# Patient Record
Sex: Female | Born: 1935 | Race: Black or African American | Hispanic: No | Marital: Single | Smoking: Never smoker
Health system: Southern US, Community
[De-identification: ages and names within clinical notes are randomized; demographics above are authoritative.]

## PROBLEM LIST (undated history)

## (undated) DIAGNOSIS — M1990 Unspecified osteoarthritis, unspecified site: Secondary | ICD-10-CM

## (undated) DIAGNOSIS — R634 Abnormal weight loss: Secondary | ICD-10-CM

## (undated) DIAGNOSIS — T8859XA Other complications of anesthesia, initial encounter: Secondary | ICD-10-CM

## (undated) DIAGNOSIS — N179 Acute kidney failure, unspecified: Secondary | ICD-10-CM

## (undated) DIAGNOSIS — R432 Parageusia: Secondary | ICD-10-CM

## (undated) DIAGNOSIS — IMO0001 Reserved for inherently not codable concepts without codable children: Secondary | ICD-10-CM

## (undated) DIAGNOSIS — T4145XA Adverse effect of unspecified anesthetic, initial encounter: Secondary | ICD-10-CM

## (undated) DIAGNOSIS — C539 Malignant neoplasm of cervix uteri, unspecified: Secondary | ICD-10-CM

## (undated) DIAGNOSIS — I10 Essential (primary) hypertension: Secondary | ICD-10-CM

## (undated) DIAGNOSIS — N2881 Hypertrophy of kidney: Secondary | ICD-10-CM

## (undated) DIAGNOSIS — K439 Ventral hernia without obstruction or gangrene: Secondary | ICD-10-CM

## (undated) HISTORY — DX: Malignant neoplasm of cervix uteri, unspecified: C53.9

## (undated) HISTORY — PX: TONSILLECTOMY: SUR1361

## (undated) HISTORY — DX: Parageusia: R43.2

## (undated) HISTORY — DX: Abnormal weight loss: R63.4

---

## 2006-09-07 ENCOUNTER — Emergency Department: Payer: Self-pay | Admitting: Emergency Medicine

## 2006-09-07 ENCOUNTER — Other Ambulatory Visit: Payer: Self-pay

## 2006-11-26 ENCOUNTER — Ambulatory Visit: Payer: Self-pay | Admitting: Family Medicine

## 2007-12-09 ENCOUNTER — Ambulatory Visit: Payer: Self-pay | Admitting: Family Medicine

## 2008-10-08 ENCOUNTER — Inpatient Hospital Stay: Payer: Self-pay | Admitting: Internal Medicine

## 2008-12-12 ENCOUNTER — Ambulatory Visit: Payer: Self-pay | Admitting: Family Medicine

## 2009-03-29 ENCOUNTER — Ambulatory Visit: Payer: Self-pay | Admitting: Family Medicine

## 2009-12-31 ENCOUNTER — Ambulatory Visit: Payer: Self-pay | Admitting: Family Medicine

## 2010-01-28 ENCOUNTER — Emergency Department: Payer: Self-pay | Admitting: Emergency Medicine

## 2010-06-12 ENCOUNTER — Ambulatory Visit: Payer: Self-pay | Admitting: Family Medicine

## 2011-03-19 ENCOUNTER — Ambulatory Visit: Payer: Self-pay | Admitting: Family Medicine

## 2012-04-15 ENCOUNTER — Ambulatory Visit: Payer: Self-pay | Admitting: Family Medicine

## 2013-08-17 ENCOUNTER — Ambulatory Visit: Payer: Self-pay | Admitting: Family Medicine

## 2014-04-21 DIAGNOSIS — R9431 Abnormal electrocardiogram [ECG] [EKG]: Secondary | ICD-10-CM | POA: Insufficient documentation

## 2014-04-21 DIAGNOSIS — I10 Essential (primary) hypertension: Secondary | ICD-10-CM | POA: Insufficient documentation

## 2014-04-21 DIAGNOSIS — R0681 Apnea, not elsewhere classified: Secondary | ICD-10-CM | POA: Insufficient documentation

## 2014-08-21 ENCOUNTER — Ambulatory Visit: Payer: Self-pay | Admitting: Internal Medicine

## 2014-09-09 ENCOUNTER — Encounter (HOSPITAL_COMMUNITY): Payer: Self-pay | Admitting: Emergency Medicine

## 2014-09-09 ENCOUNTER — Emergency Department (HOSPITAL_COMMUNITY)
Admission: EM | Admit: 2014-09-09 | Discharge: 2014-09-09 | Disposition: A | Discharge status: Home / Self Care | Payer: No Typology Code available for payment source | Attending: Emergency Medicine | Admitting: Emergency Medicine

## 2014-09-09 ENCOUNTER — Emergency Department (HOSPITAL_COMMUNITY): Payer: No Typology Code available for payment source

## 2014-09-09 DIAGNOSIS — Z79899 Other long term (current) drug therapy: Secondary | ICD-10-CM | POA: Diagnosis not present

## 2014-09-09 DIAGNOSIS — Y998 Other external cause status: Secondary | ICD-10-CM | POA: Diagnosis not present

## 2014-09-09 DIAGNOSIS — Z043 Encounter for examination and observation following other accident: Secondary | ICD-10-CM | POA: Diagnosis present

## 2014-09-09 DIAGNOSIS — Y92410 Unspecified street and highway as the place of occurrence of the external cause: Secondary | ICD-10-CM | POA: Diagnosis not present

## 2014-09-09 DIAGNOSIS — Z7982 Long term (current) use of aspirin: Secondary | ICD-10-CM | POA: Diagnosis not present

## 2014-09-09 DIAGNOSIS — Y9389 Activity, other specified: Secondary | ICD-10-CM | POA: Diagnosis not present

## 2014-09-09 DIAGNOSIS — I10 Essential (primary) hypertension: Secondary | ICD-10-CM | POA: Diagnosis not present

## 2014-09-09 HISTORY — DX: Essential (primary) hypertension: I10

## 2014-09-09 NOTE — ED Notes (Addendum)
Pt not in room to do discharge vitals/instructions. Pt gown on bed, not in restroom.
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
## 2014-09-09 NOTE — Discharge Instructions (Signed)

## 2015-05-14 ENCOUNTER — Emergency Department
Admission: EM | Admit: 2015-05-14 | Discharge: 2015-05-15 | Disposition: A | Discharge status: Home / Self Care | Payer: Medicare Other | Attending: Emergency Medicine | Admitting: Emergency Medicine

## 2015-05-14 ENCOUNTER — Encounter: Payer: Self-pay | Admitting: Emergency Medicine

## 2015-05-14 DIAGNOSIS — I10 Essential (primary) hypertension: Secondary | ICD-10-CM | POA: Diagnosis not present

## 2015-05-14 DIAGNOSIS — Z79899 Other long term (current) drug therapy: Secondary | ICD-10-CM | POA: Insufficient documentation

## 2015-05-14 DIAGNOSIS — M79604 Pain in right leg: Secondary | ICD-10-CM | POA: Diagnosis not present

## 2015-05-14 DIAGNOSIS — Z7982 Long term (current) use of aspirin: Secondary | ICD-10-CM | POA: Diagnosis not present

## 2015-05-14 DIAGNOSIS — R52 Pain, unspecified: Secondary | ICD-10-CM

## 2015-05-14 MED ORDER — KETOROLAC TROMETHAMINE 10 MG PO TABS
10.0000 mg | ORAL_TABLET | Freq: Once | ORAL | Status: AC
  Administered 2015-05-14: 10 mg via ORAL
  Filled 2015-05-14: qty 1

## 2015-05-14 NOTE — ED Notes (Addendum)
Pt to triage via w/c with no distress noted; pt reports having pain from right hip radiating down leg today with no known injury; denies hx of same, denies back pain; +PP, brisk cap refill, W&D, good movem/sensation; swelling not to ankle--but appears bilat; st pain increases with wt bearing

## 2015-05-15 ENCOUNTER — Emergency Department: Payer: Medicare Other

## 2015-05-15 MED ORDER — KETOROLAC TROMETHAMINE 10 MG PO TABS: 10.0000 mg | ORAL_TABLET | Freq: Three times a day (TID) | ORAL | Status: DC | PRN

## 2015-05-15 NOTE — Discharge Instructions (Signed)

## 2015-05-15 NOTE — ED Provider Notes (Signed)
[facility] Emergency Department Provider Note  ____________________________________________  Time seen: 11:40 PM  I have reviewed the triage vital signs and the nursing notes.   HISTORY  Chief Complaint Leg Pain      HPI Tonya Moreno is a 81 y.o. female presents with right hip pain that radiates down posterior leg. Pain is worse with ambulation but does occur with rest. Patient also admits to some linked to her ankle bilaterally. Patient denies any shortness of breath no chest pain.     Past Medical History  Diagnosis Date  . Hypertension     There are no active problems to display for this patient.   History reviewed. No pertinent past surgical history.  Current Outpatient Rx  Name  Route  Sig  Dispense  Refill  . amLODipine (NORVASC) 10 MG tablet   Oral   Take 10 mg by mouth daily.         Marland Kitchen aspirin 81 MG tablet   Oral   Take 81 mg by mouth daily.         . Cholecalciferol 4000 UNITS CAPS   Oral   Take 4,000 Units by mouth daily.         . quinapril (ACCUPRIL) 20 MG tablet   Oral   Take 20 mg by mouth 2 (two) times daily.           Allergies Review of patient's allergies indicates no known allergies.  No family history on file.  Social History History  Substance Use Topics  . Smoking status: Never Smoker   . Smokeless tobacco: Never Used  . Alcohol Use: No    Review of Systems  Constitutional: Negative for fever. Eyes: Negative for visual changes. ENT: Negative for sore throat. Cardiovascular: Negative for chest pain. Respiratory: Negative for shortness of breath. Gastrointestinal: Negative for abdominal pain, vomiting and diarrhea. Genitourinary: Negative for dysuria. Musculoskeletal: Negative for back pain. Positive for right leg pain. Skin: Negative for rash. Neurological: Negative for headaches, focal weakness or numbness.   10-point ROS otherwise  negative.  ____________________________________________   PHYSICAL EXAM:  VITAL SIGNS: ED Triage Vitals  Enc Vitals Group     BP 05/14/15 2214 162/70 mmHg     Pulse Rate 05/14/15 2214 73     Resp --      Temp 05/14/15 2214 98.5 F (36.9 C)     Temp Source 05/14/15 2214 Oral     SpO2 05/14/15 2214 99 %     Weight 05/14/15 2214 170 lb (77.111 kg)     Height 05/14/15 2214 5\' 5"  (1.651 m)     Head Cir --      Peak Flow --      Pain Score 05/14/15 2218 9     Pain Loc --      Pain Edu? --      Excl. in [place]? --      Constitutional: Alert and oriented. Well appearing and in no distress. Eyes: Conjunctivae are normal. PERRL. Normal extraocular movements. ENT   Head: Normocephalic and atraumatic.   Nose: No congestion/rhinnorhea.   Mouth/Throat: Mucous membranes are moist.   Neck: No stridor. Hematological/Lymphatic/Immunilogical: No cervical lymphadenopathy. Cardiovascular: Normal rate, regular rhythm. Normal and symmetric distal pulses are present in all extremities. No murmurs, rubs, or gallops. Respiratory: Normal respiratory effort without tachypnea nor retractions. Breath sounds are clear and equal bilaterally. No wheezes/rales/rhonchi. Gastrointestinal: Soft and nontender. No distention. There is no CVA tenderness. Genitourinary: deferred Musculoskeletal: Nontender  with normal range of motion in all extremities. No joint effusions.  Neurologic:  Normal speech and language. No gross focal neurologic deficits are appreciated. Speech is normal.  Skin:  Skin is warm, dry and intact. No rash noted. Psychiatric: Mood and affect are normal. Speech and behavior are normal. Patient exhibits appropriate insight and judgment.    RADIOLOGY  Right lower extremity ultrasound revealed: IMPRESSION: No evidence of deep venous thrombosis.   Electronically Signed By: Garald Balding M.D. On: 05/15/2015 02:14      Right hip x-ray revealed : IMPRESSION: No  evidence of fracture or dislocation.   Electronically Signed By: Garald Balding M.D. On: 05/15/2015 02:31  ____________________________________________    INITIAL IMPRESSION / ASSESSMENT AND PLAN / ED COURSE  Pertinent labs & imaging results that were available during my care of the patient were reviewed by me and considered in my medical decision making (see chart for details).  Patient received Toradol with resolution of pain.  ____________________________________________   FINAL CLINICAL IMPRESSION(S) / ED DIAGNOSES  Final diagnoses:  Pain  Leg pain, diffuse, right      Gregor Hams, MD 05/15/15 [phone]

## 2015-09-19 ENCOUNTER — Emergency Department
Admission: EM | Admit: 2015-09-19 | Discharge: 2015-09-19 | Disposition: A | Discharge status: Home / Self Care | Payer: Medicare Other | Attending: Emergency Medicine | Admitting: Emergency Medicine

## 2015-09-19 DIAGNOSIS — N3090 Cystitis, unspecified without hematuria: Secondary | ICD-10-CM

## 2015-09-19 DIAGNOSIS — N3091 Cystitis, unspecified with hematuria: Secondary | ICD-10-CM | POA: Diagnosis not present

## 2015-09-19 DIAGNOSIS — R319 Hematuria, unspecified: Secondary | ICD-10-CM | POA: Diagnosis present

## 2015-09-19 DIAGNOSIS — I10 Essential (primary) hypertension: Secondary | ICD-10-CM | POA: Insufficient documentation

## 2015-09-19 LAB — CBC WITH DIFFERENTIAL/PLATELET
BASOS ABS: 0 10*3/uL (ref 0–0.1)
BASOS PCT: 0 %
Eosinophils Absolute: 0.1 10*3/uL (ref 0–0.7)
Eosinophils Relative: 1 %
HEMATOCRIT: 37.1 % (ref 35.0–47.0)
HEMOGLOBIN: 11.9 g/dL — AB (ref 12.0–16.0)
LYMPHS PCT: 28 %
Lymphs Abs: 1.6 10*3/uL (ref 1.0–3.6)
MCH: 28.4 pg (ref 26.0–34.0)
MCHC: 32.1 g/dL (ref 32.0–36.0)
MCV: 88.5 fL (ref 80.0–100.0)
Monocytes Absolute: 0.6 10*3/uL (ref 0.2–0.9)
Monocytes Relative: 12 %
NEUTROS ABS: 3.3 10*3/uL (ref 1.4–6.5)
NEUTROS PCT: 59 %
Platelets: 223 10*3/uL (ref 150–440)
RBC: 4.2 MIL/uL (ref 3.80–5.20)
RDW: 14.4 % (ref 11.5–14.5)
WBC: 5.5 10*3/uL (ref 3.6–11.0)

## 2015-09-19 LAB — URINALYSIS COMPLETE WITH MICROSCOPIC (ARMC ONLY)
BILIRUBIN URINE: NEGATIVE
GLUCOSE, UA: NEGATIVE mg/dL
Nitrite: NEGATIVE
PROTEIN: 30 mg/dL — AB
Specific Gravity, Urine: 1.018 (ref 1.005–1.030)
pH: 6 (ref 5.0–8.0)

## 2015-09-19 LAB — BASIC METABOLIC PANEL
ANION GAP: 6 (ref 5–15)
BUN: 23 mg/dL — ABNORMAL HIGH (ref 6–20)
CO2: 28 mmol/L (ref 22–32)
Calcium: 10.4 mg/dL — ABNORMAL HIGH (ref 8.9–10.3)
Chloride: 105 mmol/L (ref 101–111)
Creatinine, Ser: 1.14 mg/dL — ABNORMAL HIGH (ref 0.44–1.00)
GFR calc non Af Amer: 45 mL/min — ABNORMAL LOW (ref >60)
GFR, EST AFRICAN AMERICAN: 52 mL/min — AB (ref >60)
GLUCOSE: 98 mg/dL (ref 65–99)
POTASSIUM: 3.6 mmol/L (ref 3.5–5.1)
Sodium: 139 mmol/L (ref 135–145)

## 2015-09-19 MED ORDER — SULFAMETHOXAZOLE-TRIMETHOPRIM 800-160 MG PO TABS: 1.0000 | ORAL_TABLET | Freq: Two times a day (BID) | ORAL | Status: DC

## 2015-09-19 MED ORDER — SULFAMETHOXAZOLE-TRIMETHOPRIM 800-160 MG PO TABS
1.0000 | ORAL_TABLET | Freq: Once | ORAL | Status: AC
  Administered 2015-09-19: 1 via ORAL
  Filled 2015-09-19: qty 1

## 2015-09-19 NOTE — ED Notes (Signed)
Pt states she noticed blood in her urine when today x1.. Denies pain or other sx

## 2015-09-19 NOTE — Discharge Instructions (Signed)

## 2015-09-19 NOTE — ED Provider Notes (Signed)
[facility] Emergency Department Provider Note     Time seen: ----------------------------------------- 4:55 PM on 09/19/2015 -----------------------------------------    I have reviewed the triage vital signs and the nursing notes.   HISTORY  Chief Complaint Hematuria    HPI Tonya Moreno is a 81 y.o. female who presents the ER actionin her urine when she urinated today. Patient states was blood when she white, she's not had a history of this, has not had any other recent bleeding episodes. She denies fevers, chills, chest pain, shortness of breath or any other complaints. Patient does not have dysuria or polyuria.   Past Medical History  Diagnosis Date  . Hypertension     There are no active problems to display for this patient.   No past surgical history on file.  Allergies Review of patient's allergies indicates no known allergies.  Social History Social History  Substance Use Topics  . Smoking status: Never Smoker   . Smokeless tobacco: Never Used  . Alcohol Use: No    Review of Systems Constitutional: Negative for fever. Eyes: Negative for visual changes. ENT: Negative for sore throat. Cardiovascular: Negative for chest pain. Respiratory: Negative for shortness of breath. Gastrointestinal: Negative for abdominal pain, vomiting and diarrhea. Genitourinary: Positive for hematuria Musculoskeletal: Negative for back pain. Skin: Negative for rash. Neurological: Negative for headaches, focal weakness or numbness.  10-point ROS otherwise negative.  ____________________________________________   PHYSICAL EXAM:  VITAL SIGNS: ED Triage Vitals  Enc Vitals Group     BP 09/19/15 1641 171/75 mmHg     Pulse Rate 09/19/15 1641 64     Resp 09/19/15 1641 18     Temp 09/19/15 1641 98.6 F (37 C)     Temp Source 09/19/15 1641 Oral     SpO2 09/19/15 1641 100 %     Weight 09/19/15 1641 170 lb (77.111 kg)     Height 09/19/15 1641  5\' 6"  (1.676 m)     Head Cir --      Peak Flow --      Pain Score --      Pain Loc --      Pain Edu? --      Excl. in [place]? --     Constitutional: Alert and oriented. Well appearing and in no distress. Eyes: Conjunctivae are normal. PERRL. Normal extraocular movements. ENT   Head: Normocephalic and atraumatic.   Nose: No congestion/rhinnorhea.   Mouth/Throat: Mucous membranes are moist.   Neck: No stridor. Cardiovascular: Normal rate, regular rhythm. Normal and symmetric distal pulses are present in all extremities. No murmurs, rubs, or gallops. Respiratory: Normal respiratory effort without tachypnea nor retractions. Breath sounds are clear and equal bilaterally. No wheezes/rales/rhonchi. Gastrointestinal: Soft and nontender. No distention. No abdominal bruits.  Musculoskeletal: Nontender with normal range of motion in all extremities. No joint effusions.  No lower extremity tenderness nor edema. Neurologic:  Normal speech and language. No gross focal neurologic deficits are appreciated. Speech is normal. No gait instability. Skin:  Skin is warm, dry and intact. No rash noted. Psychiatric: Mood and affect are normal. Speech and behavior are normal. Patient exhibits appropriate insight and judgment.  ____________________________________________  ED COURSE:  Pertinent labs & imaging results that were available during my care of the patient were reviewed by me and considered in my medical decision making (see chart for details). Patient's in no acute distress, will check basic labs and urine. ____________________________________________    LABS (pertinent positives/negatives)  Labs Reviewed  CBC WITH DIFFERENTIAL/PLATELET - Abnormal; Notable for the following:    Hemoglobin 11.9 (*)    All other components within normal limits  BASIC METABOLIC PANEL - Abnormal; Notable for the following:    BUN 23 (*)    Creatinine, Ser 1.14 (*)    Calcium 10.4 (*)    GFR calc non  Af Amer 45 (*)    GFR calc Af Amer 52 (*)    All other components within normal limits  URINALYSIS COMPLETEWITH MICROSCOPIC ([facility] ONLY) - Abnormal; Notable for the following:    Color, Urine YELLOW (*)    APPearance CLEAR (*)    Ketones, ur TRACE (*)    Hgb urine dipstick 3+ (*)    Protein, ur 30 (*)    Leukocytes, UA 2+ (*)    Bacteria, UA RARE (*)    Squamous Epithelial / LPF 0-5 (*)    All other components within normal limits  URINE CULTURE   ___________________________________________  FINAL ASSESSMENT AND PLAN  Hematuria  Plan: Patient with labs and imaging as dictated above. Patient said no acute distress, likely cause is cystitis from infection. She'll be given Septra any urine culture will be sent. She is stable for outpatient follow-up with her doctor.   Earleen Newport, MD   Earleen Newport, MD 09/19/15 [name]

## 2015-09-22 LAB — URINE CULTURE

## 2015-10-24 ENCOUNTER — Emergency Department: Payer: Medicare Other

## 2015-10-24 ENCOUNTER — Emergency Department
Admission: EM | Admit: 2015-10-24 | Discharge: 2015-10-24 | Disposition: A | Discharge status: Home / Self Care | Payer: Medicare Other | Attending: Emergency Medicine | Admitting: Emergency Medicine

## 2015-10-24 DIAGNOSIS — I10 Essential (primary) hypertension: Secondary | ICD-10-CM | POA: Insufficient documentation

## 2015-10-24 DIAGNOSIS — Z792 Long term (current) use of antibiotics: Secondary | ICD-10-CM | POA: Insufficient documentation

## 2015-10-24 DIAGNOSIS — R319 Hematuria, unspecified: Secondary | ICD-10-CM | POA: Insufficient documentation

## 2015-10-24 DIAGNOSIS — Z7982 Long term (current) use of aspirin: Secondary | ICD-10-CM | POA: Diagnosis not present

## 2015-10-24 DIAGNOSIS — Z79899 Other long term (current) drug therapy: Secondary | ICD-10-CM | POA: Insufficient documentation

## 2015-10-24 LAB — BASIC METABOLIC PANEL
ANION GAP: 8 (ref 5–15)
BUN: 26 mg/dL — ABNORMAL HIGH (ref 6–20)
CALCIUM: 10.7 mg/dL — AB (ref 8.9–10.3)
CO2: 27 mmol/L (ref 22–32)
CREATININE: 1.31 mg/dL — AB (ref 0.44–1.00)
Chloride: 103 mmol/L (ref 101–111)
GFR calc Af Amer: 44 mL/min — ABNORMAL LOW (ref >60)
GFR calc non Af Amer: 38 mL/min — ABNORMAL LOW (ref >60)
GLUCOSE: 128 mg/dL — AB (ref 65–99)
Potassium: 3.9 mmol/L (ref 3.5–5.1)
Sodium: 138 mmol/L (ref 135–145)

## 2015-10-24 LAB — URINALYSIS COMPLETE WITH MICROSCOPIC (ARMC ONLY)
Bacteria, UA: NONE SEEN
Bilirubin Urine: NEGATIVE
GLUCOSE, UA: NEGATIVE mg/dL
Ketones, ur: NEGATIVE mg/dL
Nitrite: NEGATIVE
Protein, ur: 100 mg/dL — AB
SQUAMOUS EPITHELIAL / LPF: NONE SEEN
Specific Gravity, Urine: 1.017 (ref 1.005–1.030)
pH: 5 (ref 5.0–8.0)

## 2015-10-24 LAB — CBC
HEMATOCRIT: 34.6 % — AB (ref 35.0–47.0)
Hemoglobin: 11.5 g/dL — ABNORMAL LOW (ref 12.0–16.0)
MCH: 28.8 pg (ref 26.0–34.0)
MCHC: 33.3 g/dL (ref 32.0–36.0)
MCV: 86.4 fL (ref 80.0–100.0)
PLATELETS: 208 10*3/uL (ref 150–440)
RBC: 4.01 MIL/uL (ref 3.80–5.20)
RDW: 15.4 % — AB (ref 11.5–14.5)
WBC: 7.1 10*3/uL (ref 3.6–11.0)

## 2015-10-24 MED ORDER — CIPROFLOXACIN HCL 500 MG PO TABS
500.0000 mg | ORAL_TABLET | Freq: Once | ORAL | Status: AC
  Administered 2015-10-24: 500 mg via ORAL
  Filled 2015-10-24: qty 1

## 2015-10-24 MED ORDER — CIPROFLOXACIN HCL 500 MG PO TABS: 500.0000 mg | ORAL_TABLET | Freq: Two times a day (BID) | ORAL | Status: AC

## 2015-10-24 NOTE — Discharge Instructions (Signed)

## 2015-10-24 NOTE — ED Provider Notes (Signed)
[facility] Emergency Department Provider Note  ____________________________________________  Time seen: 5:00 AM  I have reviewed the triage vital signs and the nursing notes.   HISTORY  Chief Complaint Hematuria      HPI [place] is a 81 y.o. female presents with bloody urine with onset tonight. Patient denies any dysuria and no back pain no fever no nausea or vomiting. Patient states that she had previous episode of the same and was diagnosed with a urinary tract infection with complete resolution of mature area with antibiotic therapy.    Past Medical History  Diagnosis Date  . Hypertension     There are no active problems to display for this patient.   No past surgical history on file.  Current Outpatient Rx  Name  Route  Sig  Dispense  Refill  . amLODipine (NORVASC) 10 MG tablet   Oral   Take 10 mg by mouth daily.         Marland Kitchen aspirin 81 MG tablet   Oral   Take 81 mg by mouth daily.         . Cholecalciferol 4000 UNITS CAPS   Oral   Take 4,000 Units by mouth daily.         . quinapril (ACCUPRIL) 20 MG tablet   Oral   Take 20 mg by mouth 2 (two) times daily.         Marland Kitchen ketorolac (TORADOL) 10 MG tablet   Oral   Take 1 tablet (10 mg total) by mouth every 8 (eight) hours as needed.   20 tablet   0   . sulfamethoxazole-trimethoprim (BACTRIM DS) 800-160 MG tablet   Oral   Take 1 tablet by mouth 2 (two) times daily.   20 tablet   0     Allergies Review of patient's allergies indicates no known allergies.  No family history on file.  Social History Social History  Substance Use Topics  . Smoking status: Never Smoker   . Smokeless tobacco: Never Used  . Alcohol Use: No    Review of Systems  Constitutional: Negative for fever. Eyes: Negative for visual changes. ENT: Negative for sore throat. Cardiovascular: Negative for chest pain. Respiratory: Negative for shortness of breath. Gastrointestinal:  Negative for abdominal pain, vomiting and diarrhea. Genitourinary: Positive for hematuria. Musculoskeletal: Negative for back pain. Skin: Negative for rash. Neurological: Negative for headaches, focal weakness or numbness.   10-point ROS otherwise negative.  ____________________________________________   PHYSICAL EXAM:  VITAL SIGNS: ED Triage Vitals  Enc Vitals Group     BP 10/24/15 0206 160/78 mmHg     Pulse Rate 10/24/15 0206 86     Resp 10/24/15 0206 18     Temp 10/24/15 0206 98.5 F (36.9 C)     Temp Source 10/24/15 0206 Oral     SpO2 10/24/15 0206 95 %     Weight 10/24/15 0206 150 lb (68.04 kg)     Height 10/24/15 0206 5\' 5"  (1.651 m)     Head Cir --      Peak Flow --      Pain Score 10/24/15 0434 0     Pain Loc --      Pain Edu? --      Excl. in [place]? --     Constitutional: Alert and oriented. Well appearing and in no distress. Eyes: Conjunctivae are normal. PERRL. Normal extraocular movements. ENT   Head: Normocephalic and atraumatic.   Nose: No congestion/rhinnorhea.  Mouth/Throat: Mucous membranes are moist.   Neck: No stridor. Hematological/Lymphatic/Immunilogical: No cervical lymphadenopathy. Cardiovascular: Normal rate, regular rhythm. Normal and symmetric distal pulses are present in all extremities. No murmurs, rubs, or gallops. Respiratory: Normal respiratory effort without tachypnea nor retractions. Breath sounds are clear and equal bilaterally. No wheezes/rales/rhonchi. Gastrointestinal: Soft and nontender. No distention. There is no CVA tenderness. Genitourinary: deferred Musculoskeletal: Nontender with normal range of motion in all extremities. No joint effusions.  No lower extremity tenderness nor edema. Neurologic:  Normal speech and language. No gross focal neurologic deficits are appreciated. Speech is normal.  Skin:  Skin is warm, dry and intact. No rash noted. Psychiatric: Mood and affect are normal. Speech and behavior are normal.  Patient exhibits appropriate insight and judgment.  ____________________________________________    LABS (pertinent positives/negatives)  Labs Reviewed  CBC - Abnormal; Notable for the following:    Hemoglobin 11.5 (*)    HCT 34.6 (*)    RDW 15.4 (*)    All other components within normal limits  BASIC METABOLIC PANEL - Abnormal; Notable for the following:    Glucose, Bld 128 (*)    BUN 26 (*)    Creatinine, Ser 1.31 (*)    Calcium 10.7 (*)    GFR calc non Af Amer 38 (*)    GFR calc Af Amer 44 (*)    All other components within normal limits  URINALYSIS COMPLETEWITH MICROSCOPIC ([facility] ONLY) - Abnormal; Notable for the following:    Color, Urine AMBER (*)    APPearance CLOUDY (*)    Hgb urine dipstick 3+ (*)    Protein, ur 100 (*)    Leukocytes, UA 2+ (*)    All other components within normal limits       RADIOLOGY     CT RENAL STONE STUDY (Final result) Result time: 10/24/15 06:31:25   Final result by Rad Results In Interface (10/24/15 06:31:25)   Narrative:   CLINICAL DATA: Acute onset of hematuria. Initial encounter.  EXAM: CT ABDOMEN AND PELVIS WITHOUT CONTRAST  TECHNIQUE: Multidetector CT imaging of the abdomen and pelvis was performed following the standard protocol without IV contrast.  COMPARISON: None.  FINDINGS: Minimal right basilar atelectasis is noted.  The liver and spleen are unremarkable in appearance. The gallbladder is within normal limits. The pancreas and adrenal glands are unremarkable.  There is chronic relatively severe right-sided hydronephrosis, with diffuse distention of the right ureter along most of its course. Distention resolves several centimeters above the right vesicoureteral junction, possibly reflecting a focal stricture. Underlying mass cannot be entirely excluded.  The left kidney is unremarkable in appearance. Mild nonspecific right-sided perinephric stranding is seen. No renal or ureteral stones are  identified.  No free fluid is identified. The small bowel is unremarkable in appearance. The stomach is within normal limits. No acute vascular abnormalities are seen.  The appendix is not definitely characterized; there is no evidence for appendicitis. Minimal diverticulosis is noted along the descending colon. The colon is unremarkable in appearance.  The bladder is moderately distended and grossly unremarkable. Scattered uterine fibroids are seen. A 3.6 cm hypodense focus at the uterine fundus may reflect a degenerating fibroid. The ovaries are relatively symmetric. No suspicious adnexal masses are seen. No inguinal lymphadenopathy is seen.  No acute osseous abnormalities are identified. Joint space narrowing is noted at the right hip, with subcortical cystic change. Multilevel endplate irregularity is noted along the lower thoracic and lumbar spine, with underlying facet disease.  IMPRESSION: 1. Chronic  relatively severe right-sided hydronephrosis, with diffuse distention of the right ureter along most of its course. Distention resolves several centimeters above the right vesicoureteral junction. This may reflect a focal stricture. Underlying mass cannot be excluded, especially given hematuria. Ureteroscopy could be considered for further evaluation, as deemed clinically appropriate. 2. Minimal diverticulosis along the descending colon, without evidence of diverticulitis. 3. Scattered uterine fibroids seen. 3.6 cm hypodense focus at the uterine fundus may reflect a degenerating fibroid, though pelvic ultrasound could be considered for further evaluation to exclude a more worrying mass, as deemed clinically appropriate. 4. Degenerative change at the right hip.   Electronically Signed By: Garald Balding M.D. On: 10/24/2015 06:31         INITIAL IMPRESSION / ASSESSMENT AND PLAN / ED COURSE  Pertinent labs & imaging results that were available during my care of  the patient were reviewed by me and considered in my medical decision making (see chart for details).    ____________________________________________   FINAL CLINICAL IMPRESSION(S) / ED DIAGNOSES  Final diagnoses:  Hematuria      Gregor Hams, MD 10/24/15 [phone]

## 2015-10-24 NOTE — ED Notes (Signed)
Pt in with co bloody urine tonight hx of the same and was told it was a UTI, pt has no complaints at this time.

## 2015-10-24 NOTE — ED Notes (Signed)
Patient transported to CT 

## 2015-10-25 ENCOUNTER — Encounter: Payer: Self-pay | Admitting: Urology

## 2015-10-25 ENCOUNTER — Telehealth: Payer: Self-pay | Admitting: Radiology

## 2015-10-25 ENCOUNTER — Ambulatory Visit (INDEPENDENT_AMBULATORY_CARE_PROVIDER_SITE_OTHER): Payer: Medicare Other | Admitting: Urology

## 2015-10-25 VITALS — BP 157/78 | HR 83 | Ht 65.0 in | Wt 148.9 lb

## 2015-10-25 DIAGNOSIS — N289 Disorder of kidney and ureter, unspecified: Secondary | ICD-10-CM

## 2015-10-25 DIAGNOSIS — N1330 Unspecified hydronephrosis: Secondary | ICD-10-CM

## 2015-10-25 DIAGNOSIS — R310 Gross hematuria: Secondary | ICD-10-CM | POA: Diagnosis not present

## 2015-10-25 LAB — URINALYSIS, COMPLETE
Bilirubin, UA: NEGATIVE
Glucose, UA: NEGATIVE
NITRITE UA: NEGATIVE
SPEC GRAV UA: 1.02 (ref 1.005–1.030)
UUROB: 4 mg/dL — AB (ref 0.2–1.0)
pH, UA: 6.5 (ref 5.0–7.5)

## 2015-10-25 LAB — MICROSCOPIC EXAMINATION: Epithelial Cells (non renal): NONE SEEN /hpf (ref 0–10)

## 2015-10-25 NOTE — Telephone Encounter (Signed)
Notified pt of surgery scheduled 11/13/15, pre-admit testing appt on 10/30/15 @2 :45 and to call day prior to surgery for arrival time to SDS. Pt voices understanding.

## 2015-10-25 NOTE — Progress Notes (Signed)
.  10/25/2015 2:27 PM   [place] 06-Oct-1934 [id number]  Referring provider: Letta Median, MD [place], [place] [address]  Chief Complaint  Patient presents with  . Hematuria    NEw Patient    HPI: 81 yo F with presents today for evaluation of recurrent episodes of asymptomatic gross hematuria 2. She was seen yesterday in the emergency room for one such episode and also one month prior to that as well. She was treated for presumed urinary tract infections although UAs were not particularly suspicious for this and urine cultures have been negative. He denied any associated urinary symptoms including urinary urgency, frequency, dysuria, or fevers.  As part of her workup, she did have a CT stone protocol which showed somewhat chronic-appearing right hydroureteronephrosis down to the distal ureter. She has no previous knowledge of having hydronephrosis or workup for this. Previous abdominal surgeries or pelvic radiation. She denies any flank pain.  Creatinine slightly elevated to 1.3 from previous 1.0 in the emergency room yesterday.  Formal social smoker, has not smoked in many years.     PMH: Past Medical History  Diagnosis Date  . Hypertension     Surgical History: Past Surgical History  Procedure Laterality Date  . Tonsillectomy      Home Medications:    Medication List       This list is accurate as of: 10/25/15  2:27 PM.  Always use your most recent med list.               amLODipine 10 MG tablet  Commonly known as:  NORVASC  Take 10 mg by mouth daily.     aspirin 81 MG tablet  Take 81 mg by mouth daily.     Cholecalciferol 4000 units Caps  Take 4,000 Units by mouth daily.     ciprofloxacin 500 MG tablet  Commonly known as:  CIPRO  Take 1 tablet (500 mg total) by mouth 2 (two) times daily.     ketorolac 10 MG tablet  Commonly known as:  TORADOL  Take 1 tablet (10 mg total) by mouth every 8 (eight) hours as  needed.     quinapril 20 MG tablet  Commonly known as:  ACCUPRIL  Take 20 mg by mouth 2 (two) times daily.     sulfamethoxazole-trimethoprim 800-160 MG tablet  Commonly known as:  BACTRIM DS  Take 1 tablet by mouth 2 (two) times daily.        Allergies: No Known Allergies  Family History: Family History  Problem Relation Age of Onset  . Cancer Mother   . Prostate cancer Neg Hx   . Bladder Cancer Neg Hx   . Kidney cancer Neg Hx     Social History:  reports that she has never smoked. She has never used smokeless tobacco. She reports that she does not drink alcohol or use illicit drugs.  ROS: UROLOGY Frequent Urination?: No Hard to postpone urination?: No Burning/pain with urination?: No Get up at night to urinate?: Yes Leakage of urine?: Yes Urine stream starts and stops?: Yes Trouble starting stream?: No Do you have to strain to urinate?: No Blood in urine?: Yes Urinary tract infection?: No Sexually transmitted disease?: No Injury to kidneys or bladder?: No Painful intercourse?: No Weak stream?: No Currently pregnant?: No Vaginal bleeding?: No Last menstrual period?: n  Gastrointestinal Nausea?: No Vomiting?: No Indigestion/heartburn?: No Diarrhea?: No Constipation?: No  Constitutional Fever: No Night sweats?: No Weight loss?: No  Fatigue?: No  Skin Skin rash/lesions?: No Itching?: No  Eyes Blurred vision?: No Double vision?: No  Ears/Nose/Throat Sore throat?: No Sinus problems?: No  Hematologic/Lymphatic Swollen glands?: No Easy bruising?: No  Cardiovascular Leg swelling?: No Chest pain?: No  Respiratory Cough?: No Shortness of breath?: No  Endocrine Excessive thirst?: No  Musculoskeletal Back pain?: No Joint pain?: No  Neurological Headaches?: No Dizziness?: No  Psychologic Depression?: No Anxiety?: No  Physical Exam: BP 157/78 mmHg  Pulse 83  Ht 5\' 5"  (1.651 m)  Wt 148 lb 14.4 oz (67.541 kg)  BMI 24.78 kg/m2    Constitutional:  Alert and oriented, No acute distress. HEENT: [place] AT, moist mucus membranes.  Trachea midline, no masses. Cardiovascular: No clubbing, cyanosis, or edema.  RRR. Respiratory: Normal respiratory effort, no increased work of breathing. CTAB. GI: Abdomen is soft, nontender, nondistended, no abdominal masses GU: No CVA tenderness.  Skin: No rashes, bruises or suspicious lesions. Lymph: No cervical or inguinal adenopathy. Neurologic: Grossly intact, no focal deficits, moving all 4 extremities. Psychiatric: Normal mood and affect.  Laboratory Data: Lab Results  Component Value Date   WBC 7.1 10/24/2015   HGB 11.5* 10/24/2015   HCT 34.6* 10/24/2015   MCV 86.4 10/24/2015   PLT 208 10/24/2015    Lab Results  Component Value Date   CREATININE 1.31* 10/24/2015    Urinalysis    Component Value Date/Time   COLORURINE AMBER* 10/24/2015 0219   APPEARANCEUR CLOUDY* 10/24/2015 0219   LABSPEC 1.017 10/24/2015 0219   PHURINE 5.0 10/24/2015 0219   GLUCOSEU Negative 10/25/2015 0950   HGBUR 3+* 10/24/2015 0219   BILIRUBINUR Negative 10/25/2015 0950   BILIRUBINUR NEGATIVE 10/24/2015 0219   KETONESUR NEGATIVE 10/24/2015 0219   PROTEINUR 100* 10/24/2015 0219   NITRITE Negative 10/25/2015 0950   NITRITE NEGATIVE 10/24/2015 0219   LEUKOCYTESUR Trace* 10/25/2015 0950   LEUKOCYTESUR 2+* 10/24/2015 0219    Pertinent Imaging:  CLINICAL DATA: Acute onset of hematuria. Initial encounter.  EXAM: CT ABDOMEN AND PELVIS WITHOUT CONTRAST  TECHNIQUE: Multidetector CT imaging of the abdomen and pelvis was performed following the standard protocol without IV contrast.  COMPARISON: None.  FINDINGS: Minimal right basilar atelectasis is noted.  The liver and spleen are unremarkable in appearance. The gallbladder is within normal limits. The pancreas and adrenal glands are unremarkable.  There is chronic relatively severe right-sided hydronephrosis, with diffuse  distention of the right ureter along most of its course. Distention resolves several centimeters above the right vesicoureteral junction, possibly reflecting a focal stricture. Underlying mass cannot be entirely excluded.  The left kidney is unremarkable in appearance. Mild nonspecific right-sided perinephric stranding is seen. No renal or ureteral stones are identified.  No free fluid is identified. The small bowel is unremarkable in appearance. The stomach is within normal limits. No acute vascular abnormalities are seen.  The appendix is not definitely characterized; there is no evidence for appendicitis. Minimal diverticulosis is noted along the descending colon. The colon is unremarkable in appearance.  The bladder is moderately distended and grossly unremarkable. Scattered uterine fibroids are seen. A 3.6 cm hypodense focus at the uterine fundus may reflect a degenerating fibroid. The ovaries are relatively symmetric. No suspicious adnexal masses are seen. No inguinal lymphadenopathy is seen.  No acute osseous abnormalities are identified. Joint space narrowing is noted at the right hip, with subcortical cystic change. Multilevel endplate irregularity is noted along the lower thoracic and lumbar spine, with underlying facet disease.  IMPRESSION: 1. Chronic relatively severe  right-sided hydronephrosis, with diffuse distention of the right ureter along most of its course. Distention resolves several centimeters above the right vesicoureteral junction. This may reflect a focal stricture. Underlying mass cannot be excluded, especially given hematuria. Ureteroscopy could be considered for further evaluation, as deemed clinically appropriate. 2. Minimal diverticulosis along the descending colon, without evidence of diverticulitis. 3. Scattered uterine fibroids seen. 3.6 cm hypodense focus at the uterine fundus may reflect a degenerating fibroid, though pelvic ultrasound  could be considered for further evaluation to exclude a more worrying mass, as deemed clinically appropriate. 4. Degenerative change at the right hip.   Electronically Signed  By: Garald Balding M.D.  On: 10/24/2015 06:31     Assessment & Plan:  81 year old female with recurrent asymptomatic gross hematuria, severe right chronic-appearing hydroureteronephrosis of unclear etiology. I do have concern for possible intraluminal obstructing ureteral tumor. I recommended further workup with cystoscopy, right ureteroscopy, bilateral retrograde, with possible intervention/ stent in the operating room. We discussed risk and benefits of this. All of her questions were answered today.  1. Gross hematuria As above, further workup in the OR. Advised to stop aspirin 81 mg prior to surgery. - Urinalysis, Complete  2. Hydronephrosis, right As above.  3. Renal insufficiency Creatinine slightly elevated from previous baseline, we'll continue to monitor.   No Follow-up on file.  Hollice Espy, MD  [facility] [address], [place], [place] [address] [phone]

## 2015-10-30 ENCOUNTER — Other Ambulatory Visit: Payer: Medicare Other

## 2015-10-30 ENCOUNTER — Telehealth: Payer: Self-pay | Admitting: Radiology

## 2015-10-30 NOTE — Telephone Encounter (Signed)
Notified pt of missed pre-admit testing appt on 1/3 has been r/s to 1/5 @10 :30. Pt voices understanding.

## 2015-11-01 ENCOUNTER — Encounter
Admission: RE | Admit: 2015-11-01 | Discharge: 2015-11-01 | Disposition: A | Discharge status: Home / Self Care | Payer: Medicare Other | Source: Ambulatory Visit | Attending: Urology | Admitting: Urology

## 2015-11-01 DIAGNOSIS — Z01810 Encounter for preprocedural cardiovascular examination: Secondary | ICD-10-CM | POA: Diagnosis present

## 2015-11-01 HISTORY — DX: Unspecified osteoarthritis, unspecified site: M19.90

## 2015-11-01 HISTORY — DX: Adverse effect of unspecified anesthetic, initial encounter: T41.45XA

## 2015-11-01 HISTORY — DX: Reserved for inherently not codable concepts without codable children: IMO0001

## 2015-11-01 HISTORY — DX: Other complications of anesthesia, initial encounter: T88.59XA

## 2015-11-01 NOTE — Pre-Procedure Instructions (Signed)
PATIENT VERY ANXIOUS ABOUT MASK IN OR. PLEASE SEDATE PRIOR TO MASK IF POSSIBLE

## 2015-11-01 NOTE — Patient Instructions (Signed)
  Your procedure is scheduled on: 11/13/15 Report to Day Surgery.[facility] SECOND FLOOR To find out your arrival time please call [phone] between 1PM - 3PM on 11/12/15.  Remember: Instructions that are not followed completely may result in serious medical risk, up to and including death, or upon the discretion of your surgeon and anesthesiologist your surgery may need to be rescheduled.    _X___ 1. Do not eat food or drink liquids after midnight. No gum chewing or hard candies.     _X___ 2. No Alcohol for 24 hours before or after surgery.   ____ 3. Bring all medications with you on the day of surgery if instructed.    __X__ 4. Notify your doctor if there is any change in your medical condition     (cold, fever, infections).     Do not wear jewelry, make-up, hairpins, clips or nail polish.  Do not wear lotions, powders, or perfumes. You may wear deodorant.  Do not shave 48 hours prior to surgery. Men may shave face and neck.  Do not bring valuables to the hospital.    [facility] is not responsible for any belongings or valuables.               Contacts, dentures or bridgework may not be worn into surgery.  Leave your suitcase in the car. After surgery it may be brought to your room.  For patients admitted to the hospital, discharge time is determined by your                treatment team.   Patients discharged the day of surgery will not be allowed to drive home.   Please read over the following fact sheets that you were given:   Surgical Site Infection Prevention   ____ Take these medicines the morning of surgery with A SIP OF WATER:    1. AMLODIPINE  2. ACCUPRIL  3.   4.  5.  6.  ____ Fleet Enema (as directed)   ____ Use CHG Soap as directed  ____ Use inhalers on the day of surgery  ____ Stop metformin 2 days prior to surgery    ____ Take 1/2 of usual insulin dose the night before surgery and none on the morning of surgery.   _X___ Stop  Coumadin/Plavix/aspirin on   STOP ASPIRIN AS INSTRUCTED ____ Stop Anti-inflammatories on   ____ Stop supplements until after surgery.    ____ Bring C-Pap to the hospital.

## 2015-11-13 ENCOUNTER — Ambulatory Visit: Admission: RE | Admit: 2015-11-13 | Payer: Medicare Other | Source: Ambulatory Visit | Admitting: Urology

## 2015-11-13 ENCOUNTER — Encounter: Admission: RE | Payer: Self-pay | Source: Ambulatory Visit

## 2015-11-13 SURGERY — CYSTOSCOPY/URETEROSCOPY/HOLMIUM LASER/STENT PLACEMENT
Anesthesia: Choice | Laterality: Right

## 2015-11-14 ENCOUNTER — Telehealth: Payer: Self-pay | Admitting: Radiology

## 2015-11-14 NOTE — Telephone Encounter (Signed)
Discussed with pt's daughter that she had cancelled surgery & Dr Erlene Quan feels she needs to r/s. Explained that if pt has questions we can schedule an office visit prior to rescheduling. Daughter voices understanding & states she will discuss this with pt.

## 2015-11-27 ENCOUNTER — Telehealth: Payer: Self-pay | Admitting: Radiology

## 2015-11-27 ENCOUNTER — Encounter: Payer: Self-pay | Admitting: Urology

## 2015-11-27 ENCOUNTER — Ambulatory Visit (INDEPENDENT_AMBULATORY_CARE_PROVIDER_SITE_OTHER): Payer: Medicare Other | Admitting: Urology

## 2015-11-27 VITALS — BP 175/89 | HR 84 | Ht 65.0 in | Wt 145.9 lb

## 2015-11-27 DIAGNOSIS — N1330 Unspecified hydronephrosis: Secondary | ICD-10-CM | POA: Diagnosis not present

## 2015-11-27 DIAGNOSIS — N289 Disorder of kidney and ureter, unspecified: Secondary | ICD-10-CM

## 2015-11-27 DIAGNOSIS — R310 Gross hematuria: Secondary | ICD-10-CM | POA: Diagnosis not present

## 2015-11-27 LAB — URINALYSIS, COMPLETE
BILIRUBIN UA: NEGATIVE
GLUCOSE, UA: NEGATIVE
KETONES UA: NEGATIVE
Nitrite, UA: NEGATIVE
SPEC GRAV UA: 1.015 (ref 1.005–1.030)
UUROB: 0.2 mg/dL (ref 0.2–1.0)
pH, UA: 5.5 (ref 5.0–7.5)

## 2015-11-27 LAB — MICROSCOPIC EXAMINATION: RBC, UA: >30 /hpf — ABNORMAL HIGH (ref 0–?)

## 2015-11-27 NOTE — Progress Notes (Signed)
.   1:22 PM  11/27/2015   Tonya Moreno 25-Jan-1934 [id number]  Referring provider: Letta Median, MD [place], [place] [address]  Chief Complaint  Patient presents with  . Nephrolithiasis    discuss surgery    HPI: 82 yo F with scheduled for cystoscopy, bilateral retrograde pyelogram, right ureteroscopy with possible intervention/ureteral stent placement who canceled surgery due to various concerns. She returns to the office today with her family to discuss the procedure again and to address all of her concerns. She  is primarily concerned today about anesthesia.  She was confused and thought that her surgery was going to happen today.  She initially presented on 10/25/15 for evaluation of recurrent episodes of asymptomatic gross hematuria 2. She was seen in the emergency room for one such episode and also a month prior to that as well. She was treated for presumed urinary tract infections although UAs were not particularly suspicious for this and urine cultures have been negative. He denied any associated urinary symptoms including urinary urgency, frequency, dysuria, or fevers.  As part of her workup, she did have a CT stone protocol which showed somewhat chronic-appearing right hydroureteronephrosis down to the distal ureter. She has no previous knowledge of having hydronephrosis or workup for this. Previous abdominal surgeries or pelvic radiation. She denies any flank pain.  Creatinine slightly elevated to 1.3 from previous 1.0 in the emergency room yesterday.  Formal social smoker, has not smoked in many years.     PMH: Past Medical History  Diagnosis Date  . Hypertension   . Shortness of breath dyspnea     doe  . Chronic kidney disease   . Arthritis   . Complication of anesthesia     tonsillectomy age 62 bad experience with mask and hard to wake up    Surgical History: Past Surgical History  Procedure Laterality Date  .  Tonsillectomy      Home Medications:    Medication List       This list is accurate as of: 11/27/15  1:22 PM.  Always use your most recent med list.               amLODipine 10 MG tablet  Commonly known as:  NORVASC  Take 10 mg by mouth daily.     aspirin 81 MG tablet  Take 81 mg by mouth daily.     Cholecalciferol 4000 units Caps  Take 4,000 Units by mouth daily.     ketorolac 10 MG tablet  Commonly known as:  TORADOL  Take 1 tablet (10 mg total) by mouth every 8 (eight) hours as needed.     quinapril 20 MG tablet  Commonly known as:  ACCUPRIL  Take 20 mg by mouth 2 (two) times daily.        Allergies:  Allergies  Allergen Reactions  . Hydralazine Palpitations  . Quinapril     Other reaction(s): Unknown    Family History: Family History  Problem Relation Age of Onset  . Cancer Mother   . Prostate cancer Neg Hx   . Bladder Cancer Neg Hx   . Kidney cancer Neg Hx     Social History:  reports that she has never smoked. She has never used smokeless tobacco. She reports that she does not drink alcohol or use illicit drugs.  ROS: UROLOGY Frequent Urination?: No Hard to postpone urination?: No Burning/pain with urination?: No Get up at night to urinate?: No Leakage of  urine?: Yes Urine stream starts and stops?: No Trouble starting stream?: No Do you have to strain to urinate?: No Blood in urine?: No Urinary tract infection?: No Sexually transmitted disease?: No Injury to kidneys or bladder?: No Painful intercourse?: No Weak stream?: No Currently pregnant?: No Vaginal bleeding?: No Last menstrual period?: n  Gastrointestinal Nausea?: No Vomiting?: No Indigestion/heartburn?: No Diarrhea?: No Constipation?: No  Constitutional Fever: No Night sweats?: No Weight loss?: No Fatigue?: No  Skin Skin rash/lesions?: No Itching?: No  Eyes Blurred vision?: No Double vision?: No  Ears/Nose/Throat Sore throat?: No Sinus problems?:  No  Hematologic/Lymphatic Swollen glands?: No Easy bruising?: No  Cardiovascular Leg swelling?: No Chest pain?: No  Respiratory Cough?: No Shortness of breath?: Yes  Endocrine Excessive thirst?: No  Musculoskeletal Back pain?: No Joint pain?: No  Neurological Headaches?: No Dizziness?: No  Psychologic Depression?: No Anxiety?: No  Physical Exam: BP 175/89 mmHg  Pulse 84  Ht 5\' 5"  (1.651 m)  Wt 145 lb 14.4 oz (66.18 kg)  BMI 24.28 kg/m2  Constitutional:  Alert and oriented, No acute distress.  Multiple family members present today with Tonya Moreno including her children. HEENT: [place] AT, moist mucus membranes.  Trachea midline, no masses. Cardiovascular: No clubbing, cyanosis, or edema.  RRR. Respiratory: Normal respiratory effort, no increased work of breathing. CTAB. GI: Abdomen is soft, nontender, nondistended, no abdominal masses GU: No CVA tenderness.  Skin: No rashes, bruises or suspicious lesions. Neurologic: Grossly intact, no focal deficits, moving all 4 extremities. Psychiatric: Normal mood and affect.  Laboratory Data: Lab Results  Component Value Date   WBC 7.1 10/24/2015   HGB 11.5* 10/24/2015   HCT 34.6* 10/24/2015   MCV 86.4 10/24/2015   PLT 208 10/24/2015    Lab Results  Component Value Date   CREATININE 1.31* 10/24/2015    Urinalysis UA today with greater than 30 red blood cells, 0-5 white blood cells, many bacteria, nitrite negative.    Pertinent Imaging:  CLINICAL DATA: Acute onset of hematuria. Initial encounter.  EXAM: CT ABDOMEN AND PELVIS WITHOUT CONTRAST  TECHNIQUE: Multidetector CT imaging of the abdomen and pelvis was performed following the standard protocol without IV contrast.  COMPARISON: None.  FINDINGS: Minimal right basilar atelectasis is noted.  The liver and spleen are unremarkable in appearance. The gallbladder is within normal limits. The pancreas and adrenal glands are unremarkable.  There is  chronic relatively severe right-sided hydronephrosis, with diffuse distention of the right ureter along most of its course. Distention resolves several centimeters above the right vesicoureteral junction, possibly reflecting a focal stricture. Underlying mass cannot be entirely excluded.  The left kidney is unremarkable in appearance. Mild nonspecific right-sided perinephric stranding is seen. No renal or ureteral stones are identified.  No free fluid is identified. The small bowel is unremarkable in appearance. The stomach is within normal limits. No acute vascular abnormalities are seen.  The appendix is not definitely characterized; there is no evidence for appendicitis. Minimal diverticulosis is noted along the descending colon. The colon is unremarkable in appearance.  The bladder is moderately distended and grossly unremarkable. Scattered uterine fibroids are seen. A 3.6 cm hypodense focus at the uterine fundus may reflect a degenerating fibroid. The ovaries are relatively symmetric. No suspicious adnexal masses are seen. No inguinal lymphadenopathy is seen.  No acute osseous abnormalities are identified. Joint space narrowing is noted at the right hip, with subcortical cystic change. Multilevel endplate irregularity is noted along the lower thoracic and lumbar spine, with underlying  facet disease.  IMPRESSION: 1. Chronic relatively severe right-sided hydronephrosis, with diffuse distention of the right ureter along most of its course. Distention resolves several centimeters above the right vesicoureteral junction. This may reflect a focal stricture. Underlying mass cannot be excluded, especially given hematuria. Ureteroscopy could be considered for further evaluation, as deemed clinically appropriate. 2. Minimal diverticulosis along the descending colon, without evidence of diverticulitis. 3. Scattered uterine fibroids seen. 3.6 cm hypodense focus at the uterine  fundus may reflect a degenerating fibroid, though pelvic ultrasound could be considered for further evaluation to exclude a more worrying mass, as deemed clinically appropriate. 4. Degenerative change at the right hip.   Electronically Signed  By: Garald Balding M.D.  On: 10/24/2015 06:31     Assessment & Plan:  82 year old female with recurrent asymptomatic gross hematuria, severe right chronic-appearing hydroureteronephrosis of unclear etiology. I do have concern for possible intraluminal obstructing ureteral tumor. I recommended further workup with cystoscopy, right ureteroscopy, bilateral retrograde, with possible intervention/ stent in the operating room. We discussed risk and benefits of this.  Risk of anesthesia was also discussed today. All of her questions were answered again today but with family present.  She seems to fully understand the reason for surgery today and has agreed to reschedule. She understands the importance of close follow-up of this.  1. Gross hematuria As above, further workup in the OR. Advised to stop aspirin 81 mg prior to surgery. - Urinalysis, Complete -repeat UCx preop  2. Hydronephrosis, right As above.  3. Renal insufficiency Creatinine slightly elevated from previous baseline, we'll continue to monitor.   Hollice Espy, MD  [facility] [address] [place], [place] [address] [phone]

## 2015-11-27 NOTE — Telephone Encounter (Signed)
Pt's son, Legrand Como, notified of surgery scheduled 12/24/15, pre-admit testing appt on 12/14/15 @9 :30 and to call Friday prior to surgery for arrival time to [facility]. Advised that pt should hold ASA 81mg  and Toradol prior to surgery with last dose being taken on 12/16/15. Legrand Como voices understanding.

## 2015-11-29 LAB — CULTURE, URINE COMPREHENSIVE

## 2015-11-30 ENCOUNTER — Telehealth: Payer: Self-pay

## 2015-11-30 NOTE — Telephone Encounter (Signed)
Pt daughter called stating pt started bleeding again last night. Daughter and pt were unsure if there was anything that could be done at this point but wanted to make you aware.

## 2015-12-01 NOTE — Telephone Encounter (Signed)
Amy,  Can you see if this lady would like to reschedule to a sooner date for her procedure?    Hollice Espy, MD

## 2015-12-03 NOTE — Telephone Encounter (Signed)
LMOM. Need to notify of new surgery/ pre-admit testing dates.

## 2015-12-03 NOTE — Telephone Encounter (Signed)
Notified pt's daughter, April, of surgery r/s to 2/13, pre-admit testing appt r/s to 2/8 @11 :00 & to call Friday prior to surgery for arrival time to [facility]. Advised April that pt should hold ASA 81 mg & Toradol beginning 2/7. April voices understanding.

## 2015-12-04 ENCOUNTER — Encounter: Payer: Self-pay | Admitting: Emergency Medicine

## 2015-12-04 ENCOUNTER — Emergency Department
Admission: EM | Admit: 2015-12-04 | Discharge: 2015-12-04 | Disposition: A | Discharge status: Home / Self Care | Payer: Medicare Other | Attending: Emergency Medicine | Admitting: Emergency Medicine

## 2015-12-04 DIAGNOSIS — Z7982 Long term (current) use of aspirin: Secondary | ICD-10-CM | POA: Insufficient documentation

## 2015-12-04 DIAGNOSIS — N3090 Cystitis, unspecified without hematuria: Secondary | ICD-10-CM | POA: Insufficient documentation

## 2015-12-04 DIAGNOSIS — K4090 Unilateral inguinal hernia, without obstruction or gangrene, not specified as recurrent: Secondary | ICD-10-CM | POA: Diagnosis not present

## 2015-12-04 DIAGNOSIS — I129 Hypertensive chronic kidney disease with stage 1 through stage 4 chronic kidney disease, or unspecified chronic kidney disease: Secondary | ICD-10-CM | POA: Diagnosis not present

## 2015-12-04 DIAGNOSIS — N189 Chronic kidney disease, unspecified: Secondary | ICD-10-CM | POA: Insufficient documentation

## 2015-12-04 DIAGNOSIS — K439 Ventral hernia without obstruction or gangrene: Secondary | ICD-10-CM

## 2015-12-04 DIAGNOSIS — Z79899 Other long term (current) drug therapy: Secondary | ICD-10-CM | POA: Diagnosis not present

## 2015-12-04 DIAGNOSIS — R1032 Left lower quadrant pain: Secondary | ICD-10-CM | POA: Diagnosis present

## 2015-12-04 HISTORY — DX: Ventral hernia without obstruction or gangrene: K43.9

## 2015-12-04 LAB — COMPREHENSIVE METABOLIC PANEL
ALT: 12 U/L — ABNORMAL LOW (ref 14–54)
AST: 20 U/L (ref 15–41)
Albumin: 3.8 g/dL (ref 3.5–5.0)
Alkaline Phosphatase: 86 U/L (ref 38–126)
Anion gap: 10 (ref 5–15)
BILIRUBIN TOTAL: 0.7 mg/dL (ref 0.3–1.2)
BUN: 26 mg/dL — AB (ref 6–20)
CHLORIDE: 103 mmol/L (ref 101–111)
CO2: 22 mmol/L (ref 22–32)
CREATININE: 1.84 mg/dL — AB (ref 0.44–1.00)
Calcium: 9.9 mg/dL (ref 8.9–10.3)
GFR, EST AFRICAN AMERICAN: 29 mL/min — AB (ref >60)
GFR, EST NON AFRICAN AMERICAN: 25 mL/min — AB (ref >60)
Glucose, Bld: 119 mg/dL — ABNORMAL HIGH (ref 65–99)
POTASSIUM: 3.8 mmol/L (ref 3.5–5.1)
SODIUM: 135 mmol/L (ref 135–145)
TOTAL PROTEIN: 7.1 g/dL (ref 6.5–8.1)

## 2015-12-04 LAB — CBC
HEMATOCRIT: 31.4 % — AB (ref 35.0–47.0)
Hemoglobin: 10.4 g/dL — ABNORMAL LOW (ref 12.0–16.0)
MCH: 29.1 pg (ref 26.0–34.0)
MCHC: 33.2 g/dL (ref 32.0–36.0)
MCV: 87.8 fL (ref 80.0–100.0)
PLATELETS: 214 10*3/uL (ref 150–440)
RBC: 3.57 MIL/uL — AB (ref 3.80–5.20)
RDW: 15.3 % — AB (ref 11.5–14.5)
WBC: 8.1 10*3/uL (ref 3.6–11.0)

## 2015-12-04 LAB — URINALYSIS COMPLETE WITH MICROSCOPIC (ARMC ONLY)
BILIRUBIN URINE: NEGATIVE
GLUCOSE, UA: NEGATIVE mg/dL
Ketones, ur: NEGATIVE mg/dL
NITRITE: NEGATIVE
PH: 6 (ref 5.0–8.0)
Protein, ur: NEGATIVE mg/dL
SPECIFIC GRAVITY, URINE: 1.006 (ref 1.005–1.030)

## 2015-12-04 LAB — LIPASE, BLOOD: LIPASE: 19 U/L (ref 11–51)

## 2015-12-04 MED ORDER — CEPHALEXIN 500 MG PO CAPS: 500.0000 mg | ORAL_CAPSULE | Freq: Two times a day (BID) | ORAL | Status: DC

## 2015-12-04 MED ORDER — DOCUSATE SODIUM 100 MG PO CAPS: 200.0000 mg | ORAL_CAPSULE | Freq: Two times a day (BID) | ORAL | Status: DC

## 2015-12-04 MED ORDER — CEPHALEXIN 500 MG PO CAPS
500.0000 mg | ORAL_CAPSULE | Freq: Once | ORAL | Status: AC
  Administered 2015-12-04: 500 mg via ORAL
  Filled 2015-12-04 (×2): qty 1

## 2015-12-04 NOTE — ED Provider Notes (Signed)
[facility] Emergency Department Provider Note  ____________________________________________  Time seen: 9:45 PM  I have reviewed the triage vital signs and the nursing notes.   HISTORY  Chief Complaint Abdominal Pain    HPI Tonya Moreno is a 82 y.o. female complaining of left lower quadrant abdominal pain starting yesterday, intermittent. Normal bowel movements the patient. No nausea vomiting or diarrhea. No fevers or chills or back pain. No dysuria frequency urgency. Abdominal pain is nonradiating, dull aching, moderate intensity, worse with standing better with lying supine. Gradual in onset. No heavy lifting or sudden tearing pain  History of CK D and frequent urinary tract infections. She has a preop appointment with Dr. Erlene Quan of urology tomorrow for a planned bladder cystoscopy 6 days from now. No new acute symptoms. Tolerating oral intake without any difficulty. Ambulatory.     Past Medical History  Diagnosis Date  . Hypertension   . Shortness of breath dyspnea     doe  . Chronic kidney disease   . Arthritis   . Complication of anesthesia     tonsillectomy age 26 bad experience with mask and hard to wake up     Patient Active Problem List   Diagnosis Date Noted  . Abnormal ECG 04/21/2014  . Breathlessness on exertion 04/21/2014  . BP (high blood pressure) 04/21/2014     Past Surgical History  Procedure Laterality Date  . Tonsillectomy       Current Outpatient Rx  Name  Route  Sig  Dispense  Refill  . amLODipine (NORVASC) 10 MG tablet   Oral   Take 10 mg by mouth daily.         Marland Kitchen aspirin 81 MG tablet   Oral   Take 81 mg by mouth daily.         . cephALEXin (KEFLEX) 500 MG capsule   Oral   Take 1 capsule (500 mg total) by mouth 2 (two) times daily.   14 capsule   0   . Cholecalciferol 4000 UNITS CAPS   Oral   Take 4,000 Units by mouth daily.         Marland Kitchen docusate sodium (COLACE) 100 MG capsule   Oral  Take 2 capsules (200 mg total) by mouth 2 (two) times daily.   120 capsule   0   . ketorolac (TORADOL) 10 MG tablet   Oral   Take 1 tablet (10 mg total) by mouth every 8 (eight) hours as needed.   20 tablet   0   . quinapril (ACCUPRIL) 20 MG tablet   Oral   Take 20 mg by mouth 2 (two) times daily.            Allergies Hydralazine   Family History  Problem Relation Age of Onset  . Cancer Mother   . Prostate cancer Neg Hx   . Bladder Cancer Neg Hx   . Kidney cancer Neg Hx     Social History Social History  Substance Use Topics  . Smoking status: Never Smoker   . Smokeless tobacco: Never Used  . Alcohol Use: No    Review of Systems  Constitutional:   No fever or chills. No weight changes Eyes:   No blurry vision or double vision.  ENT:   No sore throat. Cardiovascular:   No chest pain. Respiratory:   No dyspnea or cough. Gastrointestinal:   Positive as above for left lower quadrant abdominal pain without, vomiting and diarrhea.  No BRBPR or  melena. Genitourinary:   Negative for dysuria, urinary retention, bloody urine, or difficulty urinating. Musculoskeletal:   Negative for back pain. No joint swelling or pain. Skin:   Negative for rash. Neurological:   Negative for headaches, focal weakness or numbness. Psychiatric:  No anxiety or depression.   Endocrine:  No hot/cold intolerance, changes in energy, or sleep difficulty.  10-point ROS otherwise negative.  ____________________________________________   PHYSICAL EXAM:  VITAL SIGNS: ED Triage Vitals  Enc Vitals Group     BP 12/04/15 1817 153/69 mmHg     Pulse Rate 12/04/15 1817 78     Resp 12/04/15 1817 18     Temp 12/04/15 1817 98.5 F (36.9 C)     Temp Source 12/04/15 1817 Oral     SpO2 12/04/15 1817 100 %     Weight 12/04/15 1817 145 lb (65.772 kg)     Height 12/04/15 1817 5\' 3"  (1.6 m)     Head Cir --      Peak Flow --      Pain Score 12/04/15 1817 2     Pain Loc --      Pain Edu? --       Excl. in [place]? --     Vital signs reviewed, nursing assessments reviewed.   Constitutional:   Alert and oriented. Well appearing and in no distress. Eyes:   No scleral icterus. No conjunctival pallor. PERRL. EOMI ENT   Head:   Normocephalic and atraumatic.   Nose:   No congestion/rhinnorhea. No septal hematoma   Mouth/Throat:   MMM, no pharyngeal erythema. No peritonsillar mass. No uvula shift.   Neck:   No stridor. No SubQ emphysema. No meningismus. Hematological/Lymphatic/Immunilogical:   No cervical lymphadenopathy. Cardiovascular:   RRR. Normal and symmetric distal pulses are present in all extremities. No murmurs, rubs, or gallops. Respiratory:   Normal respiratory effort without tachypnea nor retractions. Breath sounds are clear and equal bilaterally. No wheezes/rales/rhonchi. Gastrointestinal:   Soft and nontender. No distention. There is no CVA tenderness.  No rebound, rigidity, or guarding. Patient examined while standing which reveals a bulging of the left inguinal area in the region of Hesselbach's triangle. This is soft and easily reducible, nontender. No evidence of inflammation or soft tissue infection in the overlying area. Genitourinary:   deferred Musculoskeletal:   Nontender with normal range of motion in all extremities. No joint effusions.  No lower extremity tenderness.  No edema. Neurologic:   Normal speech and language.  CN 2-10 normal. Motor grossly intact. No pronator drift.  Normal gait. No gross focal neurologic deficits are appreciated.  Skin:    Skin is warm, dry and intact. No rash noted.  No petechiae, purpura, or bullae. Psychiatric:   Mood and affect are normal. Speech and behavior are normal. Patient exhibits appropriate insight and judgment.  ____________________________________________    LABS (pertinent positives/negatives) (all labs ordered are listed, but only abnormal results are displayed) Labs Reviewed  COMPREHENSIVE METABOLIC  PANEL - Abnormal; Notable for the following:    Glucose, Bld 119 (*)    BUN 26 (*)    Creatinine, Ser 1.84 (*)    ALT 12 (*)    GFR calc non Af Amer 25 (*)    GFR calc Af Amer 29 (*)    All other components within normal limits  CBC - Abnormal; Notable for the following:    RBC 3.57 (*)    Hemoglobin 10.4 (*)    HCT 31.4 (*)    RDW  15.3 (*)    All other components within normal limits  URINALYSIS COMPLETEWITH MICROSCOPIC ([facility] ONLY) - Abnormal; Notable for the following:    Color, Urine YELLOW (*)    APPearance HAZY (*)    Hgb urine dipstick 3+ (*)    Leukocytes, UA 3+ (*)    Bacteria, UA RARE (*)    Squamous Epithelial / LPF 0-5 (*)    All other components within normal limits  URINE CULTURE  LIPASE, BLOOD   ____________________________________________   EKG    ____________________________________________    RADIOLOGY    ____________________________________________   PROCEDURES   ____________________________________________   INITIAL IMPRESSION / ASSESSMENT AND PLAN / ED COURSE  Pertinent labs & imaging results that were available during my care of the patient were reviewed by me and considered in my medical decision making (see chart for details).  Patient presents with left lower quadrant pain which is worse when standing which appears to be due to an inguinal hernia. This is not incarcerated and she has no symptoms of obstruction. This is suitable for outpatient follow-up and general surgery clinic.  Labs done in the emergency department today revealed that the patient has a fairly convincing urinary tract infection. This is likely related to her chronic recurrent urinary tract infection disorder which may be due to some structural abnormality with her bladder. She has close follow-up already with urology for further evaluation of this. Although her creatinine is 1.8 which is a slight elevation from her baseline which appears to be around 1.3 or 1.4, She is  ambulatory, afebrile, eating and drinking normally. She is very well appearing and not in distress, nontoxic, no evidence of sepsis. I do not think that she warrants inpatient treatment and is suitable for oral antibiotics and follow-up.  We'll give the patient an dose of Keflex now and then write a prescription as well. Follow-up information for urology Dr. Erlene Quan and general surgery for the hernia.     ____________________________________________   FINAL CLINICAL IMPRESSION(S) / ED DIAGNOSES  Final diagnoses:  Cystitis  Unilateral inguinal hernia without obstruction or gangrene, recurrence not specified      Carrie Mew, MD 12/04/15 2224

## 2015-12-04 NOTE — ED Notes (Signed)
Pt to ed with c/o left lower quad abd pain when standing.  Pt reports area in left lower quad has had swelling x 1 month, but pain did not start until yesterday and today.  Pt denies n/v/d.

## 2015-12-04 NOTE — Discharge Instructions (Signed)
Inguinal Hernia, Adult Muscles help keep everything in the body in its proper place. But if a weak spot in the muscles develops, something can poke through. That is called a hernia. When this happens in the lower part of the belly (abdomen), it is called an inguinal hernia. (It takes its name from a part of the body in this region called the inguinal canal.) A weak spot in the wall of muscles lets some fat or part of the small intestine bulge through. An inguinal hernia can develop at any age. Men get them more often than women. CAUSES  In adults, an inguinal hernia develops over time.  It can be triggered by:  Suddenly straining the muscles of the lower abdomen.  Lifting heavy objects.  Straining to have a bowel movement. Difficult bowel movements (constipation) can lead to this.  Constant coughing. This may be caused by smoking or lung disease.  Being overweight.  Being pregnant.  Working at a job that requires long periods of standing or heavy lifting.  Having had an inguinal hernia before. One type can be an emergency situation. It is called a strangulated inguinal hernia. It develops if part of the small intestine slips through the weak spot and cannot get back into the abdomen. The blood supply can be cut off. If that happens, part of the intestine may die. This situation requires emergency surgery. SYMPTOMS  Often, a small inguinal hernia has no symptoms. It is found when a healthcare provider does a physical exam. Larger hernias usually have symptoms.   In adults, symptoms may include:  A lump in the groin. This is easier to see when the person is standing. It might disappear when lying down.  In men, a lump in the scrotum.  Pain or burning in the groin. This occurs especially when lifting, straining or coughing.  A dull ache or feeling of pressure in the groin.  Signs of a strangulated hernia can include:  A bulge in the groin that becomes very painful and tender to the  touch.  A bulge that turns red or purple.  Fever, nausea and vomiting.  Inability to have a bowel movement or to pass gas. DIAGNOSIS  To decide if you have an inguinal hernia, a healthcare provider will probably do a physical examination.  This will include asking questions about any symptoms you have noticed.  The healthcare provider might feel the groin area and ask you to cough. If an inguinal hernia is felt, the healthcare provider may try to slide it back into the abdomen.  Usually no other tests are needed. TREATMENT  Treatments can vary. The size of the hernia makes a difference. Options include:  Watchful waiting. This is often suggested if the hernia is small and you have had no symptoms.  No medical procedure will be done unless symptoms develop.  You will need to watch closely for symptoms. If any occur, contact your healthcare provider right away.  Surgery. This is used if the hernia is larger or you have symptoms.  Open surgery. This is usually an outpatient procedure (you will not stay overnight in a hospital). An cut (incision) is made through the skin in the groin. The hernia is put back inside the abdomen. The weak area in the muscles is then repaired by herniorrhaphy or hernioplasty. Herniorrhaphy: in this type of surgery, the weak muscles are sewn back together. Hernioplasty: a patch or mesh is used to close the weak area in the abdominal wall.  Laparoscopy.  In this procedure, a surgeon makes small incisions. A thin tube with a tiny video camera (called a laparoscope) is put into the abdomen. The surgeon repairs the hernia with mesh by looking with the video camera and using two long instruments. HOME CARE INSTRUCTIONS   After surgery to repair an inguinal hernia:  You will need to take pain medicine prescribed by your healthcare provider. Follow all directions carefully.  You will need to take care of the wound from the incision.  Your activity will be  restricted for awhile. This will probably include no heavy lifting for several weeks. You also should not do anything too active for a few weeks. When you can return to work will depend on the type of job that you have.  During "watchful waiting" periods, you should:  Maintain a healthy weight.  Eat a diet high in fiber (fruits, vegetables and whole grains).  Drink plenty of fluids to avoid constipation. This means drinking enough water and other liquids to keep your urine clear or pale yellow.  Do not lift heavy objects.  Do not stand for long periods of time.  Quit smoking. This should keep you from developing a frequent cough. SEEK MEDICAL CARE IF:   A bulge develops in your groin area.  You feel pain, a burning sensation or pressure in the groin. This might be worse if you are lifting or straining.  You develop a fever of more than 100.5 F (38.1 C). SEEK IMMEDIATE MEDICAL CARE IF:   Pain in the groin increases suddenly.  A bulge in the groin gets bigger suddenly and does not go down.  For men, there is sudden pain in the scrotum. Or, the size of the scrotum increases.  A bulge in the groin area becomes red or purple and is painful to touch.  You have nausea or vomiting that does not go away.  You feel your heart beating much faster than normal.  You cannot have a bowel movement or pass gas.  You develop a fever of more than 102.0 F (38.9 C).   This information is not intended to replace advice given to you by your health care provider. Make sure you discuss any questions you have with your health care provider.   Document Released: 03/01/2009 Document Revised: 01/05/2012 Document Reviewed: 04/16/2015 Elsevier Interactive Patient Education 2016 Else[employer]  Urinary Tract Infection Urinary tract infections (UTIs) can develop anywhere along your urinary tract. Your urinary tract is your body's drainage system for removing wastes and extra water. Your urinary  tract includes two kidneys, two ureters, a bladder, and a urethra. Your kidneys are a pair of bean-shaped organs. Each kidney is about the size of your fist. They are located below your ribs, one on each side of your spine. CAUSES Infections are caused by microbes, which are microscopic organisms, including fungi, viruses, and bacteria. These organisms are so small that they can only be seen through a microscope. Bacteria are the microbes that most commonly cause UTIs. SYMPTOMS  Symptoms of UTIs may vary by age and gender of the patient and by the location of the infection. Symptoms in young women typically include a frequent and intense urge to urinate and a painful, burning feeling in the bladder or urethra during urination. Older women and men are more likely to be tired, shaky, and weak and have muscle aches and abdominal pain. A fever may mean the infection is in your kidneys. Other symptoms of a kidney infection include pain  in your back or sides below the ribs, nausea, and vomiting. DIAGNOSIS To diagnose a UTI, your caregiver will ask you about your symptoms. Your caregiver will also ask you to provide a urine sample. The urine sample will be tested for bacteria and white blood cells. White blood cells are made by your body to help fight infection. TREATMENT  Typically, UTIs can be treated with medication. Because most UTIs are caused by a bacterial infection, they usually can be treated with the use of antibiotics. The choice of antibiotic and length of treatment depend on your symptoms and the type of bacteria causing your infection. HOME CARE INSTRUCTIONS  If you were prescribed antibiotics, take them exactly as your caregiver instructs you. Finish the medication even if you feel better after you have only taken some of the medication.  Drink enough water and fluids to keep your urine clear or pale yellow.  Avoid caffeine, tea, and carbonated beverages. They tend to irritate your  bladder.  Empty your bladder often. Avoid holding urine for long periods of time.  Empty your bladder before and after sexual intercourse.  After a bowel movement, women should cleanse from front to back. Use each tissue only once. SEEK MEDICAL CARE IF:   You have back pain.  You develop a fever.  Your symptoms do not begin to resolve within 3 days. SEEK IMMEDIATE MEDICAL CARE IF:   You have severe back pain or lower abdominal pain.  You develop chills.  You have nausea or vomiting.  You have continued burning or discomfort with urination. MAKE SURE YOU:   Understand these instructions.  Will watch your condition.  Will get help right away if you are not doing well or get worse.   This information is not intended to replace advice given to you by your health care provider. Make sure you discuss any questions you have with your health care provider.   Document Released: 07/23/2005 Document Revised: 07/04/2015 Document Reviewed: 11/21/2011 Elsevier Interactive Patient Education [employer].

## 2015-12-04 NOTE — ED Notes (Signed)
Patient is resting comfortably. 

## 2015-12-05 ENCOUNTER — Encounter
Admission: RE | Admit: 2015-12-05 | Discharge: 2015-12-05 | Disposition: A | Discharge status: Home / Self Care | Payer: Medicare Other | Source: Ambulatory Visit | Attending: Urology | Admitting: Urology

## 2015-12-05 DIAGNOSIS — Z01818 Encounter for other preprocedural examination: Secondary | ICD-10-CM | POA: Diagnosis present

## 2015-12-05 HISTORY — DX: Hypertrophy of kidney: N28.81

## 2015-12-05 HISTORY — DX: Ventral hernia without obstruction or gangrene: K43.9

## 2015-12-05 NOTE — Patient Instructions (Signed)
  Your procedure is scheduled on: Monday Feb. 13, 2017. Report to Same Day Surgery. To find out your arrival time please call [phone] between 1PM - 3PM on Friday Feb. 10, 2017.  Remember: Instructions that are not followed completely may result in serious medical risk, up to and including death, or upon the discretion of your surgeon and anesthesiologist your surgery may need to be rescheduled.    _x___ 1. Do not eat food or drink liquids after midnight. No gum chewing or hard candies.     ____ 2. No Alcohol for 24 hours before or after surgery.   ____ 3. Bring all medications with you on the day of surgery if instructed.    __x__ 4. Notify your doctor if there is any change in your medical condition     (cold, fever, infections).     Do not wear jewelry, make-up, hairpins, clips or nail polish.  Do not wear lotions, powders, or perfumes. You may wear deodorant.  Do not shave 48 hours prior to surgery. Men may shave face and neck.  Do not bring valuables to the hospital.    [facility] is not responsible for any belongings or valuables.               Contacts, dentures or bridgework may not be worn into surgery.  Leave your suitcase in the car. After surgery it may be brought to your room.  For patients admitted to the hospital, discharge time is determined by your treatment team.   Patients discharged the day of surgery will not be allowed to drive home.    Please read over the following fact sheets that you were given:   [facility] Preparing for Surgery  _x___ Take these medicines the morning of surgery with A SIP OF WATER:    1. amLODipine (NORVASC)  2. quinapril (ACCUPRIL)    ____ Fleet Enema (as directed)   ____ Use CHG Soap as directed  ____ Use inhalers on the day of surgery  ____ Stop metformin 2 days prior to surgery    ____ Take 1/2 of usual insulin dose the night before surgery and none on the morning of  surgery.   _x___ Stopped aspirin on  12/02/15.  _x___ Stop Anti-inflammatories Aleve, ibuprofen, motrin, advil.  OK to take tylenol for pain.   _x___ Stop supplements Vitamin D until after surgery.    ____ Bring C-Pap to the hospital.

## 2015-12-06 LAB — URINE CULTURE

## 2015-12-10 ENCOUNTER — Inpatient Hospital Stay
Admission: RE | Admit: 2015-12-10 | Discharge: 2015-12-15 | DRG: 656 | Disposition: A | Discharge status: Home Health | Payer: Medicare Other | Source: Ambulatory Visit | Attending: Urology | Admitting: Urology

## 2015-12-10 ENCOUNTER — Ambulatory Visit: Payer: Medicare Other | Admitting: Registered Nurse

## 2015-12-10 ENCOUNTER — Encounter
Admission: RE | Disposition: A | Discharge status: Home Health | Payer: Self-pay | Source: Ambulatory Visit | Attending: Urology

## 2015-12-10 ENCOUNTER — Observation Stay: Payer: Medicare Other

## 2015-12-10 ENCOUNTER — Encounter: Payer: Self-pay | Admitting: *Deleted

## 2015-12-10 DIAGNOSIS — R310 Gross hematuria: Secondary | ICD-10-CM | POA: Diagnosis present

## 2015-12-10 DIAGNOSIS — N131 Hydronephrosis with ureteral stricture, not elsewhere classified: Secondary | ICD-10-CM | POA: Diagnosis present

## 2015-12-10 DIAGNOSIS — N183 Chronic kidney disease, stage 3 (moderate): Secondary | ICD-10-CM | POA: Diagnosis present

## 2015-12-10 DIAGNOSIS — M1990 Unspecified osteoarthritis, unspecified site: Secondary | ICD-10-CM | POA: Diagnosis present

## 2015-12-10 DIAGNOSIS — N179 Acute kidney failure, unspecified: Secondary | ICD-10-CM | POA: Diagnosis present

## 2015-12-10 DIAGNOSIS — N170 Acute kidney failure with tubular necrosis: Secondary | ICD-10-CM | POA: Diagnosis present

## 2015-12-10 DIAGNOSIS — Z7982 Long term (current) use of aspirin: Secondary | ICD-10-CM

## 2015-12-10 DIAGNOSIS — E222 Syndrome of inappropriate secretion of antidiuretic hormone: Secondary | ICD-10-CM | POA: Diagnosis present

## 2015-12-10 DIAGNOSIS — N1330 Unspecified hydronephrosis: Secondary | ICD-10-CM

## 2015-12-10 DIAGNOSIS — D638 Anemia in other chronic diseases classified elsewhere: Secondary | ICD-10-CM | POA: Diagnosis present

## 2015-12-10 DIAGNOSIS — E871 Hypo-osmolality and hyponatremia: Secondary | ICD-10-CM | POA: Diagnosis present

## 2015-12-10 DIAGNOSIS — I129 Hypertensive chronic kidney disease with stage 1 through stage 4 chronic kidney disease, or unspecified chronic kidney disease: Secondary | ICD-10-CM | POA: Diagnosis present

## 2015-12-10 DIAGNOSIS — N19 Unspecified kidney failure: Secondary | ICD-10-CM

## 2015-12-10 DIAGNOSIS — E872 Acidosis: Secondary | ICD-10-CM | POA: Diagnosis present

## 2015-12-10 DIAGNOSIS — D259 Leiomyoma of uterus, unspecified: Secondary | ICD-10-CM | POA: Diagnosis present

## 2015-12-10 DIAGNOSIS — D494 Neoplasm of unspecified behavior of bladder: Principal | ICD-10-CM | POA: Diagnosis present

## 2015-12-10 DIAGNOSIS — Z936 Other artificial openings of urinary tract status: Secondary | ICD-10-CM

## 2015-12-10 DIAGNOSIS — Z87891 Personal history of nicotine dependence: Secondary | ICD-10-CM

## 2015-12-10 DIAGNOSIS — C670 Malignant neoplasm of trigone of bladder: Secondary | ICD-10-CM | POA: Diagnosis not present

## 2015-12-10 DIAGNOSIS — N3289 Other specified disorders of bladder: Secondary | ICD-10-CM | POA: Diagnosis present

## 2015-12-10 DIAGNOSIS — Z809 Family history of malignant neoplasm, unspecified: Secondary | ICD-10-CM

## 2015-12-10 DIAGNOSIS — K439 Ventral hernia without obstruction or gangrene: Secondary | ICD-10-CM | POA: Diagnosis present

## 2015-12-10 HISTORY — PX: TRANSURETHRAL RESECTION OF BLADDER TUMOR: SHX2575

## 2015-12-10 HISTORY — DX: Acute kidney failure, unspecified: N17.9

## 2015-12-10 HISTORY — PX: CYSTOSCOPY W/ RETROGRADES: SHX1426

## 2015-12-10 LAB — CBC
HEMATOCRIT: 32.9 % — AB (ref 35.0–47.0)
Hemoglobin: 11.1 g/dL — ABNORMAL LOW (ref 12.0–16.0)
MCH: 29.3 pg (ref 26.0–34.0)
MCHC: 33.7 g/dL (ref 32.0–36.0)
MCV: 86.9 fL (ref 80.0–100.0)
PLATELETS: 231 10*3/uL (ref 150–440)
RBC: 3.79 MIL/uL — ABNORMAL LOW (ref 3.80–5.20)
RDW: 14.7 % — AB (ref 11.5–14.5)
WBC: 6.1 10*3/uL (ref 3.6–11.0)

## 2015-12-10 LAB — BASIC METABOLIC PANEL
Anion gap: 17 — ABNORMAL HIGH (ref 5–15)
Anion gap: 18 — ABNORMAL HIGH (ref 5–15)
BUN: 62 mg/dL — AB (ref 6–20)
BUN: 65 mg/dL — AB (ref 6–20)
CALCIUM: 8.7 mg/dL — AB (ref 8.9–10.3)
CHLORIDE: 92 mmol/L — AB (ref 101–111)
CO2: 15 mmol/L — AB (ref 22–32)
CO2: 15 mmol/L — ABNORMAL LOW (ref 22–32)
CREATININE: 9.29 mg/dL — AB (ref 0.44–1.00)
Calcium: 8.7 mg/dL — ABNORMAL LOW (ref 8.9–10.3)
Chloride: 91 mmol/L — ABNORMAL LOW (ref 101–111)
Creatinine, Ser: 8.97 mg/dL — ABNORMAL HIGH (ref 0.44–1.00)
GFR calc Af Amer: 4 mL/min — ABNORMAL LOW (ref >60)
GFR calc non Af Amer: 4 mL/min — ABNORMAL LOW (ref >60)
GFR, EST AFRICAN AMERICAN: 4 mL/min — AB (ref >60)
GFR, EST NON AFRICAN AMERICAN: 4 mL/min — AB (ref >60)
GLUCOSE: 71 mg/dL (ref 65–99)
GLUCOSE: 101 mg/dL — AB (ref 65–99)
Potassium: 4.3 mmol/L (ref 3.5–5.1)
Potassium: 4.9 mmol/L (ref 3.5–5.1)
SODIUM: 124 mmol/L — AB (ref 135–145)
Sodium: 124 mmol/L — ABNORMAL LOW (ref 135–145)

## 2015-12-10 SURGERY — CYSTOSCOPY, WITH RETROGRADE PYELOGRAM
Anesthesia: General | Site: Bladder | Laterality: Right | Wound class: Clean Contaminated

## 2015-12-10 MED ORDER — CEFAZOLIN SODIUM-DEXTROSE 2-3 GM-% IV SOLR
INTRAVENOUS | Status: AC
  Filled 2015-12-10: qty 50

## 2015-12-10 MED ORDER — CEFAZOLIN SODIUM-DEXTROSE 2-3 GM-% IV SOLR
2.0000 g | INTRAVENOUS | Status: AC
  Administered 2015-12-10: 2 g via INTRAVENOUS

## 2015-12-10 MED ORDER — EPHEDRINE SULFATE 50 MG/ML IJ SOLN
INTRAMUSCULAR | Status: DC | PRN
  Administered 2015-12-10 (×2): 10 mg via INTRAVENOUS

## 2015-12-10 MED ORDER — DIPHENHYDRAMINE HCL 12.5 MG/5ML PO ELIX: 12.5000 mg | ORAL_SOLUTION | Freq: Four times a day (QID) | ORAL | Status: DC | PRN

## 2015-12-10 MED ORDER — PHENYLEPHRINE HCL 10 MG/ML IJ SOLN
INTRAMUSCULAR | Status: DC | PRN
  Administered 2015-12-10 (×4): 100 ug via INTRAVENOUS

## 2015-12-10 MED ORDER — VITAMIN D 1000 UNITS PO TABS
4000.0000 [IU] | ORAL_TABLET | Freq: Every day | ORAL | Status: DC
  Administered 2015-12-11 – 2015-12-15 (×5): 4000 [IU] via ORAL
  Filled 2015-12-10 (×2): qty 4
  Filled 2015-12-10: qty 3
  Filled 2015-12-10 (×6): qty 4

## 2015-12-10 MED ORDER — ONDANSETRON HCL 4 MG/2ML IJ SOLN: 4.0000 mg | Freq: Once | INTRAMUSCULAR | Status: DC | PRN

## 2015-12-10 MED ORDER — FAMOTIDINE 20 MG PO TABS
ORAL_TABLET | ORAL | Status: AC
  Filled 2015-12-10: qty 1

## 2015-12-10 MED ORDER — CEFAZOLIN SODIUM 1-5 GM-% IV SOLN
1.0000 g | Freq: Once | INTRAVENOUS | Status: AC
  Administered 2015-12-10: 1 g via INTRAVENOUS
  Filled 2015-12-10: qty 50

## 2015-12-10 MED ORDER — FENTANYL CITRATE (PF) 100 MCG/2ML IJ SOLN
INTRAMUSCULAR | Status: AC
  Filled 2015-12-10: qty 4

## 2015-12-10 MED ORDER — GLYCOPYRROLATE 0.2 MG/ML IJ SOLN
INTRAMUSCULAR | Status: DC | PRN
  Administered 2015-12-10: 0.2 mg via INTRAVENOUS

## 2015-12-10 MED ORDER — SODIUM BICARBONATE 8.4 % IV SOLN
INTRAVENOUS | Status: AC
  Administered 2015-12-10: 22:00:00 via INTRAVENOUS
  Filled 2015-12-10: qty 100

## 2015-12-10 MED ORDER — MIDAZOLAM HCL 5 MG/5ML IJ SOLN
INTRAMUSCULAR | Status: AC | PRN
  Administered 2015-12-10 (×2): 1 mg via INTRAVENOUS

## 2015-12-10 MED ORDER — ACETAMINOPHEN 325 MG PO TABS: 650.0000 mg | ORAL_TABLET | ORAL | Status: DC | PRN

## 2015-12-10 MED ORDER — ACETAMINOPHEN 10 MG/ML IV SOLN
INTRAVENOUS | Status: AC
  Filled 2015-12-10: qty 100

## 2015-12-10 MED ORDER — OXYCODONE HCL 5 MG PO TABS: 5.0000 mg | ORAL_TABLET | ORAL | Status: DC | PRN

## 2015-12-10 MED ORDER — FENTANYL CITRATE (PF) 100 MCG/2ML IJ SOLN: 25.0000 ug | INTRAMUSCULAR | Status: DC | PRN

## 2015-12-10 MED ORDER — MIDAZOLAM HCL 2 MG/2ML IJ SOLN
INTRAMUSCULAR | Status: DC | PRN
  Administered 2015-12-10: 2 mg via INTRAVENOUS

## 2015-12-10 MED ORDER — CEFAZOLIN SODIUM 1-5 GM-% IV SOLN
1.0000 g | Freq: Three times a day (TID) | INTRAVENOUS | Status: DC
  Filled 2015-12-10 (×2): qty 50

## 2015-12-10 MED ORDER — ONDANSETRON HCL 4 MG/2ML IJ SOLN
INTRAMUSCULAR | Status: DC | PRN
  Administered 2015-12-10: 4 mg via INTRAVENOUS

## 2015-12-10 MED ORDER — AMLODIPINE BESYLATE 10 MG PO TABS
10.0000 mg | ORAL_TABLET | Freq: Every day | ORAL | Status: DC
  Administered 2015-12-11 – 2015-12-13 (×3): 10 mg via ORAL
  Filled 2015-12-10 (×3): qty 1

## 2015-12-10 MED ORDER — SODIUM CHLORIDE 0.9 % IV SOLN
INTRAVENOUS | Status: DC
  Administered 2015-12-10: 16:00:00 via INTRAVENOUS

## 2015-12-10 MED ORDER — BELLADONNA ALKALOIDS-OPIUM 16.2-60 MG RE SUPP: 1.0000 | Freq: Four times a day (QID) | RECTAL | Status: DC | PRN

## 2015-12-10 MED ORDER — PROPOFOL 10 MG/ML IV BOLUS
INTRAVENOUS | Status: DC | PRN
  Administered 2015-12-10 (×2): 50 mg via INTRAVENOUS
  Administered 2015-12-10: 100 mg via INTRAVENOUS

## 2015-12-10 MED ORDER — LIDOCAINE HCL (CARDIAC) 20 MG/ML IV SOLN
INTRAVENOUS | Status: DC | PRN
  Administered 2015-12-10: 80 mg via INTRAVENOUS

## 2015-12-10 MED ORDER — SODIUM CHLORIDE 0.9 % IV SOLN
INTRAVENOUS | Status: DC
  Administered 2015-12-10: 11:00:00 via INTRAVENOUS

## 2015-12-10 MED ORDER — MIDAZOLAM HCL 5 MG/5ML IJ SOLN
INTRAMUSCULAR | Status: AC
  Filled 2015-12-10: qty 10

## 2015-12-10 MED ORDER — DEXAMETHASONE SODIUM PHOSPHATE 10 MG/ML IJ SOLN
INTRAMUSCULAR | Status: DC | PRN
  Administered 2015-12-10: 5 mg via INTRAVENOUS

## 2015-12-10 MED ORDER — LIDOCAINE HCL (PF) 1 % IJ SOLN
INTRAMUSCULAR | Status: AC
  Filled 2015-12-10: qty 30

## 2015-12-10 MED ORDER — QUINAPRIL HCL 10 MG PO TABS
20.0000 mg | ORAL_TABLET | Freq: Two times a day (BID) | ORAL | Status: DC
  Filled 2015-12-10: qty 2

## 2015-12-10 MED ORDER — ASPIRIN EC 81 MG PO TBEC
81.0000 mg | DELAYED_RELEASE_TABLET | ORAL | Status: DC
  Administered 2015-12-11 – 2015-12-12 (×2): 81 mg via ORAL
  Filled 2015-12-10 (×3): qty 1

## 2015-12-10 MED ORDER — ONDANSETRON HCL 4 MG/2ML IJ SOLN
4.0000 mg | INTRAMUSCULAR | Status: DC | PRN
  Administered 2015-12-11: 4 mg via INTRAVENOUS
  Filled 2015-12-10: qty 2

## 2015-12-10 MED ORDER — IOHEXOL 300 MG/ML  SOLN
30.0000 mL | Freq: Once | INTRAMUSCULAR | Status: AC | PRN
  Administered 2015-12-10: 10 mL

## 2015-12-10 MED ORDER — ACETAMINOPHEN 10 MG/ML IV SOLN
INTRAVENOUS | Status: DC | PRN
  Administered 2015-12-10: 1000 mg via INTRAVENOUS

## 2015-12-10 MED ORDER — VITAMIN D3 1.25 MG (50000 UT) PO CAPS
50000.0000 [IU] | ORAL_CAPSULE | ORAL | Status: DC
  Filled 2015-12-10: qty 1

## 2015-12-10 MED ORDER — DOCUSATE SODIUM 100 MG PO CAPS
200.0000 mg | ORAL_CAPSULE | Freq: Two times a day (BID) | ORAL | Status: DC
  Administered 2015-12-10 – 2015-12-15 (×9): 200 mg via ORAL
  Filled 2015-12-10 (×10): qty 2

## 2015-12-10 MED ORDER — LACTATED RINGERS IV SOLN
INTRAVENOUS | Status: DC
  Administered 2015-12-10 (×2): via INTRAVENOUS

## 2015-12-10 MED ORDER — DIPHENHYDRAMINE HCL 50 MG/ML IJ SOLN: 12.5000 mg | Freq: Four times a day (QID) | INTRAMUSCULAR | Status: DC | PRN

## 2015-12-10 MED ORDER — SODIUM CHLORIDE 0.9 % IR SOLN: Status: DC | PRN

## 2015-12-10 MED ORDER — OXYBUTYNIN CHLORIDE 5 MG PO TABS: 5.0000 mg | ORAL_TABLET | Freq: Three times a day (TID) | ORAL | Status: DC | PRN

## 2015-12-10 MED ORDER — FENTANYL CITRATE (PF) 100 MCG/2ML IJ SOLN
INTRAMUSCULAR | Status: DC | PRN
  Administered 2015-12-10: 50 ug via INTRAVENOUS
  Administered 2015-12-10 (×2): 25 ug via INTRAVENOUS

## 2015-12-10 MED ORDER — LIDOCAINE HCL (PF) 1 % IJ SOLN
INTRAMUSCULAR | Status: AC | PRN
  Administered 2015-12-10: 6 mL

## 2015-12-10 MED ORDER — FAMOTIDINE 20 MG PO TABS
20.0000 mg | ORAL_TABLET | Freq: Once | ORAL | Status: AC
  Administered 2015-12-10: 20 mg via ORAL

## 2015-12-10 MED ORDER — DOCUSATE SODIUM 100 MG PO CAPS: 100.0000 mg | ORAL_CAPSULE | Freq: Two times a day (BID) | ORAL | Status: DC

## 2015-12-10 MED ORDER — MORPHINE SULFATE (PF) 2 MG/ML IV SOLN: 2.0000 mg | INTRAVENOUS | Status: DC | PRN

## 2015-12-10 MED ORDER — IOTHALAMATE MEGLUMINE 43 % IV SOLN
INTRAVENOUS | Status: DC | PRN
  Administered 2015-12-10: 40 mL

## 2015-12-10 MED ORDER — FENTANYL CITRATE (PF) 100 MCG/2ML IJ SOLN
INTRAMUSCULAR | Status: AC | PRN
  Administered 2015-12-10: 50 ug via INTRAVENOUS

## 2015-12-10 SURGICAL SUPPLY — 32 items
ADAPTER SCOPE UROLOK II (MISCELLANEOUS) ×5 IMPLANT
BAG DRAIN CYSTO-URO LG1000N (MISCELLANEOUS) ×5 IMPLANT
BASKET ZERO TIP 1.9FR (BASKET) ×5 IMPLANT
CATH FOL 2WAY LX 20X30 (CATHETERS) ×5 IMPLANT
CATH URETL 5X70 OPEN END (CATHETERS) ×5 IMPLANT
CNTNR SPEC 2.5X3XGRAD LEK (MISCELLANEOUS) ×3
CONRAY 43 FOR UROLOGY 50M (MISCELLANEOUS) ×5 IMPLANT
CONT SPEC 4OZ STER OR WHT (MISCELLANEOUS) ×2
CONTAINER SPEC 2.5X3XGRAD LEK (MISCELLANEOUS) ×3 IMPLANT
GLIDEWIRE STIFF .35X180X3 HYDR (WIRE) ×5 IMPLANT
GLOVE BIO SURGEON STRL SZ 6.5 (GLOVE) ×4 IMPLANT
GLOVE BIO SURGEON STRL SZ7 (GLOVE) ×10 IMPLANT
GLOVE BIO SURGEONS STRL SZ 6.5 (GLOVE) ×1
GOWN STRL REUS W/ TWL LRG LVL3 (GOWN DISPOSABLE) ×6 IMPLANT
GOWN STRL REUS W/TWL LRG LVL3 (GOWN DISPOSABLE) ×4
INTRODUCER DILATOR DOUBLE (INTRODUCER) ×5 IMPLANT
IV SOD CHL 0.9% 1000ML (IV SOLUTION) ×40 IMPLANT
InLay Optima Stent ×5 IMPLANT
KIT RM TURNOVER CYSTO AR (KITS) ×5 IMPLANT
LOOP CUT BIPOLAR 24F LRG (ELECTROSURGICAL) ×5 IMPLANT
PACK CYSTO AR (MISCELLANEOUS) ×5 IMPLANT
PREP PVP WINGED SPONGE (MISCELLANEOUS) ×5 IMPLANT
PUMP SINGLE ACTION SAP (PUMP) ×5 IMPLANT
SENSORWIRE 0.038 NOT ANGLED (WIRE) ×10
SET CYSTO W/LG BORE CLAMP LF (SET/KITS/TRAYS/PACK) ×5 IMPLANT
SHEATH URETERAL 12FRX35CM (MISCELLANEOUS) ×5 IMPLANT
STENT URET 6FRX24 CONTOUR (STENTS) ×5 IMPLANT
STENT URET 6FRX26 CONTOUR (STENTS) IMPLANT
SURGILUBE 2OZ TUBE FLIPTOP (MISCELLANEOUS) ×5 IMPLANT
SYRINGE IRR TOOMEY STRL 70CC (SYRINGE) ×5 IMPLANT
WATER STERILE IRR 1000ML POUR (IV SOLUTION) ×5 IMPLANT
WIRE SENSOR 0.038 NOT ANGLED (WIRE) ×6 IMPLANT

## 2015-12-10 NOTE — Interval H&P Note (Signed)
History and Physical Interval Note:  12/10/2015 7:26 AM  [place]  has presented today for surgery, with the diagnosis of gross hematuria,right hydronephrosis  The various methods of treatment have been discussed with the patient and family. After consideration of risks, benefits and other options for treatment, the patient has consented to  Procedure(s): CYSTOSCOPY/URETEROSCOPY/HOLMIUM LASER/STENT PLACEMENT (Right) CYSTOSCOPY WITH RETROGRADE PYELOGRAM (Bilateral) as a surgical intervention .  The patient's history has been reviewed, patient examined, no change in status, stable for surgery.  I have reviewed the patient's chart and labs.  Questions were answered to the patient's satisfaction.    Possible Holmium laser   Hollice Espy

## 2015-12-10 NOTE — Anesthesia Postprocedure Evaluation (Signed)
Anesthesia Post Note  Patient: Tonya Moreno  Procedure(s) Performed: Procedure(s) (LRB): CYSTOSCOPY WITH RETROGRADE PYELOGRAM (Left) TRANSURETHRAL RESECTION OF BLADDER TUMOR (TURBT) (N/A) EXAM UNDER ANESTHESIA (N/A)  Patient location during evaluation: PACU Anesthesia Type: General Level of consciousness: awake Pain management: pain level controlled Vital Signs Assessment: post-procedure vital signs reviewed and stable Respiratory status: spontaneous breathing Cardiovascular status: blood pressure returned to baseline Postop Assessment: no headache Anesthetic complications: no    Last Vitals:  Filed Vitals:   12/10/15 0938 12/10/15 0954  BP: 109/59 105/51  Pulse: 69 55  Temp:    Resp: 14 10    Last Pain:  Filed Vitals:   12/10/15 0955  PainSc: 2                  [name]

## 2015-12-10 NOTE — Transfer of Care (Signed)
Immediate Anesthesia Transfer of Care Note  Patient: [place]  Procedure(s) Performed: Procedure(s): CYSTOSCOPY WITH RETROGRADE PYELOGRAM (Left) TRANSURETHRAL RESECTION OF BLADDER TUMOR (TURBT) (N/A) EXAM UNDER ANESTHESIA (N/A)  Patient Location: PACU  Anesthesia Type:General  Level of Consciousness: sedated  Airway & Oxygen Therapy: Patient Spontanous Breathing and Patient connected to face mask oxygen  Post-op Assessment: Report given to RN and Post -op Vital signs reviewed and stable  Post vital signs: Reviewed and stable  Last Vitals:  Filed Vitals:   12/10/15 0614 12/10/15 0924  BP: 147/79 109/59  Pulse: 83 72  Temp: 36.4 C 36.2 C  Resp: 12 12    Complications: No apparent anesthesia complications

## 2015-12-10 NOTE — H&P (View-Only) (Signed)
.   1:22 PM  11/27/2015   Donnie Aho [place] 1934/01/05 [id number]  Referring provider: Letta Median, MD [place], [place] [address]  Chief Complaint  Patient presents with  . Nephrolithiasis    discuss surgery    HPI: 82 yo F with scheduled for cystoscopy, bilateral retrograde pyelogram, right ureteroscopy with possible intervention/ureteral stent placement who canceled surgery due to various concerns. She returns to the office today with her family to discuss the procedure again and to address all of her concerns. She  is primarily concerned today about anesthesia.  She was confused and thought that her surgery was going to happen today.  She initially presented on 10/25/15 for evaluation of recurrent episodes of asymptomatic gross hematuria 2. She was seen in the emergency room for one such episode and also a month prior to that as well. She was treated for presumed urinary tract infections although UAs were not particularly suspicious for this and urine cultures have been negative. He denied any associated urinary symptoms including urinary urgency, frequency, dysuria, or fevers.  As part of her workup, she did have a CT stone protocol which showed somewhat chronic-appearing right hydroureteronephrosis down to the distal ureter. She has no previous knowledge of having hydronephrosis or workup for this. Previous abdominal surgeries or pelvic radiation. She denies any flank pain.  Creatinine slightly elevated to 1.3 from previous 1.0 in the emergency room yesterday.  Formal social smoker, has not smoked in many years.     PMH: Past Medical History  Diagnosis Date  . Hypertension   . Shortness of breath dyspnea     doe  . Chronic kidney disease   . Arthritis   . Complication of anesthesia     tonsillectomy age 28 bad experience with mask and hard to wake up    Surgical History: Past Surgical History  Procedure Laterality Date  .  Tonsillectomy      Home Medications:    Medication List       This list is accurate as of: 11/27/15  1:22 PM.  Always use your most recent med list.               amLODipine 10 MG tablet  Commonly known as:  NORVASC  Take 10 mg by mouth daily.     aspirin 81 MG tablet  Take 81 mg by mouth daily.     Cholecalciferol 4000 units Caps  Take 4,000 Units by mouth daily.     ketorolac 10 MG tablet  Commonly known as:  TORADOL  Take 1 tablet (10 mg total) by mouth every 8 (eight) hours as needed.     quinapril 20 MG tablet  Commonly known as:  ACCUPRIL  Take 20 mg by mouth 2 (two) times daily.        Allergies:  Allergies  Allergen Reactions  . Hydralazine Palpitations  . Quinapril     Other reaction(s): Unknown    Family History: Family History  Problem Relation Age of Onset  . Cancer Mother   . Prostate cancer Neg Hx   . Bladder Cancer Neg Hx   . Kidney cancer Neg Hx     Social History:  reports that she has never smoked. She has never used smokeless tobacco. She reports that she does not drink alcohol or use illicit drugs.  ROS: UROLOGY Frequent Urination?: No Hard to postpone urination?: No Burning/pain with urination?: No Get up at night to urinate?: No Leakage of  urine?: Yes Urine stream starts and stops?: No Trouble starting stream?: No Do you have to strain to urinate?: No Blood in urine?: No Urinary tract infection?: No Sexually transmitted disease?: No Injury to kidneys or bladder?: No Painful intercourse?: No Weak stream?: No Currently pregnant?: No Vaginal bleeding?: No Last menstrual period?: n  Gastrointestinal Nausea?: No Vomiting?: No Indigestion/heartburn?: No Diarrhea?: No Constipation?: No  Constitutional Fever: No Night sweats?: No Weight loss?: No Fatigue?: No  Skin Skin rash/lesions?: No Itching?: No  Eyes Blurred vision?: No Double vision?: No  Ears/Nose/Throat Sore throat?: No Sinus problems?:  No  Hematologic/Lymphatic Swollen glands?: No Easy bruising?: No  Cardiovascular Leg swelling?: No Chest pain?: No  Respiratory Cough?: No Shortness of breath?: Yes  Endocrine Excessive thirst?: No  Musculoskeletal Back pain?: No Joint pain?: No  Neurological Headaches?: No Dizziness?: No  Psychologic Depression?: No Anxiety?: No  Physical Exam: BP 175/89 mmHg  Pulse 84  Ht 5\' 5"  (1.651 m)  Wt 145 lb 14.4 oz (66.18 kg)  BMI 24.28 kg/m2  Constitutional:  Alert and oriented, No acute distress.  Multiple family members present today with Ms. Shofner including her children. HEENT: [place] AT, moist mucus membranes.  Trachea midline, no masses. Cardiovascular: No clubbing, cyanosis, or edema.  RRR. Respiratory: Normal respiratory effort, no increased work of breathing. CTAB. GI: Abdomen is soft, nontender, nondistended, no abdominal masses GU: No CVA tenderness.  Skin: No rashes, bruises or suspicious lesions. Neurologic: Grossly intact, no focal deficits, moving all 4 extremities. Psychiatric: Normal mood and affect.  Laboratory Data: Lab Results  Component Value Date   WBC 7.1 10/24/2015   HGB 11.5* 10/24/2015   HCT 34.6* 10/24/2015   MCV 86.4 10/24/2015   PLT 208 10/24/2015    Lab Results  Component Value Date   CREATININE 1.31* 10/24/2015    Urinalysis UA today with greater than 30 red blood cells, 0-5 white blood cells, many bacteria, nitrite negative.    Pertinent Imaging:  CLINICAL DATA: Acute onset of hematuria. Initial encounter.  EXAM: CT ABDOMEN AND PELVIS WITHOUT CONTRAST  TECHNIQUE: Multidetector CT imaging of the abdomen and pelvis was performed following the standard protocol without IV contrast.  COMPARISON: None.  FINDINGS: Minimal right basilar atelectasis is noted.  The liver and spleen are unremarkable in appearance. The gallbladder is within normal limits. The pancreas and adrenal glands are unremarkable.  There is  chronic relatively severe right-sided hydronephrosis, with diffuse distention of the right ureter along most of its course. Distention resolves several centimeters above the right vesicoureteral junction, possibly reflecting a focal stricture. Underlying mass cannot be entirely excluded.  The left kidney is unremarkable in appearance. Mild nonspecific right-sided perinephric stranding is seen. No renal or ureteral stones are identified.  No free fluid is identified. The small bowel is unremarkable in appearance. The stomach is within normal limits. No acute vascular abnormalities are seen.  The appendix is not definitely characterized; there is no evidence for appendicitis. Minimal diverticulosis is noted along the descending colon. The colon is unremarkable in appearance.  The bladder is moderately distended and grossly unremarkable. Scattered uterine fibroids are seen. A 3.6 cm hypodense focus at the uterine fundus may reflect a degenerating fibroid. The ovaries are relatively symmetric. No suspicious adnexal masses are seen. No inguinal lymphadenopathy is seen.  No acute osseous abnormalities are identified. Joint space narrowing is noted at the right hip, with subcortical cystic change. Multilevel endplate irregularity is noted along the lower thoracic and lumbar spine, with underlying  facet disease.  IMPRESSION: 1. Chronic relatively severe right-sided hydronephrosis, with diffuse distention of the right ureter along most of its course. Distention resolves several centimeters above the right vesicoureteral junction. This may reflect a focal stricture. Underlying mass cannot be excluded, especially given hematuria. Ureteroscopy could be considered for further evaluation, as deemed clinically appropriate. 2. Minimal diverticulosis along the descending colon, without evidence of diverticulitis. 3. Scattered uterine fibroids seen. 3.6 cm hypodense focus at the uterine  fundus may reflect a degenerating fibroid, though pelvic ultrasound could be considered for further evaluation to exclude a more worrying mass, as deemed clinically appropriate. 4. Degenerative change at the right hip.   Electronically Signed  By: Garald Balding M.D.  On: 10/24/2015 06:31     Assessment & Plan:  82 year old female with recurrent asymptomatic gross hematuria, severe right chronic-appearing hydroureteronephrosis of unclear etiology. I do have concern for possible intraluminal obstructing ureteral tumor. I recommended further workup with cystoscopy, right ureteroscopy, bilateral retrograde, with possible intervention/ stent in the operating room. We discussed risk and benefits of this.  Risk of anesthesia was also discussed today. All of her questions were answered again today but with family present.  She seems to fully understand the reason for surgery today and has agreed to reschedule. She understands the importance of close follow-up of this.  1. Gross hematuria As above, further workup in the OR. Advised to stop aspirin 81 mg prior to surgery. - Urinalysis, Complete -repeat UCx preop  2. Hydronephrosis, right As above.  3. Renal insufficiency Creatinine slightly elevated from previous baseline, we'll continue to monitor.   Hollice Espy, MD  [facility] [address] [place], [place] [address] [phone]

## 2015-12-10 NOTE — Op Note (Signed)
Date of procedure: 12/10/2015  Preoperative diagnosis:  1. Right hydronephrosis 2. Gross hematuria   Postoperative diagnosis:  1. Right hydronephrosis 2. Gross hematuria 3. Obstructing right trigone/bladder neck mass 4. Left renal pelvic fullness   Procedure: 1. Cystoscopy 2. Left retrograde pyelogram 3. TURBT (medium) 4. Exam under anesthesia  Surgeon: Hollice Espy, MD  Anesthesia: General  Complications: None  Intraoperative findings: Nodular, irregular mucosalized bladder mass involving the right hemitrigone and bladder neck highly concerning for muscle invasive high-grade bladder tumor. Unable to identify right ureteral orifice for retrograde stenting.    EBL: 50 cc  Specimens: Bladder tumor (right trigone)  Drains: 20 French two-way Foley catheter  Indication: Tonya Moreno is a 82 y.o. patient with recurrent episodes of painless gross hematuria and somewhat chronic-appearing right hydroureteronephrosis on CT stone protocol .  After reviewing the management options for treatment, she elected to proceed with the above surgical procedure(s). We have discussed the potential benefits and risks of the procedure, side effects of the proposed treatment, the likelihood of the patient achieving the goals of the procedure, and any potential problems that might occur during the procedure or recuperation. Informed consent has been obtained.  Description of procedure:  The patient was taken to the operating room and general anesthesia was induced.  The patient was placed in the dorsal lithotomy position, prepped and draped in the usual sterile fashion, and preoperative antibiotics were administered. A preoperative time-out was performed.   At this point in time, a rigid 21 French cystoscope was advanced per urethra into the bladder and the bladder was carefully inspected. Immediately upon scope placement, it was evident that there was irregularity involving the bladder neck and  right hemitrigone. This is very concerning for nodular high-grade bladder tumor. There was no papillary appearance but rather had an abnormal contour and areas of superficial necrosis right at the bladder neck.  The right hemitrigone was distorted as a result of this tumor and the UO was unable to be immediately identified. The remainder of the bladder appeared relatively normal. The overall measurement of the tumor was proximally 2 cm in size.  Attention was then turned to the left ureteral orifice was cannulated using a 5 [place] open-ended ureteral catheter. Initially upon injection of the contrast, the contrast did not advance above the intramural ureter presumably due to J hooking. I then attempted to advance a sensor wire but was unsuccessful. I was ultimately successful in advancing an angled Glidewire up to level of the kidney. The ureter itself appeared very mildly dilated and tortuous proximally along with a mildly dilated left collecting system but no frank hydronephrosis. There was no ureteral filling defects or filling defects within the renal pelvis or calyces. The 5 [place] open-ended ureteral catheter was then removed and the kidney did drain adequately on several subsequent fluoroscopic images therefore no stent was placed.  Attention was then turned back to the right side and again the right UO was unable to be identified. At this point in time, an exam under anesthesia was performed. The nodular area within the bladder could be palpated through the anterior vaginal wall and was noted to be quite firm and fixed highly concerning for possibly T4 tumor at this location.  In addition, during the bimanual exam, there was fairly significant bleeding of the vaginal mucosa which was somewhat unusual. This eventually subsided spontaneously.  A bipolar resectoscope was then brought in and the tumor was resected although not completely. The resection was taken down to  level of the muscularis in several  areas. Resection was carried out using the cutting current only over the anticipated location of the right ureteral orifice. At one point, I did feel that I was able to visualize the UO and attempted to cannulate it with several wires but was unsuccessful. It appeared that the lumen with continued to be obstructed by tumor. At this point in time, careful hemostasis was achieved using coagulation but not over the area of the suspected right UO. Once this was adequate, 20 [place] two-way Foley catheter was placed.  The specimen was sent to pathology and labeled as right trigone bladder tumor. The patient was then cleaned and dried, repositioned the supine position, reversed from anesthesia. She was taken to the PACU in stable condition.  Of note, in the PACU her urine output was minimal. Stat labs showed florid renal failure which had progressed significantly over the past 6 days at the time of her preop labs. Stat internal medicine consult was called in the case was also discussed with interventional radiology and we are arranging for right percutaneous nephrostomy tube today.    Findings and the plan were discussed with her family as well today.  Hollice Espy, M.D.

## 2015-12-10 NOTE — Progress Notes (Signed)
MEDICATION RELATED CONSULT NOTE - INITIAL   Pharmacy Consult for Abx renal dose adjustment Indication: renal function  Allergies  Allergen Reactions  . Hydralazine Palpitations    Patient Measurements:     Vital Signs: Temp: 98.5 F (36.9 C) (02/13 1231) Temp Source: Oral (02/13 1231) BP: 117/54 mmHg (02/13 1231) Pulse Rate: 53 (02/13 1231) Intake/Output from previous day:   Intake/Output from this shift: Total I/O In: 1300 [P.O.:100; I.V.:1200] Out: -   Labs:  Recent Labs  12/10/15 1116  CREATININE 8.97*   Estimated Creatinine Clearance: 4.6 mL/min (by C-G formula based on Cr of 8.97).   Microbiology: Recent Results (from the past 720 hour(s))  Microscopic Examination     Status: Abnormal   Collection Time: 11/27/15  9:51 AM  Result Value Ref Range Status   WBC, UA 0-5 0 -  5 /hpf Final   RBC, UA >30 (H) 0 -  2 /hpf Final   Epithelial Cells (non renal) 0-10 0 - 10 /hpf Final   Mucus, UA Present (A) Not Estab. Final   Bacteria, UA Many (A) None seen/Few Final  CULTURE, URINE COMPREHENSIVE     Status: None   Collection Time: 11/27/15 10:23 AM  Result Value Ref Range Status   Urine Culture, Comprehensive Final report  Final   Result 1 Comment  Final    Comment: Mixed urogenital flora 1,000 Colonies/mL   Urine culture     Status: None   Collection Time: 12/04/15  6:21 PM  Result Value Ref Range Status   Specimen Description URINE, RANDOM  Final   Special Requests NONE  Final   Culture MULTIPLE SPECIES PRESENT, SUGGEST RECOLLECTION  Final   Report Status 12/06/2015 FINAL  Final     Assessment: 82 yo female s/p TURP with elevated Scr of 8.97.  Plan:  Spoke with Dr. Erlene Quan on the phone. With SCr elevated, for cefazolin dosing for CrCl ?10 mL/minute: recommended to administer 50% of usual dose every 18 to 24 hours. Deemed that intraoperative dose is sufficient at this time- d/c'd cefazolin post-op orders per MD.  Will also d/c quinapril order for now  per MD orders.    Rocky Morel 12/10/2015,1:38 PM

## 2015-12-10 NOTE — Anesthesia Procedure Notes (Signed)
Procedure Name: LMA Insertion Date/Time: 12/10/2015 7:42 AM Performed by: Doreen Salvage Pre-anesthesia Checklist: Patient identified, Patient being monitored, Timeout performed, Emergency Drugs available and Suction available Patient Re-evaluated:Patient Re-evaluated prior to inductionOxygen Delivery Method: Circle system utilized Preoxygenation: Pre-oxygenation with 100% oxygen Intubation Type: IV induction Ventilation: Mask ventilation without difficulty LMA: LMA inserted LMA Size: 3.5 Tube type: Oral Number of attempts: 1 Placement Confirmation: positive ETCO2 and breath sounds checked- equal and bilateral Tube secured with: Tape Dental Injury: Teeth and Oropharynx as per pre-operative assessment

## 2015-12-10 NOTE — Anesthesia Preprocedure Evaluation (Addendum)
Anesthesia Evaluation  Patient identified by MRN, date of birth, ID band Patient awake    Reviewed: Allergy & Precautions, NPO status , Patient's Chart, lab work & pertinent test results  Airway Mallampati: IV  TM Distance: >3 FB Neck ROM: Limited    Dental  (+) Edentulous Upper, Edentulous Lower   Pulmonary shortness of breath and with exertion,    Pulmonary exam normal        Cardiovascular Exercise Tolerance: Poor hypertension, Pt. on medications Normal cardiovascular exam     Neuro/Psych    GI/Hepatic   Endo/Other    Renal/GU      Musculoskeletal  (+) Arthritis , Osteoarthritis,    Abdominal (+)  Abdomen: soft.    Peds  Hematology   Anesthesia Other Findings   Reproductive/Obstetrics                            Anesthesia Physical Anesthesia Plan  ASA: III  Anesthesia Plan: General   Post-op Pain Management:    Induction: Intravenous  Airway Management Planned: LMA  Additional Equipment:   Intra-op Plan:   Post-operative Plan: Extubation in OR  Informed Consent: I have reviewed the patients History and Physical, chart, labs and discussed the procedure including the risks, benefits and alternatives for the proposed anesthesia with the patient or authorized representative who has indicated his/her understanding and acceptance.     Plan Discussed with: CRNA  Anesthesia Plan Comments: (Hb 10.4, mild LVH on ECG.)        Anesthesia Quick Evaluation

## 2015-12-10 NOTE — Procedures (Signed)
Procedure and risks discussed with patient and family and informed consent obtained. Under flouroscopic and ultrasound guidance, 8.79F nephrostomy catheter placed into right kidney. No immediate complication.

## 2015-12-10 NOTE — Consult Note (Signed)
Reason for Consult: No chief complaint on file.  Referring Physician: dr.Brandon  Tonya Moreno is an 82 y.o. female.  HPI: Patient is a 82 year old pleasant African-American female with a past medical history of osteoarthritis and hypertension came in for surgery today  with a diagnosis of gross hematuria and right-sided hydronephrosis.patient had cystoscopy and diagnosed with a bladder tumor which was resected followed by right-sided nephrostomy tube placement. Hospitalist team is consulted for acute kidney failure and hyponatremia. Patient was seen in the recovery room after procedure. Pain was well controlled. Denies any history of cancers in the past.   Past Medical History  Diagnosis Date  . Hypertension   . Shortness of breath dyspnea     doe  . Arthritis   . Complication of anesthesia     tonsillectomy age 36 bad experience with mask and hard to wake up  . Enlarged kidney   . Hernia of abdominal wall 12/04/2015    left side    Past Surgical History  Procedure Laterality Date  . Tonsillectomy      Family History  Problem Relation Age of Onset  . Cancer Mother   . Prostate cancer Neg Hx   . Bladder Cancer Neg Hx   . Kidney cancer Neg Hx     Social History:  reports that she has never smoked. She has never used smokeless tobacco. She reports that she does not drink alcohol or use illicit drugs.  Allergies:  Allergies  Allergen Reactions  . Hydralazine Palpitations    Medications: I have reviewed the patient's current medications.  Results for orders placed or performed during the hospital encounter of 12/10/15 (from the past 48 hour(s))  Basic metabolic panel     Status: Abnormal   Collection Time: 12/10/15 11:16 AM  Result Value Ref Range   Sodium 124 (L) 135 - 145 mmol/L   Potassium 4.3 3.5 - 5.1 mmol/L   Chloride 92 (L) 101 - 111 mmol/L   CO2 15 (L) 22 - 32 mmol/L   Glucose, Bld 71 65 - 99 mg/dL   BUN 62 (H) 6 - 20 mg/dL   Creatinine, Ser 8.97 (H) 0.44  - 1.00 mg/dL   Calcium 8.7 (L) 8.9 - 10.3 mg/dL   GFR calc non Af Amer 4 (L) >60 mL/min   GFR calc Af Amer 4 (L) >60 mL/min    Comment: (NOTE) The eGFR has been calculated using the CKD EPI equation. This calculation has not been validated in all clinical situations. eGFR's persistently <60 mL/min signify possible Chronic Kidney Disease.    Anion gap 17 (H) 5 - 15    [place] Intraoperative  12/10/2015  CLINICAL DATA:  Ultrasound was provided for use by the ordering physician, and a technical charge was applied by the performing facility.  No radiologist interpretation/professional services rendered.    ROS:  CONSTITUTIONAL: Denies fevers, chills. Denies any fatigue, weakness.  EYES: Denies blurry vision, double vision, eye pain. EARS, NOSE, THROAT: Denies tinnitus, ear pain, hearing loss. RESPIRATORY: Denies cough, wheeze, shortness of breath.  CARDIOVASCULAR: Denies chest pain, palpitations, edema.  GASTROINTESTINAL: Denies nausea, vomiting, diarrhea, abdominal pain. Denies bright red blood per rectum. GENITOURINARY: Denies dysuria,, reports gross hematuria. ENDOCRINE: Denies nocturia or thyroid problems. HEMATOLOGIC AND LYMPHATIC: Denies easy bruising or bleeding. SKIN: Denies rash or lesion. MUSCULOSKELETAL: Denies pain in neck, back, shoulder, knees, hips or arthritic symptoms.  NEUROLOGIC: Denies paralysis, paresthesias.  PSYCHIATRIC: Denies anxiety or depressive symptoms. Blood pressure 120/47, pulse 58, temperature 97.8  F (36.6 C), temperature source Oral, resp. rate 16, height _0  (1.651 m), weight 73.846 kg (162 lb 12.8 oz), SpO2 100 %.   PHYSICAL EXAMINATION:  GENERAL: Well-nourished, well-developed , currently in no acute distress.  HEAD: Normocephalic, atraumatic.  EYES: Pupils equal, round, and reactive to light. Extraocular muscles intact. No scleral icterus.  MOUTH: Moist mucosal membranes. Dentition intact. No abscess noted. EARS, NOSE, THROAT: Clear without  exudates. No external lesions.  NECK: Supple. No thyromegaly. No nodules. No JVD.  PULMONARY: Clear to auscultation bilaterally without wheezes, rales, or rhonchi. No use of accessory muscles. Good respiratory effort. CHEST: Nontender to palpation.  CARDIOVASCULAR: S1, S2, regular rate and rhythm. No murmurs, rubs, or gallops.  GASTROINTESTINAL: Soft, nontender, nondistended. No masses. Positive bowel sounds. No hepatosplenomegaly. Right-sided nephrostomy tube, uro-bag with hematuria.reducible ventral wall hernia  MUSCULOSKELETAL: No swelling, clubbing, edema. Range of motion full in all extremities. NEUROLOGIC: Cranial nerves II-XII intact. No gross focal neurological deficits. Sensation intact. Reflexes intact. SKIN: No ulcerations, lesions, rash, cyanosis. Skin warm, dry. Turgor intact. PSYCHIATRIC: Mood, affect within normal limits. Patient awake, alert, oriented x 3. Insight and judgment intact.   Assessment/Plan:  #Acute renal failure with gross hematuria post obstructive etiology with bladder tumor and right-sided hydronephrosis  Status post tumor resection and nephrostomy tube placement by Dr. Erlene Quan Monitor hemoglobin and hematocrit as patient is having gross hematuria Monitor BMP-we'll check every 6 hours Monitor intake and output  #Acute renal failure with metabolic acidosis  Aggressive hydration with IV fluids-started on sodium bicarbonate Monitor BMP every 6 hours-stat BMP is ordered which is pending at this time Nephrology consult is placed and discussed with Dr.:Kolluru  #Hyponatremia  -dehydration versus SIADH are possible differentials Provide IV fluids and check BMP Mentating fine  #Essential hypertension Patient takes quinapril and amlodipine at home Blood pressure is stable. Continue amlodipine and hold off on quinapril at this time in view of acute renal failure   #reducible anterior abdominal wall hernia-monitor closely. No interventions needed at this  time   DVT prophylaxis with SCDs   Thank you Dr. Erlene Quan for consulting hospitalist team for medical management including hyponatremia  Plan of care discussed with the family members including sons, daughters, daughter-in-law and grandchildren. They're aware of the plan TOTAL TIME TAKING CARE OF THIS PATIENT: 50 minutes.  _1 @ Pager - [phone] 12/10/2015, 6:45 PM

## 2015-12-11 ENCOUNTER — Other Ambulatory Visit: Payer: Medicare Other

## 2015-12-11 ENCOUNTER — Encounter: Payer: Self-pay | Admitting: Urology

## 2015-12-11 DIAGNOSIS — D638 Anemia in other chronic diseases classified elsewhere: Secondary | ICD-10-CM | POA: Diagnosis present

## 2015-12-11 DIAGNOSIS — N183 Chronic kidney disease, stage 3 (moderate): Secondary | ICD-10-CM | POA: Diagnosis present

## 2015-12-11 DIAGNOSIS — M1990 Unspecified osteoarthritis, unspecified site: Secondary | ICD-10-CM | POA: Diagnosis present

## 2015-12-11 DIAGNOSIS — R310 Gross hematuria: Secondary | ICD-10-CM | POA: Diagnosis present

## 2015-12-11 DIAGNOSIS — Z809 Family history of malignant neoplasm, unspecified: Secondary | ICD-10-CM | POA: Diagnosis not present

## 2015-12-11 DIAGNOSIS — N170 Acute kidney failure with tubular necrosis: Secondary | ICD-10-CM | POA: Diagnosis present

## 2015-12-11 DIAGNOSIS — E872 Acidosis: Secondary | ICD-10-CM | POA: Diagnosis present

## 2015-12-11 DIAGNOSIS — N131 Hydronephrosis with ureteral stricture, not elsewhere classified: Secondary | ICD-10-CM | POA: Diagnosis present

## 2015-12-11 DIAGNOSIS — N1330 Unspecified hydronephrosis: Secondary | ICD-10-CM | POA: Diagnosis not present

## 2015-12-11 DIAGNOSIS — N179 Acute kidney failure, unspecified: Secondary | ICD-10-CM

## 2015-12-11 DIAGNOSIS — N3289 Other specified disorders of bladder: Secondary | ICD-10-CM | POA: Diagnosis not present

## 2015-12-11 DIAGNOSIS — K439 Ventral hernia without obstruction or gangrene: Secondary | ICD-10-CM | POA: Diagnosis present

## 2015-12-11 DIAGNOSIS — E222 Syndrome of inappropriate secretion of antidiuretic hormone: Secondary | ICD-10-CM | POA: Diagnosis present

## 2015-12-11 DIAGNOSIS — D259 Leiomyoma of uterus, unspecified: Secondary | ICD-10-CM | POA: Diagnosis present

## 2015-12-11 DIAGNOSIS — Z7982 Long term (current) use of aspirin: Secondary | ICD-10-CM | POA: Diagnosis not present

## 2015-12-11 DIAGNOSIS — D494 Neoplasm of unspecified behavior of bladder: Secondary | ICD-10-CM | POA: Diagnosis present

## 2015-12-11 DIAGNOSIS — N132 Hydronephrosis with renal and ureteral calculous obstruction: Secondary | ICD-10-CM | POA: Diagnosis not present

## 2015-12-11 DIAGNOSIS — I129 Hypertensive chronic kidney disease with stage 1 through stage 4 chronic kidney disease, or unspecified chronic kidney disease: Secondary | ICD-10-CM | POA: Diagnosis present

## 2015-12-11 DIAGNOSIS — Z87891 Personal history of nicotine dependence: Secondary | ICD-10-CM | POA: Diagnosis not present

## 2015-12-11 DIAGNOSIS — E871 Hypo-osmolality and hyponatremia: Secondary | ICD-10-CM | POA: Diagnosis present

## 2015-12-11 HISTORY — DX: Acute kidney failure, unspecified: N17.9

## 2015-12-11 LAB — BASIC METABOLIC PANEL
Anion gap: 15 (ref 5–15)
Anion gap: 19 — ABNORMAL HIGH (ref 5–15)
BUN: 65 mg/dL — AB (ref 6–20)
BUN: 63 mg/dL — AB (ref 6–20)
CHLORIDE: 92 mmol/L — AB (ref 101–111)
CHLORIDE: 86 mmol/L — AB (ref 101–111)
CO2: 17 mmol/L — AB (ref 22–32)
CO2: 20 mmol/L — AB (ref 22–32)
CREATININE: 8.96 mg/dL — AB (ref 0.44–1.00)
Calcium: 8.5 mg/dL — ABNORMAL LOW (ref 8.9–10.3)
Calcium: 8.2 mg/dL — ABNORMAL LOW (ref 8.9–10.3)
Creatinine, Ser: 8.97 mg/dL — ABNORMAL HIGH (ref 0.44–1.00)
GFR calc Af Amer: 4 mL/min — ABNORMAL LOW (ref >60)
GFR calc Af Amer: 4 mL/min — ABNORMAL LOW (ref >60)
GFR calc non Af Amer: 4 mL/min — ABNORMAL LOW (ref >60)
GFR calc non Af Amer: 4 mL/min — ABNORMAL LOW (ref >60)
Glucose, Bld: 160 mg/dL — ABNORMAL HIGH (ref 65–99)
Glucose, Bld: 160 mg/dL — ABNORMAL HIGH (ref 65–99)
POTASSIUM: 4.9 mmol/L (ref 3.5–5.1)
POTASSIUM: 4.3 mmol/L (ref 3.5–5.1)
SODIUM: 124 mmol/L — AB (ref 135–145)
SODIUM: 125 mmol/L — AB (ref 135–145)

## 2015-12-11 LAB — CBC
HCT: 28.7 % — ABNORMAL LOW (ref 35.0–47.0)
HEMOGLOBIN: 9.7 g/dL — AB (ref 12.0–16.0)
MCH: 29 pg (ref 26.0–34.0)
MCHC: 33.8 g/dL (ref 32.0–36.0)
MCV: 85.9 fL (ref 80.0–100.0)
PLATELETS: 242 10*3/uL (ref 150–440)
RBC: 3.35 MIL/uL — AB (ref 3.80–5.20)
RDW: 14.4 % (ref 11.5–14.5)
WBC: 9.6 10*3/uL (ref 3.6–11.0)

## 2015-12-11 LAB — PROTIME-INR
INR: 1.33
Prothrombin Time: 16.6 seconds — ABNORMAL HIGH (ref 11.4–15.0)

## 2015-12-11 MED ORDER — PNEUMOCOCCAL VAC POLYVALENT 25 MCG/0.5ML IJ INJ: 0.5000 mL | INJECTION | INTRAMUSCULAR | Status: DC

## 2015-12-11 MED ORDER — DEXTROSE 5 % IV SOLN
INTRAVENOUS | Status: DC
  Administered 2015-12-11 – 2015-12-13 (×3): via INTRAVENOUS
  Filled 2015-12-11 (×4): qty 150

## 2015-12-11 MED ORDER — SODIUM CHLORIDE 0.9 % IV SOLN: INTRAVENOUS | Status: DC

## 2015-12-11 NOTE — Progress Notes (Signed)
Urology Follow Up  Subjective: Right percutaneous nephrostomy tube placed yesterday evening without difficulty. Urine output per Foley relatively low but good right nephrostomy output. Creatinine only minimally improved this a.m. Patient feels overall well, no complaints other than some mild nausea. Internal medicine and nephrology following.  Anti-infectives: Anti-infectives    Start     Dose/Rate Route Frequency Ordered Stop   12/10/15 1545  ceFAZolin (ANCEF) IVPB 1 g/50 mL premix     1 g 100 mL/hr over 30 Minutes Intravenous  Once 12/10/15 1541 12/10/15 1657   12/10/15 1245  ceFAZolin (ANCEF) IVPB 1 g/50 mL premix  Status:  Discontinued     1 g 100 mL/hr over 30 Minutes Intravenous Every 8 hours 12/10/15 1232 12/10/15 1337   12/10/15 0611  ceFAZolin (ANCEF) 2-3 GM-% IVPB SOLR    Comments:  Rexanne Mano: cabinet override      12/10/15 0611 12/10/15 1814   12/10/15 0600  ceFAZolin (ANCEF) IVPB 2 g/50 mL premix     2 g 100 mL/hr over 30 Minutes Intravenous 30 min pre-op 12/10/15 0600 12/10/15 0730      Current Facility-Administered Medications  Medication Dose Route Frequency Provider Last Rate Last Dose  . 0.9 %  sodium chloride infusion   Intravenous Continuous Sabino Dick, MD 10 mL/hr at 12/10/15 1538    . 0.9 %  sodium chloride infusion   Intravenous Continuous Demetrios Loll, MD   Stopped at 12/11/15 1045  . acetaminophen (TYLENOL) tablet 650 mg  650 mg Oral Q4H PRN Hollice Espy, MD      . amLODipine (NORVASC) tablet 10 mg  10 mg Oral Daily Hollice Espy, MD   10 mg at 12/11/15 1138  . aspirin EC tablet 81 mg  81 mg Oral Maryclare Labrador, MD   81 mg at 12/11/15 [id number]  . cholecalciferol (VITAMIN D) tablet 4,000 Units  4,000 Units Oral Daily Hollice Espy, MD   4,000 Units at 12/11/15 1138  . diphenhydrAMINE (BENADRYL) injection 12.5 mg  12.5 mg Intravenous Q6H PRN Hollice Espy, MD       Or  . diphenhydrAMINE (BENADRYL) 12.5 MG/5ML elixir 12.5 mg  12.5 mg Oral Q6H PRN  Hollice Espy, MD      . docusate sodium (COLACE) capsule 200 mg  200 mg Oral BID Hollice Espy, MD   200 mg at 12/11/15 1138  . lidocaine (PF) (XYLOCAINE) 1 % injection    PRN Sabino Dick, MD   6 mL at 12/10/15 1707  . morphine 2 MG/ML injection 2-4 mg  2-4 mg Intravenous Q2H PRN Hollice Espy, MD      . ondansetron [facility]) injection 4 mg  4 mg Intravenous Q4H PRN Hollice Espy, MD      . opium-belladonna (B&O SUPPRETTES) 16.2-60 MG suppository 1 suppository  1 suppository Rectal Q6H PRN Hollice Espy, MD      . oxybutynin (DITROPAN) tablet 5 mg  5 mg Oral Q8H PRN Hollice Espy, MD      . oxyCODONE (Oxy IR/ROXICODONE) immediate release tablet 5 mg  5 mg Oral Q4H PRN Hollice Espy, MD      . sodium bicarbonate 150 mEq in dextrose 5 % 1,000 mL infusion   Intravenous Continuous Lavonia Dana, MD 50 mL/hr at 12/11/15 1137    . Vitamin D3 CAPS 50,000 Units  50,000 Units Oral Weekly Hollice Espy, MD         Objective: Vital signs in last 24 hours: Temp:  [97.8 F (36.6 C)-98.8 F (37.1 C)]  98.8 F (37.1 C) (02/14 0450) Pulse Rate:  [57-80] 68 (02/14 0450) Resp:  [12-18] 18 (02/14 0450) BP: (111-189)/(47-69) 145/57 mmHg (02/14 0450) SpO2:  [98 %-100 %] 100 % (02/14 0450) Weight:  [162 lb 12.8 oz (73.846 kg)] 162 lb 12.8 oz (73.846 kg) (02/13 1525)  Intake/Output from previous day: 02/13 0701 - 02/14 0700 In: 1300 [P.O.:100; I.V.:1200] Out: 1440 [Urine:1440] Intake/Output this shift: Total I/O In: -  Out: 850 [Urine:850]   Physical Exam  Constitutional: She is oriented to person, place, and time and well-developed, well-nourished, and in no distress.  Innumerable family members at bedside  HENT:  Head: Normocephalic and atraumatic.  Neck: Normal range of motion.  Cardiovascular: Normal rate.   Abdominal: Soft. Bowel sounds are normal.  Reducible abdominal wall hernia  Genitourinary:  Urethral Foley draining scant bloody urine. Right nephrostomy draining light pink  urine, and no clots.  Musculoskeletal: Normal range of motion.  Neurological: She is alert and oriented to person, place, and time.  Skin: Skin is warm and dry.  Psychiatric: Affect and judgment normal.    Lab Results:   Recent Labs  12/10/15 1120 12/11/15 0556  WBC 6.1 9.6  HGB 11.1* 9.7*  HCT 32.9* 28.7*  PLT 231 242   BMET  Recent Labs  12/11/15 0125 12/11/15 0556  NA 124* 125*  K 4.9 4.3  CL 92* 86*  CO2 17* 20*  GLUCOSE 160* 160*  BUN 65* 63*  CREATININE 8.96* 8.97*  CALCIUM 8.5* 8.2*   PT/INR  Recent Labs  12/11/15 0556  LABPROT 16.6*  INR 1.33   Studies/Results: [place] Intraoperative  12/10/2015  CLINICAL DATA:  Ultrasound was provided for use by the ordering physician, and a technical charge was applied by the performing facility.  No radiologist interpretation/professional services rendered.   Ir  Nephroureteral Cath Place Right  12/11/2015  INDICATION: Right hydronephrosis secondary to ureteral obstruction. EXAM: IR NEPHROURETERAL CATH PLACEMENT RIGHT; ULTRAOUND INTRAOPERATIVE - NRPT [facility] COMPARISON:  CT scan of October 24, 2015. MEDICATIONS: 1 g Ancef IV; The antibiotic was administered in an appropriate time frame prior to skin puncture. ANESTHESIA/SEDATION: Fentanyl 50 mcg IV; Versed 2.0 mg IV Moderate Sedation Time:  15 minutes. The patient was continuously monitored during the procedure by the interventional radiology nurse under my direct supervision. CONTRAST:  4mL OMNIPAQUE IOHEXOL 300 MG/ML SOLN - administered into the collecting system(s) FLUOROSCOPY TIME:  Fluoroscopy Time: 2 minutes 30 seconds (391.25 mGy). COMPLICATIONS: None immediate. PROCEDURE: Informed written consent was obtained from the patient after a thorough discussion of the procedural risks, benefits and alternatives. All questions were addressed. Maximal Sterile Barrier Technique was utilized including caps, mask, sterile gowns, sterile gloves, sterile drape, hand hygiene and skin  antiseptic. A timeout was performed prior to the initiation of the procedure. Patient was placed prone on angiographic table. Right flank region was prepped and draped using sterile technique. Local anesthetic was applied. Under real-time ultrasound guidance, 21 gauge needle was directed into dilated right intrarenal collecting system. Contrast under fluoroscopy was injected for antegrade nephrostogram, which demonstrates severe right hydronephrosis. Guidewire was placed under fluoroscopic guidance, followed by 8 [place] dilator. Then, 8.5 French nephrostomy catheter was passed over the wire, and its internal loop was internally secured. Under fluoroscopy, contrast was injected to confirm its position within the right renal pelvis. Dilatation of the right ureter was noted to the level of the sacroiliac joint, where it appears to be occluded. The external portion of the catheter was secured and  hooked up to external drainage. IMPRESSION: Under ultrasound and fluoroscopic guidance, successful placement of 8.5 French nephrostomy catheter into right renal pelvis. This was hooked up to external drainage. Electronically Signed   By: Marijo Conception, M.D.   On: 12/11/2015 08:02    Assessment: POD 1 s/p Procedure(s): CYSTOSCOPY WITH RETROGRADE PYELOGRAM TRANSURETHRAL RESECTION OF BLADDER TUMOR (TURBT) EXAM UNDER ANESTHESIA  Plan: F/u nephrology/ IM consult recs - continue NS +bicarb Trend BMP, UOP Flush Foley.  If UOP per Foley remains low, consider RUS tomorrow  Renal diet Awaiting pathology results Consider staging CT once pathology resulted   LOS: 0 days    Hollice Espy 12/11/2015

## 2015-12-11 NOTE — Progress Notes (Signed)
Initial Nutrition Assessment      INTERVENTION:  Meals and snacks: Cater to pt preferences. Pt may benefit from liberalizing diet to regular pending intake and labs Medical Nutrition Supplement Therapy: will add if intake not adequate on follow-up   NUTRITION DIAGNOSIS:   Inadequate oral intake related to acute illness as evidenced by per patient/family report, meal completion < 25%.    GOAL:   Patient will meet greater than or equal to 90% of their needs    MONITOR:    (Energy intake, Electrolyte and renal profile)  REASON FOR ASSESSMENT:   Malnutrition Screening Tool (dialysis pt)    ASSESSMENT:      Pt admitted with hematuria and ARF and hyponatremia.  Pt s/p bladder tumor resection with placement of right nephrostomy tube  Past Medical History  Diagnosis Date  . Hypertension   . Shortness of breath dyspnea     doe  . Arthritis   . Complication of anesthesia     tonsillectomy age 61 bad experience with mask and hard to wake up  . Enlarged kidney   . Hernia of abdominal wall 12/04/2015    left side    Current Nutrition: eaten few bites of applesauce over the last 3 days.   Food/Nutrition-Related History: Prior to last 3 days normal intake    Scheduled Medications:  . amLODipine  10 mg Oral Daily  . aspirin EC  81 mg Oral [id number]  . cholecalciferol  4,000 Units Oral Daily  . docusate sodium  200 mg Oral BID  . Vitamin D3  50,000 Units Oral Weekly    Continuous Medications:  . sodium chloride 10 mL/hr at 12/10/15 1538  . sodium chloride    .  sodium bicarbonate  infusion 1000 mL       Electrolyte/Renal Profile and Glucose Profile:   Recent Labs Lab 12/10/15 1834 12/11/15 0125 12/11/15 0556  NA 124* 124* 125*  K 4.9 4.9 4.3  CL 91* 92* 86*  CO2 15* 17* 20*  BUN 65* 65* 63*  CREATININE 9.29* 8.96* 8.97*  CALCIUM 8.7* 8.5* 8.2*  GLUCOSE 101* 160* 160*   Protein Profile:  Recent Labs Lab 12/04/15 1820  ALBUMIN 3.8     Gastrointestinal Profile: Last BM: unsure   Nutrition-Focused Physical Exam Findings: Nutrition-Focused physical exam completed. Findings are no fat depletion, no muscle depletion, and mild edema.      Weight Change: stable wt per dtr    Diet Order:  Diet renal/carb modified with fluid restriction Diet-HS Snack?: Nothing; Room service appropriate?: Yes; Fluid consistency:: Thin; Fluid restriction:: 1200 mL Fluid  Skin:   reviewed   Height:   Ht Readings from Last 1 Encounters:  12/10/15 5\' 5"  (1.651 m)    Weight:   Wt Readings from Last 1 Encounters:  12/10/15 162 lb 12.8 oz (73.846 kg)    Ideal Body Weight:     BMI:  Body mass index is 27.09 kg/(m^2).  Estimated Nutritional Needs:   Kcal:  BEE 1215 kcals (IF 1.1-1.2, AF 1.3) [id number] kcals/d  Protein:  (1.0-1.2 g/kg) 74-89 g/d  Fluid:  (25-18ml/kg) 1850-2242ml/d  EDUCATION NEEDS:   No education needs identified at this time  [place]. Zenia Resides, [place], [place] (pager) Weekend/On-Call pager [phone])

## 2015-12-11 NOTE — Consult Note (Signed)
[facility]  CONSULT NOTE    Date: 12/11/2015                  Patient Name:  Tonya Moreno  MRN: [id number]  DOB: 10/10/1934  Age / Sex: 82 y.o., female         PCP: Letta Median, MD                 Service Requesting Consult: Dr. Margaretmary Eddy                 Reason for Consult: Acute Renal Failure            History of Present Illness: Tonya Moreno is a 82 y.o. black female with hypertension, who was admitted to [facility] on 12/10/2015 for gross hematuria,right hydronephrosis  Patient underwent cystoscopy and right nephrostomy placement by Dr. Erlene Quan, urology, yesterday 2/13 for right sided sided hydronephrosis and gross hematuria.   Baseline creatinine from 12/04/15 of 1.84. She has not seen a nephrologist before. Nephrology consulted for acute renal failure with admission creatinine of 9.29 and no real improvement. With metabolic acidosis and hyponatremia.  Started and then stopped on sodium bicarbonate infusion.   Urine output of 1440 mL over night.   Medications: Outpatient medications: Prescriptions prior to admission  Medication Sig Dispense Refill Last Dose  . amLODipine (NORVASC) 10 MG tablet Take 10 mg by mouth daily. After lunch   12/10/2015 at 0500  . cephALEXin (KEFLEX) 500 MG capsule Take 1 capsule (500 mg total) by mouth 2 (two) times daily. 14 capsule 0 Past Week at Unknown time  . Cholecalciferol (VITAMIN D3) 50000 units CAPS Take 1 capsule by mouth once a week.   Past Week at Unknown time  . Cholecalciferol 4000 UNITS CAPS Take 4,000 Units by mouth daily.   Past Week at Unknown time  . docusate sodium (COLACE) 100 MG capsule Take 2 capsules (200 mg total) by mouth 2 (two) times daily. 120 capsule 0 12/09/2015 at Unknown time  . quinapril (ACCUPRIL) 20 MG tablet Take 20 mg by mouth 2 (two) times daily.   12/10/2015 at 0500  . aspirin 81 MG tablet Take 81 mg by mouth every morning.    12/02/2015    Current medications: Current  Facility-Administered Medications  Medication Dose Route Frequency Provider Last Rate Last Dose  . 0.9 %  sodium chloride infusion   Intravenous Continuous Sabino Dick, MD 10 mL/hr at 12/10/15 1538    . 0.9 %  sodium chloride infusion   Intravenous Continuous Demetrios Loll, MD      . acetaminophen (TYLENOL) tablet 650 mg  650 mg Oral Q4H PRN Hollice Espy, MD      . amLODipine (NORVASC) tablet 10 mg  10 mg Oral Daily Hollice Espy, MD      . aspirin EC tablet 81 mg  81 mg Oral Maryclare Labrador, MD   81 mg at 12/11/15 [id number]  . cholecalciferol (VITAMIN D) tablet 4,000 Units  4,000 Units Oral Daily Hollice Espy, MD   4,000 Units at 12/10/15 1245  . diphenhydrAMINE (BENADRYL) injection 12.5 mg  12.5 mg Intravenous Q6H PRN Hollice Espy, MD       Or  . diphenhydrAMINE (BENADRYL) 12.5 MG/5ML elixir 12.5 mg  12.5 mg Oral Q6H PRN Hollice Espy, MD      . docusate sodium (COLACE) capsule 200 mg  200 mg Oral BID Hollice Espy, MD   200 mg at 12/10/15 2201  .  lidocaine (PF) (XYLOCAINE) 1 % injection    PRN Sabino Dick, MD   6 mL at 12/10/15 1707  . morphine 2 MG/ML injection 2-4 mg  2-4 mg Intravenous Q2H PRN Hollice Espy, MD      . ondansetron [facility]) injection 4 mg  4 mg Intravenous Q4H PRN Hollice Espy, MD      . opium-belladonna (B&O SUPPRETTES) 16.2-60 MG suppository 1 suppository  1 suppository Rectal Q6H PRN Hollice Espy, MD      . oxybutynin (DITROPAN) tablet 5 mg  5 mg Oral Q8H PRN Hollice Espy, MD      . oxyCODONE (Oxy IR/ROXICODONE) immediate release tablet 5 mg  5 mg Oral Q4H PRN Hollice Espy, MD      . sodium bicarbonate 150 mEq in dextrose 5 % 1,000 mL infusion   Intravenous Continuous Lavonia Dana, MD      . Vitamin D3 CAPS 50,000 Units  50,000 Units Oral Weekly Hollice Espy, MD          Allergies: Allergies  Allergen Reactions  . Hydralazine Palpitations      Past Medical History: Past Medical History  Diagnosis Date  . Hypertension   . Shortness of breath  dyspnea     doe  . Arthritis   . Complication of anesthesia     tonsillectomy age 4 bad experience with mask and hard to wake up  . Enlarged kidney   . Hernia of abdominal wall 12/04/2015    left side  . Acute kidney failure ([place]) 12/11/2015     Past Surgical History: Past Surgical History  Procedure Laterality Date  . Tonsillectomy       Family History: Family History  Problem Relation Age of Onset  . Cancer Mother   . Prostate cancer Neg Hx   . Bladder Cancer Neg Hx   . Kidney cancer Neg Hx      Social History: Social History   Social History  . Marital Status: Single    Spouse Name: N/A  . Number of Children: N/A  . Years of Education: N/A   Occupational History  . Not on file.   Social History Main Topics  . Smoking status: Never Smoker   . Smokeless tobacco: Never Used  . Alcohol Use: No  . Drug Use: No  . Sexual Activity: Not on file   Other Topics Concern  . Not on file   Social History Narrative     Review of Systems: Review of Systems  Constitutional: Positive for weight loss and malaise/fatigue. Negative for fever and chills.  HENT: Negative.  Negative for congestion, ear discharge, ear pain, hearing loss, nosebleeds, sore throat and tinnitus.   Eyes: Negative.  Negative for blurred vision, double vision, photophobia, pain, discharge and redness.  Respiratory: Positive for shortness of breath and wheezing. Negative for cough, hemoptysis, sputum production and stridor.   Cardiovascular: Positive for leg swelling. Negative for chest pain, palpitations, orthopnea, claudication and PND.  Gastrointestinal: Positive for nausea. Negative for heartburn, vomiting, abdominal pain, diarrhea, constipation, blood in stool and melena.       Poor appetite  Genitourinary: Positive for dysuria, frequency, hematuria and flank pain. Negative for urgency.  Musculoskeletal: Negative for myalgias, back pain, joint pain, falls and neck pain.  Skin: Negative.   Negative for itching and rash.  Neurological: Positive for weakness. Negative for dizziness, tingling, tremors, sensory change, speech change, focal weakness, seizures, loss of consciousness and headaches.  Endo/Heme/Allergies: Negative for environmental allergies and polydipsia. Does  not bruise/bleed easily.  Psychiatric/Behavioral: Negative.  Negative for depression, suicidal ideas, hallucinations, memory loss and substance abuse. The patient is not nervous/anxious and does not have insomnia.     Vital Signs: Blood pressure 145/57, pulse 68, temperature 98.8 F (37.1 C), temperature source Oral, resp. rate 18, height 5\' 5"  (1.651 m), weight 73.846 kg (162 lb 12.8 oz), SpO2 100 %.  Weight trends: Filed Weights   12/10/15 1525  Weight: 73.846 kg (162 lb 12.8 oz)    Physical Exam: General: NAD, laying in bed  Head: Normocephalic, atraumatic. Moist oral mucosal membranes  Eyes: Anicteric, PERRL  Neck: Supple, trachea midline  Lungs:  Clear to auscultation  Heart: Regular rate and rhythm  Abdomen:  Soft, tender at left flank, right sided nephrostomy tub  Extremities: no peripheral edema.  Neurologic: Nonfocal, moving all four extremities  Skin: No lesions  GU: Right nephrostomy tube with bag with hematuria gross     Lab results: Basic Metabolic Panel:  Recent Labs Lab 12/10/15 1834 12/11/15 0125 12/11/15 0556  NA 124* 124* 125*  K 4.9 4.9 4.3  CL 91* 92* 86*  CO2 15* 17* 20*  GLUCOSE 101* 160* 160*  BUN 65* 65* 63*  CREATININE 9.29* 8.96* 8.97*  CALCIUM 8.7* 8.5* 8.2*    Liver Function Tests:  Recent Labs Lab 12/04/15 1820  AST 20  ALT 12*  ALKPHOS 86  BILITOT 0.7  PROT 7.1  ALBUMIN 3.8    Recent Labs Lab 12/04/15 1820  LIPASE 19   No results for input(s): AMMONIA in the last 168 hours.  CBC:  Recent Labs Lab 12/04/15 1820 12/10/15 1120 12/11/15 0556  WBC 8.1 6.1 9.6  HGB 10.4* 11.1* 9.7*  HCT 31.4* 32.9* 28.7*  MCV 87.8 86.9 85.9  PLT 214  231 242    Cardiac Enzymes: No results for input(s): CKTOTAL, CKMB, CKMBINDEX, TROPONINI in the last 168 hours.  BNP: Invalid input(s): POCBNP  CBG: No results for input(s): GLUCAP in the last 168 hours.  Microbiology: Results for orders placed or performed during the hospital encounter of 12/04/15  Urine culture     Status: None   Collection Time: 12/04/15  6:21 PM  Result Value Ref Range Status   Specimen Description URINE, RANDOM  Final   Special Requests NONE  Final   Culture MULTIPLE SPECIES PRESENT, SUGGEST RECOLLECTION  Final   Report Status 12/06/2015 FINAL  Final    Coagulation Studies:  Recent Labs  12/11/15 0556  LABPROT 16.6*  INR 1.33    Urinalysis: No results for input(s): COLORURINE, LABSPEC, PHURINE, GLUCOSEU, HGBUR, BILIRUBINUR, KETONESUR, PROTEINUR, UROBILINOGEN, NITRITE, LEUKOCYTESUR in the last 72 hours.  Invalid input(s): APPERANCEUR    Imaging: [place] Intraoperative  12/10/2015  CLINICAL DATA:  Ultrasound was provided for use by the ordering physician, and a technical charge was applied by the performing facility.  No radiologist interpretation/professional services rendered.   Ir  Nephroureteral Cath Place Right  12/11/2015  INDICATION: Right hydronephrosis secondary to ureteral obstruction. EXAM: IR NEPHROURETERAL CATH PLACEMENT RIGHT; ULTRAOUND INTRAOPERATIVE - NRPT [facility] COMPARISON:  CT scan of October 24, 2015. MEDICATIONS: 1 g Ancef IV; The antibiotic was administered in an appropriate time frame prior to skin puncture. ANESTHESIA/SEDATION: Fentanyl 50 mcg IV; Versed 2.0 mg IV Moderate Sedation Time:  15 minutes. The patient was continuously monitored during the procedure by the interventional radiology nurse under my direct supervision. CONTRAST:  18mL OMNIPAQUE IOHEXOL 300 MG/ML SOLN - administered into the collecting system(s) FLUOROSCOPY TIME:  Fluoroscopy Time: 2 minutes 30 seconds (391.25 mGy). COMPLICATIONS: None immediate. PROCEDURE: Informed  written consent was obtained from the patient after a thorough discussion of the procedural risks, benefits and alternatives. All questions were addressed. Maximal Sterile Barrier Technique was utilized including caps, mask, sterile gowns, sterile gloves, sterile drape, hand hygiene and skin antiseptic. A timeout was performed prior to the initiation of the procedure. Patient was placed prone on angiographic table. Right flank region was prepped and draped using sterile technique. Local anesthetic was applied. Under real-time ultrasound guidance, 21 gauge needle was directed into dilated right intrarenal collecting system. Contrast under fluoroscopy was injected for antegrade nephrostogram, which demonstrates severe right hydronephrosis. Guidewire was placed under fluoroscopic guidance, followed by 8 [place] dilator. Then, 8.5 French nephrostomy catheter was passed over the wire, and its internal loop was internally secured. Under fluoroscopy, contrast was injected to confirm its position within the right renal pelvis. Dilatation of the right ureter was noted to the level of the sacroiliac joint, where it appears to be occluded. The external portion of the catheter was secured and hooked up to external drainage. IMPRESSION: Under ultrasound and fluoroscopic guidance, successful placement of 8.5 French nephrostomy catheter into right renal pelvis. This was hooked up to external drainage. Electronically Signed   By: Marijo Conception, M.D.   On: 12/11/2015 08:02      Assessment & Plan: Tonya Moreno is a 82 y.o. black female with hypertension, who was admitted to [facility] on 12/10/2015 for gross hematuria,right hydronephrosis.  1. Acute renal failure: from obstructive uropathy and now possible ATN. Will monitor for improvement in urine output. Discussed dialysis with patient and daughter.  - no acute indication for dialysis at this time. Monitor urine output, volume status, electrolytes and renal function.    2. Metabolic acidosis: with acute renal failure and most likely RTA type IV from obstructive uropathy - start sodium bicarbonate for buffering.   3. Hyponatremia: with recent cystoscopy.  - IV sodium bicarbonate infusion as above. Monitor volume status.   4. Anemia: hemoglobin low. With gross hematuria.  - low threshold to start epo.     LOS: 0 Asra Gambrel 2/14/201711:20 AM

## 2015-12-11 NOTE — Progress Notes (Addendum)
[facility] [place] NAME: Tonya Moreno    MR#:  [id number]  DATE OF BIRTH:  11-Mar-1934  SUBJECTIVE:  CHIEF COMPLAINT:  No chief complaint on file.  Hematuria from Foley cath. REVIEW OF SYSTEMS:  CONSTITUTIONAL: No fever, fatigue or weakness.  EYES: No blurred or double vision.  EARS, NOSE, AND THROAT: No tinnitus or ear pain.  RESPIRATORY: No cough, shortness of breath, wheezing or hemoptysis.  CARDIOVASCULAR: No chest pain, orthopnea, edema.  GASTROINTESTINAL: No nausea, vomiting, diarrhea or abdominal pain.  GENITOURINARY: No dysuria, hematuria.  ENDOCRINE: No polyuria, nocturia,  HEMATOLOGY: No anemia, easy bruising or bleeding SKIN: No rash or lesion. MUSCULOSKELETAL: No joint pain or arthritis.   NEUROLOGIC: No tingling, numbness, weakness.  PSYCHIATRY: No anxiety or depression.   DRUG ALLERGIES:   Allergies  Allergen Reactions  . Hydralazine Palpitations    VITALS:  Blood pressure 145/57, pulse 68, temperature 98.8 F (37.1 C), temperature source Oral, resp. rate 18, height 5\' 5"  (1.651 m), weight 73.846 kg (162 lb 12.8 oz), SpO2 100 %.  PHYSICAL EXAMINATION:  GENERAL:  82 y.o.-year-old patient lying in the bed with no acute distress.  EYES: Pupils equal, round, reactive to light and accommodation. No scleral icterus. Extraocular muscles intact.  HEENT: Head atraumatic, normocephalic. Oropharynx and nasopharynx clear.  NECK:  Supple, no jugular venous distention. No thyroid enlargement, no tenderness.  LUNGS: Normal breath sounds bilaterally, no wheezing, rales,rhonchi or crepitation. No use of accessory muscles of respiration.  CARDIOVASCULAR: S1, S2 normal. No murmurs, rubs, or gallops.  ABDOMEN: Soft, nontender, nondistended. Bowel sounds present. No organomegaly or mass.  EXTREMITIES: No pedal edema, cyanosis, or clubbing.  NEUROLOGIC: Cranial nerves II through XII are intact. Muscle strength 5/5 in all  extremities. Sensation intact. Gait not checked.  PSYCHIATRIC: The patient is alert and oriented x 3.  SKIN: No obvious rash, lesion, or ulcer.    LABORATORY PANEL:   CBC  Recent Labs Lab 12/11/15 0556  WBC 9.6  HGB 9.7*  HCT 28.7*  PLT 242   ------------------------------------------------------------------------------------------------------------------  Chemistries   Recent Labs Lab 12/04/15 1820  12/11/15 0556  NA 135  < > 125*  K 3.8  < > 4.3  CL 103  < > 86*  CO2 22  < > 20*  GLUCOSE 119*  < > 160*  BUN 26*  < > 63*  CREATININE 1.84*  < > 8.97*  CALCIUM 9.9  < > 8.2*  AST 20  --   --   ALT 12*  --   --   ALKPHOS 86  --   --   BILITOT 0.7  --   --   < > = values in this interval not displayed. ------------------------------------------------------------------------------------------------------------------  Cardiac Enzymes No results for input(s): TROPONINI in the last 168 hours. ------------------------------------------------------------------------------------------------------------------  RADIOLOGY:  [place] Intraoperative  12/10/2015  CLINICAL DATA:  Ultrasound was provided for use by the ordering physician, and a technical charge was applied by the performing facility.  No radiologist interpretation/professional services rendered.   Ir  Nephroureteral Cath Place Right  12/11/2015  INDICATION: Right hydronephrosis secondary to ureteral obstruction. EXAM: IR NEPHROURETERAL CATH PLACEMENT RIGHT; ULTRAOUND INTRAOPERATIVE - NRPT [facility] COMPARISON:  CT scan of October 24, 2015. MEDICATIONS: 1 g Ancef IV; The antibiotic was administered in an appropriate time frame prior to skin puncture. ANESTHESIA/SEDATION: Fentanyl 50 mcg IV; Versed 2.0 mg IV Moderate Sedation Time:  15 minutes. The patient was continuously monitored during  the procedure by the interventional radiology nurse under my direct supervision. CONTRAST:  63mL OMNIPAQUE IOHEXOL 300 MG/ML SOLN - administered  into the collecting system(s) FLUOROSCOPY TIME:  Fluoroscopy Time: 2 minutes 30 seconds (391.25 mGy). COMPLICATIONS: None immediate. PROCEDURE: Informed written consent was obtained from the patient after a thorough discussion of the procedural risks, benefits and alternatives. All questions were addressed. Maximal Sterile Barrier Technique was utilized including caps, mask, sterile gowns, sterile gloves, sterile drape, hand hygiene and skin antiseptic. A timeout was performed prior to the initiation of the procedure. Patient was placed prone on angiographic table. Right flank region was prepped and draped using sterile technique. Local anesthetic was applied. Under real-time ultrasound guidance, 21 gauge needle was directed into dilated right intrarenal collecting system. Contrast under fluoroscopy was injected for antegrade nephrostogram, which demonstrates severe right hydronephrosis. Guidewire was placed under fluoroscopic guidance, followed by 8 [place] dilator. Then, 8.5 French nephrostomy catheter was passed over the wire, and its internal loop was internally secured. Under fluoroscopy, contrast was injected to confirm its position within the right renal pelvis. Dilatation of the right ureter was noted to the level of the sacroiliac joint, where it appears to be occluded. The external portion of the catheter was secured and hooked up to external drainage. IMPRESSION: Under ultrasound and fluoroscopic guidance, successful placement of 8.5 French nephrostomy catheter into right renal pelvis. This was hooked up to external drainage. Electronically Signed   By: Marijo Conception, M.D.   On: 12/11/2015 08:02    EKG:   Orders placed or performed during the hospital encounter of 11/01/15  . EKG 12-Lead  . EKG 12-Lead    ASSESSMENT AND PLAN:   1. Acute renal failure with gross hematuria post obstructive etiology with bladder tumor and right-sided hydronephrosis and now possible ATN. Status post tumor  resection and nephrostomy tube placement by Dr. Erlene Quan. no acute indication for dialysis at this time per Dr. Juleen China. F/u BMP.  2. Metabolic acidosis. Treated with bicarb iv. Improved.  3. Hyponatremia. Was on Bicarb iv, now on NS,  f/u BMP.  4. Essential hypertension. Hold quinapril and continue amlodipine.   All the records are reviewed and case discussed with Care Management/Social Workerr. Management plans discussed with the patient, her daughter and they are in agreement.  CODE STATUS:   TOTAL TIME TAKING CARE OF THIS PATIENT: 41 minutes.  Greater than 50% time was spent on coordination of care and face-to-face counseling.  POSSIBLE D/C IN 3 DAYS, DEPENDING ON CLINICAL CONDITION.   Demetrios Loll M.D on 12/11/2015 at 2:48 PM  Between 7am to 6pm - Pager - [phone]  After 6pm go to [email or web address] - password [email or web address]  Office  [phone]  CC: Primary care physician; Letta Median, MD

## 2015-12-12 ENCOUNTER — Inpatient Hospital Stay: Payer: Medicare Other

## 2015-12-12 DIAGNOSIS — N179 Acute kidney failure, unspecified: Secondary | ICD-10-CM

## 2015-12-12 LAB — CBC
HEMATOCRIT: 28.4 % — AB (ref 35.0–47.0)
HEMOGLOBIN: 9.8 g/dL — AB (ref 12.0–16.0)
MCH: 29.2 pg (ref 26.0–34.0)
MCHC: 34.5 g/dL (ref 32.0–36.0)
MCV: 84.5 fL (ref 80.0–100.0)
Platelets: 223 10*3/uL (ref 150–440)
RBC: 3.36 MIL/uL — ABNORMAL LOW (ref 3.80–5.20)
RDW: 14.3 % (ref 11.5–14.5)
WBC: 10.1 10*3/uL (ref 3.6–11.0)

## 2015-12-12 LAB — BASIC METABOLIC PANEL
ANION GAP: 12 (ref 5–15)
BUN: 60 mg/dL — AB (ref 6–20)
CO2: 24 mmol/L (ref 22–32)
Calcium: 8.2 mg/dL — ABNORMAL LOW (ref 8.9–10.3)
Chloride: 93 mmol/L — ABNORMAL LOW (ref 101–111)
Creatinine, Ser: 8.82 mg/dL — ABNORMAL HIGH (ref 0.44–1.00)
GFR calc Af Amer: 4 mL/min — ABNORMAL LOW (ref >60)
GFR calc non Af Amer: 4 mL/min — ABNORMAL LOW (ref >60)
GLUCOSE: 125 mg/dL — AB (ref 65–99)
POTASSIUM: 3.7 mmol/L (ref 3.5–5.1)
Sodium: 129 mmol/L — ABNORMAL LOW (ref 135–145)

## 2015-12-12 NOTE — Clinical Documentation Improvement (Signed)
Nephrology/Renal  Can the diagnosis of anemia be further specified?   Acute Blood Loss Anemia  Anemia of Chronic disease, including the associated chronic disease state  Iron Deficiency Anemia  Other  Clinically Undetermined  Document any associated diagnoses/conditions. CKD III Hematuria 12/10/15: H/H= 11.1/32.9 12/11/15: H/H= 9.7/28.7   Please exercise your independent, professional judgment when responding. A specific answer is not anticipated or expected.   Thank You,  Rolm Gala, RN, [place] [phone]

## 2015-12-12 NOTE — Progress Notes (Signed)
[facility]  ROUNDING NOTE   Subjective:   UOP 3450: Nephrostomy 3350, foley 100  Creatinine 8.82 (8.97) BUN 60(63)   Bicarb gtt @ 50m/hr  Objective:  Vital signs in last 24 hours:  Temp:  [98.8 F (37.1 C)-99.3 F (37.4 C)] 99.3 F (37.4 C) (02/15 0510) Pulse Rate:  [69-78] 76 (02/15 1010) Resp:  [20] 20 (02/14 2027) BP: (122-135)/(50-69) 135/56 mmHg (02/15 1010) SpO2:  [98 %-100 %] 98 % (02/15 0510)  Weight change:  Filed Weights   12/10/15 1525  Weight: 73.846 kg (162 lb 12.8 oz)    Intake/Output: I/O last 3 completed shifts: In: 16644[P.O.:170; I.V.:853; Other:460] Out: 4750 [Urine:4750]   Intake/Output this shift:  Total I/O In: 80.2 [I.V.:80.2] Out: 650 [Urine:650]  Physical Exam: General: NAD, laying in bed  Head: Normocephalic, atraumatic. Moist oral mucosal membranes  Eyes: Anicteric, PERRL  Neck: Supple, trachea midline  Lungs:  Clear to auscultation  Heart: Regular rate and rhythm  Abdomen:  Left sided tenderness, Right nephrostomy tube  Extremities:  no edema.  Neurologic: Nonfocal, moving all four extremities  Skin: No lesions  GU: Right nephrostomy with pinkish urine, red urine in foley    Basic Metabolic Panel:  Recent Labs Lab 12/10/15 1116 12/10/15 1834 12/11/15 0125 12/11/15 0556 12/12/15 0510  NA 124* 124* 124* 125* 129*  K 4.3 4.9 4.9 4.3 3.7  CL 92* 91* 92* 86* 93*  CO2 15* 15* 17* 20* 24  GLUCOSE 71 101* 160* 160* 125*  BUN 62* 65* 65* 63* 60*  CREATININE 8.97* 9.29* 8.96* 8.97* 8.82*  CALCIUM 8.7* 8.7* 8.5* 8.2* 8.2*    Liver Function Tests: No results for input(s): AST, ALT, ALKPHOS, BILITOT, PROT, ALBUMIN in the last 168 hours. No results for input(s): LIPASE, AMYLASE in the last 168 hours. No results for input(s): AMMONIA in the last 168 hours.  CBC:  Recent Labs Lab 12/10/15 1120 12/11/15 0556 12/12/15 0510  WBC 6.1 9.6 10.1  HGB 11.1* 9.7* 9.8*  HCT 32.9* 28.7* 28.4*  MCV 86.9 85.9 84.5   PLT 231 242 223    Cardiac Enzymes: No results for input(s): CKTOTAL, CKMB, CKMBINDEX, TROPONINI in the last 168 hours.  BNP: Invalid input(s): POCBNP  CBG: No results for input(s): GLUCAP in the last 168 hours.  Microbiology: Results for orders placed or performed during the hospital encounter of 12/04/15  Urine culture     Status: None   Collection Time: 12/04/15  6:21 PM  Result Value Ref Range Status   Specimen Description URINE, RANDOM  Final   Special Requests NONE  Final   Culture MULTIPLE SPECIES PRESENT, SUGGEST RECOLLECTION  Final   Report Status 12/06/2015 FINAL  Final    Coagulation Studies:  Recent Labs  12/11/15 0556  LABPROT 16.6*  INR 1.33    Urinalysis: No results for input(s): COLORURINE, LABSPEC, PHURINE, GLUCOSEU, HGBUR, BILIRUBINUR, KETONESUR, PROTEINUR, UROBILINOGEN, NITRITE, LEUKOCYTESUR in the last 72 hours.  Invalid input(s): APPERANCEUR    Imaging: [place]raoperative  12/10/2015  CLINICAL DATA:  Ultrasound was provided for use by the ordering physician, and a technical charge was applied by the performing facility.  No radiologist interpretation/professional services rendered.   [place]al  12/12/2015  CLINICAL DATA:  Renal failure.  Right nephrostomy placed 12/10/2015 EXAM: RENAL / URINARY TRACT ULTRASOUND COMPLETE COMPARISON:  CT 10/24/2015 FINDINGS: Right Kidney: Length: 10.3 cm. Right nephrostomy catheter in place. No hydronephrosis. Small cyst in the lower pole measures up to 1.6 cm. Left  Kidney: Length: 11.4 cm.  Mild left hydronephrosis, new since prior CT. Bladder: Appears normal for degree of bladder distention. IMPRESSION: Resolution of the previously seen right hydronephrosis with right nephrostomy catheter in place. Mild left hydronephrosis.  This is new since prior CT. Electronically Signed   By: Rolm Baptise M.D.   On: 12/12/2015 09:39   Ir  Nephroureteral Cath Place Right  12/11/2015  INDICATION: Right hydronephrosis secondary to  ureteral obstruction. EXAM: IR NEPHROURETERAL CATH PLACEMENT RIGHT; ULTRAOUND INTRAOPERATIVE - NRPT [facility] COMPARISON:  CT scan of October 24, 2015. MEDICATIONS: 1 g Ancef IV; The antibiotic was administered in an appropriate time frame prior to skin puncture. ANESTHESIA/SEDATION: Fentanyl 50 mcg IV; Versed 2.0 mg IV Moderate Sedation Time:  15 minutes. The patient was continuously monitored during the procedure by the interventional radiology nurse under my direct supervision. CONTRAST:  7m OMNIPAQUE IOHEXOL 300 MG/ML SOLN - administered into the collecting system(s) FLUOROSCOPY TIME:  Fluoroscopy Time: 2 minutes 30 seconds (391.25 mGy). COMPLICATIONS: None immediate. PROCEDURE: Informed written consent was obtained from the patient after a thorough discussion of the procedural risks, benefits and alternatives. All questions were addressed. Maximal Sterile Barrier Technique was utilized including caps, mask, sterile gowns, sterile gloves, sterile drape, hand hygiene and skin antiseptic. A timeout was performed prior to the initiation of the procedure. Patient was placed prone on angiographic table. Right flank region was prepped and draped using sterile technique. Local anesthetic was applied. Under real-time ultrasound guidance, 21 gauge needle was directed into dilated right intrarenal collecting system. Contrast under fluoroscopy was injected for antegrade nephrostogram, which demonstrates severe right hydronephrosis. Guidewire was placed under fluoroscopic guidance, followed by 8 [place]ilator. Then, 8.5 French nephrostomy catheter was passed over the wire, and its internal loop was internally secured. Under fluoroscopy, contrast was injected to confirm its position within the right renal pelvis. Dilatation of the right ureter was noted to the level of the sacroiliac joint, where it appears to be occluded. The external portion of the catheter was secured and hooked up to external drainage. IMPRESSION: Under  ultrasound and fluoroscopic guidance, successful placement of 8.5 French nephrostomy catheter into right renal pelvis. This was hooked up to external drainage. Electronically Signed   By: JMarijo Conception M.D.   On: 12/11/2015 08:02     Medications:   .  sodium bicarbonate  infusion 1000 mL 50 mL/hr at 12/12/15 0456   . amLODipine  10 mg Oral Daily  . aspirin EC  81 mg Oral [id number]  . cholecalciferol  4,000 Units Oral Daily  . docusate sodium  200 mg Oral BID  . pneumococcal 23 valent vaccine  0.5 mL Intramuscular Tomorrow-1000  . Vitamin D3  50,000 Units Oral Weekly   acetaminophen, diphenhydrAMINE **OR** diphenhydrAMINE, lidocaine (PF), morphine injection, ondansetron, opium-belladonna, oxybutynin, oxyCODONE  Assessment/ Plan:  Ms. Tonya LEINis a 82y.o. black female  with hypertension, [facility] for gross hematuria,right hydronephrosis.  1. Acute renal failure on chronic kidney disease stage III: Baseline creatinine of 1.14 09/19/2015 with eGFR of 52.  Nonoliguric urine output. Seems to have obstructive uropathy leading to ATN. Urine output and electrolytes are improving. But creatinine has no really changed.  - no acute indication for dialysis at this time. Monitor urine output, volume status, electrolytes and renal function.  - Discussed case with Dr. BErlene Quan Left hydronephrosis noted on ultrasound.  - Hold quinapril.  - Discussed dialysis and plan of care with patient  and daughter.   2. 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   3. Hyponatremia: with recent cystoscopy. Improved Na 129.  - IV sodium bicarbonate infusion as above. Monitor volume status.   4. Anemia: hemoglobin 9.8. With gross hematuria.  - low threshold to start epo.   5. Hypertension: blood pressure at goal.  - continue amlodipine.       LOS: 1 Alianny Toelle,  Leighana Neyman 2/15/201710:37 AM

## 2015-12-12 NOTE — Progress Notes (Signed)
Urology Follow Up  Subjective: Creatinine remains quite elevated despite right percutaneous nephrostomy tube with excellent urine output. Minimal output from bladder (left kidney).  renal ultrasound shows progression of left-sided hydronephrosis. Internal medicine and nephrology following. Pathology still pending.  No complaints today. Feeling better overall. Tolerating diet.  Acidosis improving.  Anti-infectives: Anti-infectives    Start     Dose/Rate Route Frequency Ordered Stop   12/10/15 1545  ceFAZolin (ANCEF) IVPB 1 g/50 mL premix     1 g 100 mL/hr over 30 Minutes Intravenous  Once 12/10/15 1541 12/10/15 1657   12/10/15 1245  ceFAZolin (ANCEF) IVPB 1 g/50 mL premix  Status:  Discontinued     1 g 100 mL/hr over 30 Minutes Intravenous Every 8 hours 12/10/15 1232 12/10/15 1337   12/10/15 0611  ceFAZolin (ANCEF) 2-3 GM-% IVPB SOLR    Comments:  Rexanne Mano: cabinet override      12/10/15 0611 12/10/15 1814   12/10/15 0600  ceFAZolin (ANCEF) IVPB 2 g/50 mL premix     2 g 100 mL/hr over 30 Minutes Intravenous 30 min pre-op 12/10/15 0600 12/10/15 0730      Current Facility-Administered Medications  Medication Dose Route Frequency Provider Last Rate Last Dose  . acetaminophen (TYLENOL) tablet 650 mg  650 mg Oral Q4H PRN Hollice Espy, MD      . amLODipine (NORVASC) tablet 10 mg  10 mg Oral Daily Hollice Espy, MD   10 mg at 12/12/15 1014  . aspirin EC tablet 81 mg  81 mg Oral Maryclare Labrador, MD   81 mg at 12/12/15 0456  . cholecalciferol (VITAMIN D) tablet 4,000 Units  4,000 Units Oral Daily Hollice Espy, MD   4,000 Units at 12/12/15 1014  . diphenhydrAMINE (BENADRYL) injection 12.5 mg  12.5 mg Intravenous Q6H PRN Hollice Espy, MD       Or  . diphenhydrAMINE (BENADRYL) 12.5 MG/5ML elixir 12.5 mg  12.5 mg Oral Q6H PRN Hollice Espy, MD      . docusate sodium (COLACE) capsule 200 mg  200 mg Oral BID Hollice Espy, MD   200 mg at 12/12/15 1017  . lidocaine (PF)  (XYLOCAINE) 1 % injection    PRN Sabino Dick, MD   6 mL at 12/10/15 1707  . morphine 2 MG/ML injection 2-4 mg  2-4 mg Intravenous Q2H PRN Hollice Espy, MD      . ondansetron [facility]) injection 4 mg  4 mg Intravenous Q4H PRN Hollice Espy, MD   4 mg at 12/11/15 2215  . opium-belladonna (B&O SUPPRETTES) 16.2-60 MG suppository 1 suppository  1 suppository Rectal Q6H PRN Hollice Espy, MD      . oxybutynin (DITROPAN) tablet 5 mg  5 mg Oral Q8H PRN Hollice Espy, MD      . oxyCODONE (Oxy IR/ROXICODONE) immediate release tablet 5 mg  5 mg Oral Q4H PRN Hollice Espy, MD      . pneumococcal 23 valent vaccine (PNU-IMMUNE) injection 0.5 mL  0.5 mL Intramuscular Tomorrow-1000 Hollice Espy, MD   0.5 mL at 12/12/15 1000  . sodium bicarbonate 150 mEq in dextrose 5 % 1,000 mL infusion   Intravenous Continuous Lavonia Dana, MD 50 mL/hr at 12/12/15 0456    . Vitamin D3 CAPS 50,000 Units  50,000 Units Oral Weekly Hollice Espy, MD         Objective: Vital signs in last 24 hours: Temp:  [98.8 F (37.1 C)-99.3 F (37.4 C)] 99.3 F (37.4 C) (02/15 0510) Pulse Rate:  [69-78]  76 (02/15 1010) Resp:  [20] 20 (02/14 2027) BP: (122-135)/(50-69) 135/56 mmHg (02/15 1010) SpO2:  [98 %-100 %] 98 % (02/15 0510)  Intake/Output from previous day: 02/14 0701 - 02/15 0700 In: 1483 [P.O.:170; I.V.:853] Out: 3450 [Urine:3450]  Break down 3350 right nephrostomy, Foley 100  Intake/Output this shift: Total I/O In: 320.2 [P.O.:240; I.V.:80.2] Out: 800 [Urine:800]   Physical Exam  Constitutional: She is oriented to person, place, and time and well-developed, well-nourished, and in no distress.  Daughter at bedside this morning  HENT:  Head: Normocephalic and atraumatic.  Neck: Normal range of motion.  Cardiovascular: Normal rate.   Abdominal: Soft. Bowel sounds are normal.  Reducible abdominal wall hernia  Genitourinary:  Urethral Foley draining scant bloody urine. Right nephrostomy draining light pink  urine, and no clots.  Musculoskeletal: Normal range of motion.  Neurological: She is alert and oriented to person, place, and time.  Skin: Skin is warm and dry.  Psychiatric: Affect and judgment normal.    Lab Results:   Recent Labs  12/11/15 0556 12/12/15 0510  WBC 9.6 10.1  HGB 9.7* 9.8*  HCT 28.7* 28.4*  PLT 242 223   BMET  Recent Labs  12/11/15 0556 12/12/15 0510  NA 125* 129*  K 4.3 3.7  CL 86* 93*  CO2 20* 24  GLUCOSE 160* 125*  BUN 63* 60*  CREATININE 8.97* 8.82*  CALCIUM 8.2* 8.2*   PT/INR  Recent Labs  12/11/15 0556  LABPROT 16.6*  INR 1.33   Studies/Results: [place] Intraoperative  12/10/2015  CLINICAL DATA:  Ultrasound was provided for use by the ordering physician, and a technical charge was applied by the performing facility.  No radiologist interpretation/professional services rendered.   US Renal  12/12/2015  CLINICAL DATA:  Renal failure.  Right nephrostomy placed 12/10/2015 EXAM: RENAL / URINARY TRACT ULTRASOUND COMPLETE COMPARISON:  CT 10/24/2015 FINDINGS: Right Kidney: Length: 10.3 cm. Right nephrostomy catheter in place. No hydronephrosis. Small cyst in the lower pole measures up to 1.6 cm. Left Kidney: Length: 11.4 cm.  Mild left hydronephrosis, new since prior CT. Bladder: Appears normal for degree of bladder distention. IMPRESSION: Resolution of the previously seen right hydronephrosis with right nephrostomy catheter in place. Mild left hydronephrosis.  This is new since prior CT. Electronically Signed   By: Rolm Baptise M.D.   On: 12/12/2015 09:39   Ir  Nephroureteral Cath Place Right  12/11/2015  INDICATION: Right hydronephrosis secondary to ureteral obstruction. EXAM: IR NEPHROURETERAL CATH PLACEMENT RIGHT; ULTRAOUND INTRAOPERATIVE - NRPT [facility] COMPARISON:  CT scan of October 24, 2015. MEDICATIONS: 1 g Ancef IV; The antibiotic was administered in an appropriate time frame prior to skin puncture. ANESTHESIA/SEDATION: Fentanyl 50 mcg IV; Versed 2.0  mg IV Moderate Sedation Time:  15 minutes. The patient was continuously monitored during the procedure by the interventional radiology nurse under my direct supervision. CONTRAST:  75mL OMNIPAQUE IOHEXOL 300 MG/ML SOLN - administered into the collecting system(s) FLUOROSCOPY TIME:  Fluoroscopy Time: 2 minutes 30 seconds (391.25 mGy). COMPLICATIONS: None immediate. PROCEDURE: Informed written consent was obtained from the patient after a thorough discussion of the procedural risks, benefits and alternatives. All questions were addressed. Maximal Sterile Barrier Technique was utilized including caps, mask, sterile gowns, sterile gloves, sterile drape, hand hygiene and skin antiseptic. A timeout was performed prior to the initiation of the procedure. Patient was placed prone on angiographic table. Right flank region was prepped and draped using sterile technique. Local anesthetic was applied. Under real-time ultrasound guidance,  21 gauge needle was directed into dilated right intrarenal collecting system. Contrast under fluoroscopy was injected for antegrade nephrostogram, which demonstrates severe right hydronephrosis. Guidewire was placed under fluoroscopic guidance, followed by 8 [place] dilator. Then, 8.5 French nephrostomy catheter was passed over the wire, and its internal loop was internally secured. Under fluoroscopy, contrast was injected to confirm its position within the right renal pelvis. Dilatation of the right ureter was noted to the level of the sacroiliac joint, where it appears to be occluded. The external portion of the catheter was secured and hooked up to external drainage. IMPRESSION: Under ultrasound and fluoroscopic guidance, successful placement of 8.5 French nephrostomy catheter into right renal pelvis. This was hooked up to external drainage. Electronically Signed   By: Marijo Conception, M.D.   On: 12/11/2015 08:02    Assessment: POD 2 s/p Procedure(s): CYSTOSCOPY WITH RETROGRADE  PYELOGRAM TRANSURETHRAL RESECTION OF BLADDER TUMOR (TURBT) EXAM UNDER ANESTHESIA  1. Acute on chronic renal failure/ metabolic acidosis- acidosis improving with bicarb, Cr stable.  RUS with worsening L hydronephrosis.  Discussed case with Dr. Juleen China Dr.Green (IR) who agree with L perc tube placement tomorrow if Cr fails to improve dramatically overnight  2. Bladder mass- pathology pending  3. Anemia- dilution/ acute blood loss anemia/ chronic diease.  Stable.   4. Hyponatremia: Improving    Plan: F/u nephrology/ IM consult recs - continue NS +bicarb Trend BMP, UOP NPO at MN with plan for left nephrostomy unless Foley UOP/ Cr improves ovenright Awaiting pathology results Consider staging CT once pathology resulted   LOS: 1 day    Hollice Espy 12/12/2015

## 2015-12-12 NOTE — Progress Notes (Signed)
   12/12/15 1900  Clinical Encounter Type  Visited With Patient and family together  Visit Type Initial  Referral From Nurse  Consult/Referral To Chaplain  Chaplain provided HPOA education to patient and family. They will make a decision and call chaplain if they are wanting to complete.   [place] [phone]

## 2015-12-12 NOTE — Progress Notes (Signed)
[facility] NAME: Tonya Moreno    MR#:  [id number]  DATE OF BIRTH:  01/30/1934  SUBJECTIVE:  CHIEF COMPLAINT:  No chief complaint on file.  Hematuria from Foley cath. No complaint. REVIEW OF SYSTEMS:  CONSTITUTIONAL: No fever, fatigue or weakness.  EYES: No blurred or double vision.  EARS, NOSE, AND THROAT: No tinnitus or ear pain.  RESPIRATORY: No cough, shortness of breath, wheezing or hemoptysis.  CARDIOVASCULAR: No chest pain, orthopnea, edema.  GASTROINTESTINAL: No nausea, vomiting, diarrhea or abdominal pain.  GENITOURINARY: No dysuria, hematuria.  ENDOCRINE: No polyuria, nocturia, but has hematuria. HEMATOLOGY: No anemia, easy bruising or bleeding SKIN: No rash or lesion. MUSCULOSKELETAL: No joint pain or arthritis.   NEUROLOGIC: No tingling, numbness, weakness.  PSYCHIATRY: No anxiety or depression.   DRUG ALLERGIES:   Allergies  Allergen Reactions  . Hydralazine Palpitations    VITALS:  Blood pressure 107/60, pulse 72, temperature 97.7 F (36.5 C), temperature source Oral, resp. rate 18, height 5\' 5"  (1.651 m), weight 73.846 kg (162 lb 12.8 oz), SpO2 100 %.  PHYSICAL EXAMINATION:  GENERAL:  82 y.o.-year-old patient lying in the bed with no acute distress.  EYES: Pupils equal, round, reactive to light and accommodation. No scleral icterus. Extraocular muscles intact.  HEENT: Head atraumatic, normocephalic. Oropharynx and nasopharynx clear.  NECK:  Supple, no jugular venous distention. No thyroid enlargement, no tenderness.  LUNGS: Normal breath sounds bilaterally, no wheezing, rales,rhonchi or crepitation. No use of accessory muscles of respiration.  CARDIOVASCULAR: S1, S2 normal. No murmurs, rubs, or gallops.  ABDOMEN: Soft, nontender, nondistended. Bowel sounds present. No organomegaly or mass.  EXTREMITIES: No pedal edema, cyanosis, or clubbing.  NEUROLOGIC: Cranial nerves II through XII are  intact. Muscle strength 5/5 in all extremities. Sensation intact. Gait not checked.  PSYCHIATRIC: The patient is alert and oriented x 3.  SKIN: No obvious rash, lesion, or ulcer.    LABORATORY PANEL:   CBC  Recent Labs Lab 12/12/15 0510  WBC 10.1  HGB 9.8*  HCT 28.4*  PLT 223   ------------------------------------------------------------------------------------------------------------------  Chemistries   Recent Labs Lab 12/12/15 0510  NA 129*  K 3.7  CL 93*  CO2 24  GLUCOSE 125*  BUN 60*  CREATININE 8.82*  CALCIUM 8.2*   ------------------------------------------------------------------------------------------------------------------  Cardiac Enzymes No results for input(s): TROPONINI in the last 168 hours. ------------------------------------------------------------------------------------------------------------------  RADIOLOGY:  [place] Intraoperative  12/10/2015  CLINICAL DATA:  Ultrasound was provided for use by the ordering physician, and a technical charge was applied by the performing facility.  No radiologist interpretation/professional services rendered.   US Renal  12/12/2015  CLINICAL DATA:  Renal failure.  Right nephrostomy placed 12/10/2015 EXAM: RENAL / URINARY TRACT ULTRASOUND COMPLETE COMPARISON:  CT 10/24/2015 FINDINGS: Right Kidney: Length: 10.3 cm. Right nephrostomy catheter in place. No hydronephrosis. Small cyst in the lower pole measures up to 1.6 cm. Left Kidney: Length: 11.4 cm.  Mild left hydronephrosis, new since prior CT. Bladder: Appears normal for degree of bladder distention. IMPRESSION: Resolution of the previously seen right hydronephrosis with right nephrostomy catheter in place. Mild left hydronephrosis.  This is new since prior CT. Electronically Signed   By: Rolm Baptise M.D.   On: 12/12/2015 09:39   Ir  Nephroureteral Cath Place Right  12/11/2015  INDICATION: Right hydronephrosis secondary to ureteral obstruction. EXAM: IR  NEPHROURETERAL CATH PLACEMENT RIGHT; ULTRAOUND INTRAOPERATIVE - NRPT [facility] COMPARISON:  CT scan of October 24, 2015. MEDICATIONS: 1  g Ancef IV; The antibiotic was administered in an appropriate time frame prior to skin puncture. ANESTHESIA/SEDATION: Fentanyl 50 mcg IV; Versed 2.0 mg IV Moderate Sedation Time:  15 minutes. The patient was continuously monitored during the procedure by the interventional radiology nurse under my direct supervision. CONTRAST:  60mL OMNIPAQUE IOHEXOL 300 MG/ML SOLN - administered into the collecting system(s) FLUOROSCOPY TIME:  Fluoroscopy Time: 2 minutes 30 seconds (391.25 mGy). COMPLICATIONS: None immediate. PROCEDURE: Informed written consent was obtained from the patient after a thorough discussion of the procedural risks, benefits and alternatives. All questions were addressed. Maximal Sterile Barrier Technique was utilized including caps, mask, sterile gowns, sterile gloves, sterile drape, hand hygiene and skin antiseptic. A timeout was performed prior to the initiation of the procedure. Patient was placed prone on angiographic table. Right flank region was prepped and draped using sterile technique. Local anesthetic was applied. Under real-time ultrasound guidance, 21 gauge needle was directed into dilated right intrarenal collecting system. Contrast under fluoroscopy was injected for antegrade nephrostogram, which demonstrates severe right hydronephrosis. Guidewire was placed under fluoroscopic guidance, followed by 8 [place] dilator. Then, 8.5 French nephrostomy catheter was passed over the wire, and its internal loop was internally secured. Under fluoroscopy, contrast was injected to confirm its position within the right renal pelvis. Dilatation of the right ureter was noted to the level of the sacroiliac joint, where it appears to be occluded. The external portion of the catheter was secured and hooked up to external drainage. IMPRESSION: Under ultrasound and fluoroscopic  guidance, successful placement of 8.5 French nephrostomy catheter into right renal pelvis. This was hooked up to external drainage. Electronically Signed   By: Marijo Conception, M.D.   On: 12/11/2015 08:02    EKG:   Orders placed or performed during the hospital encounter of 11/01/15  . EKG 12-Lead  . EKG 12-Lead    ASSESSMENT AND PLAN:   1. Acute renal failure on chronic kidney disease stage III, Seems to have obstructive uropathy leading to ATN.  Hold quinapril. no acute indication for dialysis at this time per Dr. Juleen China. F/u BMP.  * Acute renal failure with gross hematuria post obstructive etiology with bladder tumor and right-sided hydronephrosis and left-sided hydronephrosis Status post tumor resection and nephrostomy tube placement by Dr. Erlene Quan. NPO at [facility] with plan for left nephrostomy unless Foley UOP/ Cr improves overnight per Dr. Erlene Quan.  hold ASA.  2. Metabolic acidosis. Has been treated with bicarb iv  3. Hyponatremia. Improving, continue Bicarb iv, f/u BMP.  4. Essential hypertension. Hold quinapril and continue amlodipine.   All the records are reviewed and case discussed with Care Management/Social Workerr. Management plans discussed with the patient, her daughter and they are in agreement.  CODE STATUS:   TOTAL TIME TAKING CARE OF THIS PATIENT: 42 minutes.  Greater than 50% time was spent on coordination of care and face-to-face counseling.  POSSIBLE D/C IN 3 DAYS, DEPENDING ON CLINICAL CONDITION.   Demetrios Loll M.D on 12/12/2015 at 1:56 PM  Between 7am to 6pm - Pager - [phone]  After 6pm go to [email or web address] - password [email or web address] [facility]  Office  [phone]  CC: Primary care physician; Letta Median, MD

## 2015-12-13 ENCOUNTER — Inpatient Hospital Stay: Payer: Medicare Other

## 2015-12-13 DIAGNOSIS — N3289 Other specified disorders of bladder: Secondary | ICD-10-CM

## 2015-12-13 DIAGNOSIS — N1330 Unspecified hydronephrosis: Secondary | ICD-10-CM

## 2015-12-13 LAB — CBC
HCT: 27.2 % — ABNORMAL LOW (ref 35.0–47.0)
Hemoglobin: 9.4 g/dL — ABNORMAL LOW (ref 12.0–16.0)
MCH: 29.6 pg (ref 26.0–34.0)
MCHC: 34.5 g/dL (ref 32.0–36.0)
MCV: 86 fL (ref 80.0–100.0)
Platelets: 188 10*3/uL (ref 150–440)
RBC: 3.16 MIL/uL — ABNORMAL LOW (ref 3.80–5.20)
RDW: 14.4 % (ref 11.5–14.5)
WBC: 7.9 10*3/uL (ref 3.6–11.0)

## 2015-12-13 LAB — BASIC METABOLIC PANEL
Anion gap: 11 (ref 5–15)
BUN: 60 mg/dL — AB (ref 6–20)
CALCIUM: 8.3 mg/dL — AB (ref 8.9–10.3)
CO2: 30 mmol/L (ref 22–32)
Chloride: 93 mmol/L — ABNORMAL LOW (ref 101–111)
Creatinine, Ser: 8.53 mg/dL — ABNORMAL HIGH (ref 0.44–1.00)
GFR calc Af Amer: 5 mL/min — ABNORMAL LOW (ref >60)
GFR, EST NON AFRICAN AMERICAN: 4 mL/min — AB (ref >60)
GLUCOSE: 106 mg/dL — AB (ref 65–99)
Potassium: 3.2 mmol/L — ABNORMAL LOW (ref 3.5–5.1)
Sodium: 134 mmol/L — ABNORMAL LOW (ref 135–145)

## 2015-12-13 LAB — MAGNESIUM: Magnesium: 1.9 mg/dL (ref 1.7–2.4)

## 2015-12-13 LAB — SURGICAL PCR SCREEN
MRSA, PCR: NEGATIVE
Staphylococcus aureus: NEGATIVE

## 2015-12-13 MED ORDER — SODIUM CHLORIDE 0.9 % IV SOLN
INTRAVENOUS | Status: DC
  Administered 2015-12-13 (×2): via INTRAVENOUS

## 2015-12-13 MED ORDER — MIDAZOLAM HCL 5 MG/5ML IJ SOLN
INTRAMUSCULAR | Status: AC | PRN
  Administered 2015-12-13: 1 mg via INTRAVENOUS
  Administered 2015-12-13: 0.5 mg via INTRAVENOUS
  Administered 2015-12-13: 1 mg via INTRAVENOUS

## 2015-12-13 MED ORDER — CIPROFLOXACIN IN D5W 400 MG/200ML IV SOLN
400.0000 mg | Freq: Once | INTRAVENOUS | Status: AC
  Administered 2015-12-13: 400 mg via INTRAVENOUS
  Filled 2015-12-13: qty 200

## 2015-12-13 MED ORDER — LIDOCAINE HCL (PF) 1 % IJ SOLN
INTRAMUSCULAR | Status: DC | PRN
  Administered 2015-12-13: 3 mL

## 2015-12-13 MED ORDER — FENTANYL CITRATE (PF) 100 MCG/2ML IJ SOLN
INTRAMUSCULAR | Status: AC | PRN
  Administered 2015-12-13: 50 ug via INTRAVENOUS

## 2015-12-13 MED ORDER — POTASSIUM CHLORIDE CRYS ER 20 MEQ PO TBCR
40.0000 meq | EXTENDED_RELEASE_TABLET | Freq: Once | ORAL | Status: AC
  Administered 2015-12-13: 40 meq via ORAL
  Filled 2015-12-13: qty 2

## 2015-12-13 MED ORDER — LIDOCAINE HCL (PF) 1 % IJ SOLN
INTRAMUSCULAR | Status: AC
  Filled 2015-12-13: qty 15

## 2015-12-13 MED ORDER — IOHEXOL 300 MG/ML  SOLN
30.0000 mL | Freq: Once | INTRAMUSCULAR | Status: AC | PRN
  Administered 2015-12-13: 20 mL

## 2015-12-13 MED ORDER — MIDAZOLAM HCL 5 MG/5ML IJ SOLN
INTRAMUSCULAR | Status: AC
  Filled 2015-12-13: qty 10

## 2015-12-13 MED ORDER — FENTANYL CITRATE (PF) 100 MCG/2ML IJ SOLN
INTRAMUSCULAR | Status: AC
  Filled 2015-12-13: qty 4

## 2015-12-13 MED ORDER — CIPROFLOXACIN IN D5W 400 MG/200ML IV SOLN
INTRAVENOUS | Status: AC
  Filled 2015-12-13: qty 200

## 2015-12-13 MED ORDER — FENTANYL CITRATE (PF) 100 MCG/2ML IJ SOLN
INTRAMUSCULAR | Status: DC | PRN
  Administered 2015-12-13: 25 ug via INTRAVENOUS

## 2015-12-13 NOTE — Procedures (Signed)
Interventional Radiology Procedure Note  Procedure:  Left nephrostomy tube placement; right nephrostomy exchange  Complications:  None  Estimated Blood Loss: < 10 mL  10 Fr PCN placed on left and formed in renal pelvis.  Draining blood-tinged urine after placement.  Previously placed right 8 Fr PCN visibly retracted at skin exit site.  Fluoro and tube injection shows tube in periphery of collecting system.  Tube exchanged for 10 Fr PCN and formed in central collecting system.   Tonya Moreno. Kathlene Cote, M.D Pager:  [phone]

## 2015-12-13 NOTE — Progress Notes (Signed)
Urology Follow Up  Subjective: Creatinine remains quite elevated despite right percutaneous nephrostomy tube with excellent urine output. Minimal output from bladder (left kidney).  renal ultrasound shows progression of left-sided hydronephrosis. Internal medicine and nephrology following. Pathology still pending.  No complaints today. Patient denies any pain. She reports just feeling "tired". Tolerating diet.  Acidosis improving.  Left percutaneous nephrostomy tube placement scheduled today.  Anti-infectives: Anti-infectives    Start     Dose/Rate Route Frequency Ordered Stop   12/10/15 1545  ceFAZolin (ANCEF) IVPB 1 g/50 mL premix     1 g 100 mL/hr over 30 Minutes Intravenous  Once 12/10/15 1541 12/10/15 1657   12/10/15 1245  ceFAZolin (ANCEF) IVPB 1 g/50 mL premix  Status:  Discontinued     1 g 100 mL/hr over 30 Minutes Intravenous Every 8 hours 12/10/15 1232 12/10/15 1337   12/10/15 0611  ceFAZolin (ANCEF) 2-3 GM-% IVPB SOLR    Comments:  Rexanne Mano: cabinet override      12/10/15 0611 12/10/15 1814   12/10/15 0600  ceFAZolin (ANCEF) IVPB 2 g/50 mL premix     2 g 100 mL/hr over 30 Minutes Intravenous 30 min pre-op 12/10/15 0600 12/10/15 0730      Current Facility-Administered Medications  Medication Dose Route Frequency Provider Last Rate Last Dose  . acetaminophen (TYLENOL) tablet 650 mg  650 mg Oral Q4H PRN Hollice Espy, MD      . amLODipine (NORVASC) tablet 10 mg  10 mg Oral Daily Hollice Espy, MD   10 mg at 12/12/15 1014  . cholecalciferol (VITAMIN D) tablet 4,000 Units  4,000 Units Oral Daily Hollice Espy, MD   4,000 Units at 12/12/15 1014  . diphenhydrAMINE (BENADRYL) injection 12.5 mg  12.5 mg Intravenous Q6H PRN Hollice Espy, MD       Or  . diphenhydrAMINE (BENADRYL) 12.5 MG/5ML elixir 12.5 mg  12.5 mg Oral Q6H PRN Hollice Espy, MD      . docusate sodium (COLACE) capsule 200 mg  200 mg Oral BID Hollice Espy, MD   200 mg at 12/12/15 1017  . lidocaine (PF)  (XYLOCAINE) 1 % injection    PRN Sabino Dick, MD   6 mL at 12/10/15 1707  . morphine 2 MG/ML injection 2-4 mg  2-4 mg Intravenous Q2H PRN Hollice Espy, MD      . ondansetron [facility]) injection 4 mg  4 mg Intravenous Q4H PRN Hollice Espy, MD   4 mg at 12/11/15 2215  . opium-belladonna (B&O SUPPRETTES) 16.2-60 MG suppository 1 suppository  1 suppository Rectal Q6H PRN Hollice Espy, MD      . oxybutynin (DITROPAN) tablet 5 mg  5 mg Oral Q8H PRN Hollice Espy, MD      . oxyCODONE (Oxy IR/ROXICODONE) immediate release tablet 5 mg  5 mg Oral Q4H PRN Hollice Espy, MD      . pneumococcal 23 valent vaccine (PNU-IMMUNE) injection 0.5 mL  0.5 mL Intramuscular Tomorrow-1000 Hollice Espy, MD   0.5 mL at 12/12/15 1000  . sodium bicarbonate 150 mEq in dextrose 5 % 1,000 mL infusion   Intravenous Continuous Lavonia Dana, MD 50 mL/hr at 12/13/15 0220    . Vitamin D3 CAPS 50,000 Units  50,000 Units Oral Weekly Hollice Espy, MD         Objective: Vital signs in last 24 hours: Temp:  [97.7 F (36.5 C)-99.2 F (37.3 C)] 99.2 F (37.3 C) (02/16 0623) Pulse Rate:  [71-78] 78 (02/16 0623) Resp:  [18-20] 18 (02/16  [id number]) BP: (107-135)/(51-61) 131/61 mmHg (02/16 0623) SpO2:  [96 %-100 %] 96 % (02/16 0623)  Intake/Output from previous day: 02/15 0701 - 02/16 0700 In: 1501.9 [P.O.:720; I.V.:781.9] Out: 2275 [Urine:2275]  Break down 2,236mL right nephrostomy, Foley 52mL  Intake/Output this shift: Total I/O In: 243 [I.V.:243] Out: -    Physical Exam  Constitutional: She is oriented to person, place, and time and well-developed, well-nourished, and in no distress.  Daughter at bedside this morning  HENT:  Head: Normocephalic and atraumatic.  Neck: Normal range of motion.  Cardiovascular: Normal rate.   Abdominal: Soft. Bowel sounds are normal.  Reducible abdominal wall hernia  Genitourinary:  Urethral Foley draining scant bloody urine. Right nephrostomy draining red urine, and no clots.   Musculoskeletal: Normal range of motion.  Neurological: She is alert and oriented to person, place, and time.  Skin: Skin is warm and dry.  Psychiatric: Affect and judgment normal.    Lab Results:   Recent Labs  12/12/15 0510 12/13/15 0506  WBC 10.1 7.9  HGB 9.8* 9.4*  HCT 28.4* 27.2*  PLT 223 188   BMET  Recent Labs  12/12/15 0510 12/13/15 0506  NA 129* 134*  K 3.7 3.2*  CL 93* 93*  CO2 24 30  GLUCOSE 125* 106*  BUN 60* 60*  CREATININE 8.82* 8.53*  CALCIUM 8.2* 8.3*   PT/INR  Recent Labs  12/11/15 0556  LABPROT 16.6*  INR 1.33   Studies/Results: US Renal  12/12/2015  CLINICAL DATA:  Renal failure.  Right nephrostomy placed 12/10/2015 EXAM: RENAL / URINARY TRACT ULTRASOUND COMPLETE COMPARISON:  CT 10/24/2015 FINDINGS: Right Kidney: Length: 10.3 cm. Right nephrostomy catheter in place. No hydronephrosis. Small cyst in the lower pole measures up to 1.6 cm. Left Kidney: Length: 11.4 cm.  Mild left hydronephrosis, new since prior CT. Bladder: Appears normal for degree of bladder distention. IMPRESSION: Resolution of the previously seen right hydronephrosis with right nephrostomy catheter in place. Mild left hydronephrosis.  This is new since prior CT. Electronically Signed   By: Rolm Baptise M.D.   On: 12/12/2015 09:39    Assessment: POD 2 s/p Procedure(s): CYSTOSCOPY WITH RETROGRADE PYELOGRAM TRANSURETHRAL RESECTION OF BLADDER TUMOR (TURBT) EXAM UNDER ANESTHESIA  1. Acute on chronic renal failure/ metabolic acidosis- acidosis improving with bicarb, Cr stable.  RUS with worsening L hydronephrosis. No output from Foley overnight. Discussed case with Dr. Juleen China Dr.Green (IR) who agree with L perc tube placement today  2. Bladder mass- pathology pending  3. Anemia- dilution/ acute blood loss anemia/ chronic diease.  Stable.   4. Hyponatremia: Improving    Plan: F/u nephrology/ IM consult recs - continue NS +bicarb Trend BMP, UOP NPO at [facility] with plan for left  nephrostomy today.  Awaiting pathology results Consider staging CT once pathology resulted   LOS: 2 days    Herbert Moors 12/13/2015

## 2015-12-13 NOTE — Sedation Documentation (Signed)
10.2 Fr multipurpose drainage catheter inserted withput difficulty.

## 2015-12-13 NOTE — Progress Notes (Signed)
[facility]  ROUNDING NOTE   Subjective:   Family at bedside. Patient without complaints. Given PO potassium for hypokalemia this morning.   UOP 2275 (3450): Nephrostomy 2275, foley 0  Creatinine 8.53 (8.82) (8.97) BUN 60  Bicarb gtt @ 40m/hr  Objective:  Vital signs in last 24 hours:  Temp:  [97.7 F (36.5 C)-99.2 F (37.3 C)] 99.1 F (37.3 C) (02/16 0915) Pulse Rate:  [71-78] 73 (02/16 0915) Resp:  [18-20] 18 (02/16 0915) BP: (107-135)/(51-61) 126/58 mmHg (02/16 0923) SpO2:  [96 %-100 %] 97 % (02/16 0915)  Weight change:  Filed Weights   12/10/15 1525  Weight: 73.846 kg (162 lb 12.8 oz)    Intake/Output: I/O last 3 completed shifts: In: 2854.9 [P.O.:770; I.V.:1634.9; Other:450] Out: 3975 [Urine:3975]   Intake/Output this shift:  Total I/O In: 243 [I.V.:243] Out: 630 [Urine:630]  Physical Exam: General: NAD, laying in bed  Head: Normocephalic, atraumatic. Moist oral mucosal membranes  Eyes: Anicteric, PERRL  Neck: Supple, trachea midline  Lungs:  Clear to auscultation  Heart: Regular rate and rhythm  Abdomen:  Right nephrostomy tube  Extremities:  no edema.  Neurologic: Nonfocal, moving all four extremities  Skin: No lesions  GU: Right nephrostomy with pinkish urine, red urine in foley    Basic Metabolic Panel:  Recent Labs Lab 12/10/15 1834 12/11/15 0125 12/11/15 0556 12/12/15 0510 12/13/15 0506  NA 124* 124* 125* 129* 134*  K 4.9 4.9 4.3 3.7 3.2*  CL 91* 92* 86* 93* 93*  CO2 15* 17* 20* 24 30  GLUCOSE 101* 160* 160* 125* 106*  BUN 65* 65* 63* 60* 60*  CREATININE 9.29* 8.96* 8.97* 8.82* 8.53*  CALCIUM 8.7* 8.5* 8.2* 8.2* 8.3*    Liver Function Tests: No results for input(s): AST, ALT, ALKPHOS, BILITOT, PROT, ALBUMIN in the last 168 hours. No results for input(s): LIPASE, AMYLASE in the last 168 hours. No results for input(s): AMMONIA in the last 168 hours.  CBC:  Recent Labs Lab 12/10/15 1120 12/11/15 0556  12/12/15 0510 12/13/15 0506  WBC 6.1 9.6 10.1 7.9  HGB 11.1* 9.7* 9.8* 9.4*  HCT 32.9* 28.7* 28.4* 27.2*  MCV 86.9 85.9 84.5 86.0  PLT 231 242 223 188    Cardiac Enzymes: No results for input(s): CKTOTAL, CKMB, CKMBINDEX, TROPONINI in the last 168 hours.  BNP: Invalid input(s): POCBNP  CBG: No results for input(s): GLUCAP in the last 168 hours.  Microbiology: Results for orders placed or performed during the hospital encounter of 12/10/15  Surgical PCR screen     Status: None   Collection Time: 12/12/15  5:45 AM  Result Value Ref Range Status   MRSA, PCR NEGATIVE NEGATIVE Final   Staphylococcus aureus NEGATIVE NEGATIVE Final    Comment:        The Xpert SA Assay (FDA approved for NASAL specimens in patients over 261years of age), is one component of a comprehensive surveillance program.  Test performance has been validated by [facility] patients greater than or equal to 126year old. It is not intended to diagnose infection nor to guide or monitor treatment.     Coagulation Studies:  Recent Labs  12/11/15 0556  LABPROT 16.6*  INR 1.33    Urinalysis: No results for input(s): COLORURINE, LABSPEC, PHURINE, GLUCOSEU, HGBUR, BILIRUBINUR, KETONESUR, PROTEINUR, UROBILINOGEN, NITRITE, LEUKOCYTESUR in the last 72 hours.  Invalid input(s): APPERANCEUR    Imaging: [place]  12/12/2015  CLINICAL DATA:  Renal failure.  Right nephrostomy placed 12/10/2015 EXAM:  RENAL / URINARY TRACT ULTRASOUND COMPLETE COMPARISON:  CT 10/24/2015 FINDINGS: Right Kidney: Length: 10.3 cm. Right nephrostomy catheter in place. No hydronephrosis. Small cyst in the lower pole measures up to 1.6 cm. Left Kidney: Length: 11.4 cm.  Mild left hydronephrosis, new since prior CT. Bladder: Appears normal for degree of bladder distention. IMPRESSION: Resolution of the previously seen right hydronephrosis with right nephrostomy catheter in place. Mild left hydronephrosis.  This is new since prior CT.  Electronically Signed   By: Rolm Baptise M.D.   On: 12/12/2015 09:39     Medications:   .  sodium bicarbonate  infusion 1000 mL 50 mL/hr at 12/13/15 0220   . amLODipine  10 mg Oral Daily  . cholecalciferol  4,000 Units Oral Daily  . docusate sodium  200 mg Oral BID  . pneumococcal 23 valent vaccine  0.5 mL Intramuscular Tomorrow-1000  . Vitamin D3  50,000 Units Oral Weekly   acetaminophen, diphenhydrAMINE **OR** diphenhydrAMINE, lidocaine (PF), morphine injection, ondansetron, opium-belladonna, oxybutynin, oxyCODONE  Assessment/ Plan:  [name] is a 82 y.o. black female  with hypertension, who was admitted to [facility] on 12/10/2015 for gross hematuria,right hydronephrosis.  1. Acute renal failure on chronic kidney disease stage III: Baseline creatinine of 1.14 09/19/2015 with eGFR of 52.  Nonoliguric urine output. Seems to have obstructive uropathy leading to ATN. Urine output and electrolytes are improving. But creatinine has no really changed but has trended downward - no acute indication for dialysis at this time. Monitor urine output, volume status, electrolytes and renal function.  - Discussed case with Dr. Erlene Quan. Left hydronephrosis noted on ultrasound.  - Hold quinapril.  - Discussed dialysis and plan of care with patient and family  2. 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 Will transition to IV NS at 50.   3. Hyponatremia: with recent cystoscopy. Improved Na 134.  - IV normal saline  4. Anemia: hemoglobin 9.4. With gross hematuria.  - low threshold to start epo.   5. Hypertension: blood pressure at goal.  - continue amlodipine.       LOS: [place], [place] 2/16/20179:36 AM

## 2015-12-13 NOTE — Sedation Documentation (Signed)
Cipro charted as started close to 1100, however, case delayed-was not started until 1345.

## 2015-12-13 NOTE — Progress Notes (Signed)
Per MD, Foley will be left in place and reassessed tomorrow AM.

## 2015-12-13 NOTE — Sedation Documentation (Signed)
Right nephrostomy tube exchange also don-from a 8.5 Fr to a 10.2 Fr multipurpose drain. (the right tube was starting to pull out.)

## 2015-12-13 NOTE — Care Management Important Message (Signed)
Important Message  Patient Details  Name: Tonya Moreno MRN: [id number] Date of Birth: Jun 03, 1934   Medicare Important Message Given:  Yes    [name] [name] 12/13/2015, 2:24 PM

## 2015-12-13 NOTE — Progress Notes (Signed)
[place] [facility] [place] NAME: Tonya Moreno    MR#:  [id number]  DATE OF BIRTH:  02-28-1934  SUBJECTIVE:  CHIEF COMPLAINT:  No chief complaint on file.  Hematuria from Foley cath. No complaint. S/p nephrostomy just now. REVIEW OF SYSTEMS:  CONSTITUTIONAL: No fever, fatigue or weakness.  EYES: No blurred or double vision.  EARS, NOSE, AND THROAT: No tinnitus or ear pain.  RESPIRATORY: No cough, shortness of breath, wheezing or hemoptysis.  CARDIOVASCULAR: No chest pain, orthopnea, edema.  GASTROINTESTINAL: No nausea, vomiting, diarrhea or abdominal pain.  GENITOURINARY: No dysuria, hematuria.  ENDOCRINE: No polyuria, nocturia, but has hematuria. HEMATOLOGY: No anemia, easy bruising or bleeding SKIN: No rash or lesion. MUSCULOSKELETAL: No joint pain or arthritis.   NEUROLOGIC: No tingling, numbness, weakness.  PSYCHIATRY: No anxiety or depression.   DRUG ALLERGIES:   Allergies  Allergen Reactions  . Hydralazine Palpitations    VITALS:  Blood pressure 117/55, pulse 69, temperature 98.6 F (37 C), temperature source Oral, resp. rate 14, height 5\' 5"  (1.651 m), weight 73.846 kg (162 lb 12.8 oz), SpO2 97 %.  PHYSICAL EXAMINATION:  GENERAL:  82 y.o.-year-old patient lying in the bed with no acute distress.  EYES: Pupils equal, round, reactive to light and accommodation. No scleral icterus. Extraocular muscles intact.  HEENT: Head atraumatic, normocephalic. Oropharynx and nasopharynx clear.  NECK:  Supple, no jugular venous distention. No thyroid enlargement, no tenderness.  LUNGS: Normal breath sounds bilaterally, no wheezing, rales,rhonchi or crepitation. No use of accessory muscles of respiration.  CARDIOVASCULAR: S1, S2 normal. No murmurs, rubs, or gallops.  ABDOMEN: Soft, nontender, nondistended. Bowel sounds present. No organomegaly or mass.  EXTREMITIES: No pedal edema, cyanosis, or clubbing.  NEUROLOGIC: Cranial nerves  II through XII are intact. Muscle strength 5/5 in all extremities. Sensation intact. Gait not checked.  PSYCHIATRIC: The patient is alert and oriented x 3.  SKIN: No obvious rash, lesion, or ulcer.    LABORATORY PANEL:   CBC  Recent Labs Lab 12/13/15 0506  WBC 7.9  HGB 9.4*  HCT 27.2*  PLT 188   ------------------------------------------------------------------------------------------------------------------  Chemistries   Recent Labs Lab 12/13/15 0506  NA 134*  K 3.2*  CL 93*  CO2 30  GLUCOSE 106*  BUN 60*  CREATININE 8.53*  CALCIUM 8.3*  MG 1.9   ------------------------------------------------------------------------------------------------------------------  Cardiac Enzymes No results for input(s): TROPONINI in the last 168 hours. ------------------------------------------------------------------------------------------------------------------  RADIOLOGY:  [place] Intraoperative  12/13/2015  CLINICAL DATA:  Ultrasound was provided for use by the ordering physician, and a technical charge was applied by the performing facility.  No radiologist interpretation/professional services rendered.   US Renal  12/12/2015  CLINICAL DATA:  Renal failure.  Right nephrostomy placed 12/10/2015 EXAM: RENAL / URINARY TRACT ULTRASOUND COMPLETE COMPARISON:  CT 10/24/2015 FINDINGS: Right Kidney: Length: 10.3 cm. Right nephrostomy catheter in place. No hydronephrosis. Small cyst in the lower pole measures up to 1.6 cm. Left Kidney: Length: 11.4 cm.  Mild left hydronephrosis, new since prior CT. Bladder: Appears normal for degree of bladder distention. IMPRESSION: Resolution of the previously seen right hydronephrosis with right nephrostomy catheter in place. Mild left hydronephrosis.  This is new since prior CT. Electronically Signed   By: Rolm Baptise M.D.   On: 12/12/2015 09:39    EKG:   Orders placed or performed during the hospital encounter of 11/01/15  . EKG 12-Lead  . EKG 12-Lead     ASSESSMENT AND PLAN:  1. Acute renal failure on chronic kidney disease stage III, Seems to have obstructive uropathy leading to ATN.  Hold quinapril. no acute indication for dialysis at this time per Dr. Juleen China. F/u BMP.  * Acute renal failure with gross hematuria post obstructive etiology with bladder tumor and right-sided hydronephrosis and left-sided hydronephrosis Status post tumor resection and nephrostomy tube placement by Dr. Erlene Quan. S/p Left nephrostomy tube placement; right nephrostomy exchange by radiologist.  hold ASA.  2. Metabolic acidosis. Improved with bicarb iv, transition to NS iv.  3. Hyponatremia. Improving, NS iv, f/u BMP.  4. Essential hypertension. Hold quinapril and hold amlodipine due to low side BP.   5. Anemia of chronic disease. Stable.  * hypokalemia. KCl 40 mEq one dose. F/u BMP.   All the records are reviewed and case discussed with Care Management/Social Workerr. Management plans discussed with the patient, her daughter and they are in agreement.  CODE STATUS: full code.  TOTAL TIME TAKING CARE OF THIS PATIENT: 42 minutes.  Greater than 50% time was spent on coordination of care and face-to-face counseling.  POSSIBLE D/C IN 3 DAYS, DEPENDING ON CLINICAL CONDITION.   Demetrios Loll M.D on 12/13/2015 at 4:57 PM  Between 7am to 6pm - Pager - [phone]  After 6pm go to [email or web address] - password [email or web address] [facility]  Office  [phone]  CC: Primary care physician; Letta Median, MD

## 2015-12-14 ENCOUNTER — Encounter: Payer: Self-pay | Admitting: Urology

## 2015-12-14 ENCOUNTER — Other Ambulatory Visit: Payer: Medicare Other

## 2015-12-14 DIAGNOSIS — N132 Hydronephrosis with renal and ureteral calculous obstruction: Secondary | ICD-10-CM

## 2015-12-14 DIAGNOSIS — N170 Acute kidney failure with tubular necrosis: Secondary | ICD-10-CM

## 2015-12-14 LAB — BASIC METABOLIC PANEL
Anion gap: 9 (ref 5–15)
BUN: 35 mg/dL — AB (ref 6–20)
CHLORIDE: 100 mmol/L — AB (ref 101–111)
CO2: 29 mmol/L (ref 22–32)
Calcium: 8.4 mg/dL — ABNORMAL LOW (ref 8.9–10.3)
Creatinine, Ser: 2.77 mg/dL — ABNORMAL HIGH (ref 0.44–1.00)
GFR calc Af Amer: 18 mL/min — ABNORMAL LOW (ref >60)
GFR, EST NON AFRICAN AMERICAN: 15 mL/min — AB (ref >60)
GLUCOSE: 107 mg/dL — AB (ref 65–99)
POTASSIUM: 3 mmol/L — AB (ref 3.5–5.1)
Sodium: 138 mmol/L (ref 135–145)

## 2015-12-14 LAB — POTASSIUM: Potassium: 3.5 mmol/L (ref 3.5–5.1)

## 2015-12-14 LAB — MAGNESIUM: Magnesium: 1.8 mg/dL (ref 1.7–2.4)

## 2015-12-14 LAB — CBC
HEMATOCRIT: 29.5 % — AB (ref 35.0–47.0)
Hemoglobin: 9.9 g/dL — ABNORMAL LOW (ref 12.0–16.0)
MCH: 29.5 pg (ref 26.0–34.0)
MCHC: 33.6 g/dL (ref 32.0–36.0)
MCV: 87.7 fL (ref 80.0–100.0)
Platelets: 204 10*3/uL (ref 150–440)
RBC: 3.36 MIL/uL — ABNORMAL LOW (ref 3.80–5.20)
RDW: 14.3 % (ref 11.5–14.5)
WBC: 8.6 10*3/uL (ref 3.6–11.0)

## 2015-12-14 LAB — SURGICAL PATHOLOGY

## 2015-12-14 MED ORDER — POTASSIUM CHLORIDE IN NACL 20-0.9 MEQ/L-% IV SOLN
INTRAVENOUS | Status: DC
  Administered 2015-12-14 – 2015-12-15 (×2): via INTRAVENOUS
  Filled 2015-12-14 (×3): qty 1000

## 2015-12-14 MED ORDER — SODIUM CHLORIDE 0.9% FLUSH
10.0000 mL | Freq: Two times a day (BID) | INTRAVENOUS | Status: DC
  Administered 2015-12-14 – 2015-12-15 (×4): 10 mL

## 2015-12-14 MED ORDER — POTASSIUM CHLORIDE CRYS ER 20 MEQ PO TBCR
40.0000 meq | EXTENDED_RELEASE_TABLET | Freq: Once | ORAL | Status: AC
  Administered 2015-12-14: 40 meq via ORAL
  Filled 2015-12-14: qty 2

## 2015-12-14 MED ORDER — POTASSIUM CHLORIDE CRYS ER 20 MEQ PO TBCR: 60.0000 meq | EXTENDED_RELEASE_TABLET | Freq: Once | ORAL | Status: DC

## 2015-12-14 MED ORDER — SODIUM CHLORIDE 0.9 % IR SOLN: 10.0000 mL | Freq: Two times a day (BID) | Status: DC

## 2015-12-14 NOTE — Progress Notes (Signed)
[facility]  ROUNDING NOTE   Subjective:   Family at bedside.  IR did left nephrostomy and right nephrostomy exchange  [place] 2.77 (8.53)  Objective:  Vital signs in last 24 hours:  Temp:  [98.2 F (36.8 C)-99.5 F (37.5 C)] 98.2 F (36.8 C) (02/17 0606) Pulse Rate:  [57-92] 72 (02/17 0606) Resp:  [12-25] 16 (02/17 0606) BP: (91-165)/(47-64) 130/54 mmHg (02/17 0606) SpO2:  [93 %-100 %] 97 % (02/17 0606)  Weight change:  Filed Weights   12/10/15 1525  Weight: 73.846 kg (162 lb 12.8 oz)    Intake/Output: I/O last 3 completed shifts: In: 1768 [P.O.:240; I.V.:1353; Other:20; IV Piggyback:155] Out: 9629 [BMWUX:3244]   Intake/Output this shift:  Total I/O In: 310 [P.O.:120; I.V.:190] Out: -   Physical Exam: General: NAD, laying in bed  Head: Normocephalic, atraumatic. Moist oral mucosal membranes  Eyes: Anicteric, PERRL  Neck: Supple, trachea midline  Lungs:  Clear to auscultation  Heart: Regular rate and rhythm  Abdomen:  Right nephrostomy tube, Left nephrostomy tube  Extremities:  no edema.  Neurologic: Nonfocal, moving all four extremities  Skin: No lesions  GU: Right nephrostomy with pinkish urine, red urine in foley, left nephrostomy with red urine    Basic Metabolic Panel:  Recent Labs Lab 12/11/15 0125 12/11/15 0556 12/12/15 0510 12/13/15 0506 12/14/15 0422  NA 124* 125* 129* 134* 138  K 4.9 4.3 3.7 3.2* 3.0*  CL 92* 86* 93* 93* 100*  CO2 17* 20* 24 30 29   GLUCOSE 160* 160* 125* 106* 107*  BUN 65* 63* 60* 60* 35*  CREATININE 8.96* 8.97* 8.82* 8.53* 2.77*  CALCIUM 8.5* 8.2* 8.2* 8.3* 8.4*  MG  --   --   --  1.9 1.8    Liver Function Tests: No results for input(s): AST, ALT, ALKPHOS, BILITOT, PROT, ALBUMIN in the last 168 hours. No results for input(s): LIPASE, AMYLASE in the last 168 hours. No results for input(s): AMMONIA in the last 168 hours.  CBC:  Recent Labs Lab 12/10/15 1120 12/11/15 0556 12/12/15 0510  12/13/15 0506 12/14/15 0422  WBC 6.1 9.6 10.1 7.9 8.6  HGB 11.1* 9.7* 9.8* 9.4* 9.9*  HCT 32.9* 28.7* 28.4* 27.2* 29.5*  MCV 86.9 85.9 84.5 86.0 87.7  PLT 231 242 223 188 204    Cardiac Enzymes: No results for input(s): CKTOTAL, CKMB, CKMBINDEX, TROPONINI in the last 168 hours.  BNP: Invalid input(s): POCBNP  CBG: No results for input(s): GLUCAP in the last 168 hours.  Microbiology: Results for orders placed or performed during the hospital encounter of 12/10/15  Surgical PCR screen     Status: None   Collection Time: 12/12/15  5:45 AM  Result Value Ref Range Status   MRSA, PCR NEGATIVE NEGATIVE Final   Staphylococcus aureus NEGATIVE NEGATIVE Final    Comment:        The Xpert SA Assay (FDA approved for NASAL specimens in patients over 13 years of age), is one component of a comprehensive surveillance program.  Test performance has been validated by [facility] for patients greater than or equal to 12 year old. It is not intended to diagnose infection nor to guide or monitor treatment.     Coagulation Studies: No results for input(s): LABPROT, INR in the last 72 hours.  Urinalysis: No results for input(s): COLORURINE, LABSPEC, PHURINE, GLUCOSEU, HGBUR, BILIRUBINUR, KETONESUR, PROTEINUR, UROBILINOGEN, NITRITE, LEUKOCYTESUR in the last 72 hours.  Invalid input(s): APPERANCEUR    Imaging: [place] Intraoperative  12/13/2015  CLINICAL  DATA:  Ultrasound was provided for use by the ordering physician, and a technical charge was applied by the performing facility.  No radiologist interpretation/professional services rendered.   Ir  Nephroureteral Cath Place Left  12/13/2015  CLINICAL DATA:  Bladder tumor and status post right-sided percutaneous nephrostomy tube placement on 12/10/2015. There remains oliguric renal failure and repeat renal ultrasound on 12/12/2015 revealed progressive left hydronephrosis and the patient now requires a left percutaneous nephrostomy tube. EXAM:  1. ULTRASOUND GUIDANCE FOR PUNCTURE OF THE LEFT RENAL COLLECTING SYSTEM. 2. LEFT PERCUTANEOUS NEPHROSTOMY TUBE PLACEMENT. 3. EXCHANGE OF RIGHT PERCUTANEOUS NEPHROSTOMY TUBE. COMPARISON:  Imaging on 12/10/2015 ANESTHESIA/SEDATION: 2.5 mg IV Versed; 75 mcg IV Fentanyl. Total Moderate Sedation Time 30 minutes The patient's level of consciousness and physiologic status were continuously monitored during the procedure by Radiology nursing. CONTRAST:  15 ml Omnipaque 300 MEDICATIONS: 400 mg IV Cipro. Ciprofloxacin was given within two hours of incision. FLUOROSCOPY TIME:  1 minute. PROCEDURE: The procedure, risks, benefits, and alternatives were explained to the patient. Questions regarding the procedure were encouraged and answered. The patient understands and consents to the procedure. A time-out was performed prior to the procedure. The left flank region was prepped with chlorhexidine in a sterile fashion, and a sterile drape was applied covering the operative field. A sterile gown and sterile gloves were used for the procedure. Local anesthesia was provided with 1% Lidocaine. Ultrasound was used to localize the left kidney. Under direct ultrasound guidance, a 21 gauge needle was advanced into the renal collecting system. Ultrasound image documentation was performed. Aspiration of urine sample was performed followed by contrast injection. A transitional dilator was advanced over a guidewire. Percutaneous tract dilatation was then performed over the guidewire. A 10 -French percutaneous nephrostomy tube was then advanced and formed in the collecting system. Catheter position was confirmed by fluoroscopy after contrast injection. The catheter was secured at the skin with a Prolene retention suture and Stat-Lock device. A gravity bag was placed. The pre-existing right nephrostomy tube was prepped with chlorhexidine. Contrast injection was performed under fluoroscopy. The catheter was then cut and removed over a guidewire.  A new 10 French nephrostomy tube was then advanced into the collecting system over the guidewire. The tube was formed and connected to a gravity drainage bag. The catheter was secured at the skin with a Prolene retention suture and StatLock device. COMPLICATIONS: None. FINDINGS: Ultrasound confirms persistent moderate left-sided hydronephrosis. After lower pole access, a 10 French nephrostomy tube was placed and formed in the renal pelvis. Urine return was blood-tinged after tube placement. It was noted that the previously placed 8 French right-sided nephrostomy to have retracted at the skin exit site at the level of a StatLock device. Fluoroscopy and contrast injection confirms retraction of the catheter into a lower pole calyx. The tube was exchanged and upsized for a 10 French nephrostomy tube which was formed in the central collecting system. The tube could not be completely advanced into the renal pelvis. IMPRESSION: 1. Placement of new 24 French left-sided nephrostomy tube to treat progressive hydronephrosis. The tube was formed in the renal pelvis and connected to a gravity drainage bag. 2. Exchange of partially retracted previously placed right-sided nephrostomy tube. A new 10 French nephrostomy tube was advanced into the central collecting system and attached to a gravity drainage bag. Electronically Signed   By: Aletta Edouard M.D.   On: 12/13/2015 17:29   Ir Nephrostomy Exchange Right  12/13/2015  CLINICAL DATA:  Bladder tumor and  status post right-sided percutaneous nephrostomy tube placement on 12/10/2015. There remains oliguric renal failure and repeat renal ultrasound on 12/12/2015 revealed progressive left hydronephrosis and the patient now requires a left percutaneous nephrostomy tube. EXAM: 1. ULTRASOUND GUIDANCE FOR PUNCTURE OF THE LEFT RENAL COLLECTING SYSTEM. 2. LEFT PERCUTANEOUS NEPHROSTOMY TUBE PLACEMENT. 3. EXCHANGE OF RIGHT PERCUTANEOUS NEPHROSTOMY TUBE. COMPARISON:  Imaging on  12/10/2015 ANESTHESIA/SEDATION: 2.5 mg IV Versed; 75 mcg IV Fentanyl. Total Moderate Sedation Time 30 minutes The patient's level of consciousness and physiologic status were continuously monitored during the procedure by Radiology nursing. CONTRAST:  15 ml Omnipaque 300 MEDICATIONS: 400 mg IV Cipro. Ciprofloxacin was given within two hours of incision. FLUOROSCOPY TIME:  1 minute. PROCEDURE: The procedure, risks, benefits, and alternatives were explained to the patient. Questions regarding the procedure were encouraged and answered. The patient understands and consents to the procedure. A time-out was performed prior to the procedure. The left flank region was prepped with chlorhexidine in a sterile fashion, and a sterile drape was applied covering the operative field. A sterile gown and sterile gloves were used for the procedure. Local anesthesia was provided with 1% Lidocaine. Ultrasound was used to localize the left kidney. Under direct ultrasound guidance, a 21 gauge needle was advanced into the renal collecting system. Ultrasound image documentation was performed. Aspiration of urine sample was performed followed by contrast injection. A transitional dilator was advanced over a guidewire. Percutaneous tract dilatation was then performed over the guidewire. A 10 -French percutaneous nephrostomy tube was then advanced and formed in the collecting system. Catheter position was confirmed by fluoroscopy after contrast injection. The catheter was secured at the skin with a Prolene retention suture and Stat-Lock device. A gravity bag was placed. The pre-existing right nephrostomy tube was prepped with chlorhexidine. Contrast injection was performed under fluoroscopy. The catheter was then cut and removed over a guidewire. A new 10 French nephrostomy tube was then advanced into the collecting system over the guidewire. The tube was formed and connected to a gravity drainage bag. The catheter was secured at the skin  with a Prolene retention suture and StatLock device. COMPLICATIONS: None. FINDINGS: Ultrasound confirms persistent moderate left-sided hydronephrosis. After lower pole access, a 10 French nephrostomy tube was placed and formed in the renal pelvis. Urine return was blood-tinged after tube placement. It was noted that the previously placed 8 French right-sided nephrostomy to have retracted at the skin exit site at the level of a StatLock device. Fluoroscopy and contrast injection confirms retraction of the catheter into a lower pole calyx. The tube was exchanged and upsized for a 10 French nephrostomy tube which was formed in the central collecting system. The tube could not be completely advanced into the renal pelvis. IMPRESSION: 1. Placement of new 107 French left-sided nephrostomy tube to treat progressive hydronephrosis. The tube was formed in the renal pelvis and connected to a gravity drainage bag. 2. Exchange of partially retracted previously placed right-sided nephrostomy tube. A new 10 French nephrostomy tube was advanced into the central collecting system and attached to a gravity drainage bag. Electronically Signed   By: Aletta Edouard M.D.   On: 12/13/2015 17:29     Medications:   . 0.9 % NaCl with KCl 20 mEq / L     . cholecalciferol  4,000 Units Oral Daily  . docusate sodium  200 mg Oral BID  . pneumococcal 23 valent vaccine  0.5 mL Intramuscular Tomorrow-1000  . potassium chloride  40 mEq Oral Once  .  sodium chloride flush  10 mL Intracatheter BID  . sodium chloride flush  10 mL Intracatheter BID  . Vitamin D3  50,000 Units Oral Weekly   acetaminophen, diphenhydrAMINE **OR** diphenhydrAMINE, fentaNYL, lidocaine (PF), morphine injection, ondansetron, opium-belladonna, oxybutynin, oxyCODONE  Assessment/ Plan:  [name] is a 82 y.o. black female  with hypertension, who was admitted to [facility] on 12/10/2015 for gross hematuria,right hydronephrosis.  1. Acute renal failure on  chronic kidney disease stage III: Baseline creatinine of 1.14 09/19/2015 with eGFR of 52.  Nonoliguric urine output. Seems to have obstructive uropathy leading to ATN. Urine output and electrolytes are improving.  - no acute indication for dialysis at this time. Monitor urine output, volume status, electrolytes and renal function.  - Hold quinapril.  - Discussed dialysis and plan of care with patient and family  2. Hypokalemia: with post obstructive diuresis: PO potassium ordered - IV potassium with her NS.   3. Hyponatremia: with recent cystoscopy. Improved Na 138.  - IV normal saline  4. Anemia: hemoglobin 9.9. With gross hematuria.  - low threshold to start epo.   5. Hypertension: blood pressure at goal.  - continue amlodipine.       LOS: [place], [place] 2/17/201711:26 AM

## 2015-12-14 NOTE — Progress Notes (Signed)
MEDICATION RELATED CONSULT NOTE - INITIAL   Pharmacy Consult for electrolyte management Indication: hypokalemia  Allergies  Allergen Reactions  . Hydralazine Palpitations    Patient Measurements: Height: 5\' 5"  (165.1 cm) Weight: 162 lb 12.8 oz (73.846 kg) IBW/kg (Calculated) : 57  Vital Signs: Temp: 98.2 F (36.8 C) (02/17 1243) Temp Source: Oral (02/17 1243) BP: 119/53 mmHg (02/17 1243) Pulse Rate: 66 (02/17 1243) Intake/Output from previous day: 02/16 0701 - 02/17 0700 In: 1253 [P.O.:240; I.V.:838; IV Piggyback:155] Out: 3682 [Urine:3682] Intake/Output from this shift: Total I/O In: 340 [P.O.:120; I.V.:200; Other:20] Out: [id number]  Assessment: Pharmacy consulted to manage electrolytes in this 82 year old female with hypokalemia. Patient is s/p nephrostomy and SCr has improved from 8.5 yesterday to 2.7 today. Current estimated CrCl is 16 mL/min. Patient is not on HD.  Magnesium of 1.8 this morning WNL Potassium low at 3.0  2/17 1800 potassium recheck resulted at 3.5  Goal of Therapy:  Electrolytes within normal limits  Plan:  Patient received KCl 40 mEq PO x 1 dose this morning Ordered K/Mg/Phos with AM labs tomorrow. Will follow up on am labs.  Thank you for allowing pharmacy to be part of this patients care.  Paulina Fusi, PharmD, BCPS 12/14/2015 6:47 PM

## 2015-12-14 NOTE — Progress Notes (Signed)
MEDICATION RELATED CONSULT NOTE - INITIAL   Pharmacy Consult for electrolyte management Indication: hypokalemia  Allergies  Allergen Reactions  . Hydralazine Palpitations    Patient Measurements: Height: 5\' 5"  (165.1 cm) Weight: 162 lb 12.8 oz (73.846 kg) IBW/kg (Calculated) : 57  Vital Signs: Temp: 98.2 F (36.8 C) (02/17 1243) Temp Source: Oral (02/17 1243) BP: 119/53 mmHg (02/17 1243) Pulse Rate: 66 (02/17 1243) Intake/Output from previous day: 02/16 0701 - 02/17 0700 In: 1253 [P.O.:240; I.V.:838; IV Piggyback:155] Out: 3682 [Urine:3682] Intake/Output from this shift: Total I/O In: 340 [P.O.:120; I.V.:200; Other:20] Out: 63 [Urine:280]  Assessment: Pharmacy consulted to manage electrolytes in this 82 year old female with hypokalemia. Patient is s/p nephrostomy and SCr has improved from 8.5 yesterday to 2.7 today. Current estimated CrCl is 16 mL/min. Patient is not on HD.  Magnesium of 1.8 this morning WNL Potassium low at 3.0  Goal of Therapy:  Electrolytes within normal limits  Plan:  Patient received KCl 40 mEq PO x 1 dose this morning Will recheck potassium at 1800 Ordered K/Mg/Phos with AM labs tomorrow  Thank you for allowing pharmacy to be part of this patients care.  Lenis Noon, PharmD Clinical Pharmacist 12/14/2015,3:49 PM

## 2015-12-14 NOTE — Evaluation (Signed)
Physical Therapy Evaluation Patient Details Name: Tonya Moreno MRN: [id number] DOB: Jul 13, 1934 Today's Date: 12/14/2015   History of Present Illness  82 yo F who was admitted to [facility] on 12/10/2015 for gross hematuria,right hydronephrosis and bladder mass.  PMH includes DOE, HTN, chronic kidney disease.   Clinical Impression  Pt presents with generalized weakness and difficulty walking since hospitalization. She has B nephrostomy tubes, foley catheter and IV placement. Pt c/o no pain at rest but c/o B feet pain in standing or walking. She has B ankle edema on examination. Pt is able to transfer with min guard and QC and ambulate 125 ft with QC and min guard. SpO2 96% RA and HR 75 bpm after ambulation. Pt will benefit from skilled PT services to increase functional I and mobility for safe discharge.     Follow Up Recommendations Home health PT;Supervision for mobility/OOB    Equipment Recommendations   (Recommend use of QC or FWW (depending on pain in feet).) Pt states she has both.   Recommendations for Other Services       Precautions / Restrictions Precautions Precautions: Fall Restrictions Weight Bearing Restrictions: No      Mobility  Bed Mobility               General bed mobility comments: Pt up in recliner, NT.  Transfers Overall transfer level: Needs assistance Equipment used: Quad cane Transfers: Sit to/from [employer] to Stand: Min guard Stand pivot transfers: Min guard       General transfer comment: VC for hand placement  Ambulation/Gait Ambulation/Gait assistance: Min guard Ambulation Distance (Feet): 125 Feet Assistive device: Quad cane Gait Pattern/deviations: Step-to pattern;Decreased stride length;Antalgic;Narrow base of support   Gait velocity interpretation: Below normal speed for age/gender General Gait Details: C/o B feet soreness. Ankle are swollen. RN notified.  Stairs            Wheelchair Mobility     Modified Rankin (Stroke Patients Only)       Balance Overall balance assessment: Needs assistance Sitting-balance support: Feet supported Sitting balance-Leahy Scale: Good Sitting balance - Comments: no deficits   Standing balance support: Single extremity supported Standing balance-Leahy Scale: Fair Standing balance comment: no LOB, c/o feet soreness                             Pertinent Vitals/Pain Pain Assessment: No/denies pain    Home Living Family/patient expects to be discharged to:: Private residence Living Arrangements: Alone Available Help at Discharge: Family Type of Home: Apartment Home Access: Level entry     Home Layout: One level Home Equipment: Latina Craver - 2 wheels Additional Comments: Pt plans to live with her daughter temporarily after discharge from hospital. There is a ramped entrance.    Prior Function Level of Independence: Independent with assistive device(s)         Comments: I ADLs, drives     Hand Dominance        Extremity/Trunk Assessment   Upper Extremity Assessment: Generalized weakness           Lower Extremity Assessment: Generalized weakness         Communication   Communication: No difficulties  Cognition Arousal/Alertness: Awake/alert Behavior During Therapy: WFL for tasks assessed/performed Overall Cognitive Status: Within Functional Limits for tasks assessed                      General  Comments General comments (skin integrity, edema, etc.): B ankle edema. RN notified.    Exercises Other Exercises Other Exercises: B LE therex seated: LAQs, marching, ankle pumps x10 each. Cues for technique. Instructed to perform ankle pumps and elevate LEs to reduce ankle edema.      Assessment/Plan    PT Assessment Patient needs continued PT services  PT Diagnosis Difficulty walking;Generalized weakness   PT Problem List Decreased strength;Decreased activity tolerance;Decreased  balance;Decreased mobility;Pain  PT Treatment Interventions Gait training;Therapeutic activities;Therapeutic exercise;Balance training;Patient/family education   PT Goals (Current goals can be found in the Care Plan section) Acute Rehab PT Goals Patient Stated Goal: to get home PT Goal Formulation: With patient Time For Goal Achievement: 12/28/15 Potential to Achieve Goals: Good    Frequency Min 2X/week   Barriers to discharge   none    Co-evaluation               End of Session Equipment Utilized During Treatment: Gait belt Activity Tolerance: Patient limited by fatigue;No increased pain Patient left: in chair;with call bell/phone within reach;with chair alarm set;with family/visitor present Nurse Communication: Mobility status         Time: [id number] PT Time Calculation (min) (ACUTE ONLY): 30 min   Charges:   PT Evaluation $PT Eval Moderate Complexity: 1 Procedure PT Treatments $Therapeutic Exercise: 8-22 mins   PT G Codes:       Neoma Laming, PT, DPT  12/14/2015, 11:56 AM [phone]

## 2015-12-14 NOTE — Progress Notes (Signed)
[facility] NAME: Tonya Moreno    MR#:  [id number]  DATE OF BIRTH:  [birth year]  SUBJECTIVE:  CHIEF COMPLAINT:  No chief complaint on file.  No complaint. S/p nephrostomy. REVIEW OF SYSTEMS:  CONSTITUTIONAL: No fever, fatigue or weakness.  EYES: No blurred or double vision.  EARS, NOSE, AND THROAT: No tinnitus or ear pain.  RESPIRATORY: No cough, shortness of breath, wheezing or hemoptysis.  CARDIOVASCULAR: No chest pain, orthopnea, edema.  GASTROINTESTINAL: No nausea, vomiting, diarrhea or abdominal pain.  GENITOURINARY: No dysuria, hematuria.  ENDOCRINE: No polyuria, nocturia, but has hematuria. HEMATOLOGY: No anemia, easy bruising or bleeding SKIN: No rash or lesion. MUSCULOSKELETAL: No joint pain or arthritis.   NEUROLOGIC: No tingling, numbness, weakness.  PSYCHIATRY: No anxiety or depression.   DRUG ALLERGIES:   Allergies  Allergen Reactions  . Hydralazine Palpitations    VITALS:  Blood pressure 119/53, pulse 66, temperature 98.2 F (36.8 C), temperature source Oral, resp. rate 17, height 5\' 5"  (1.651 m), weight 73.846 kg (162 lb 12.8 oz), SpO2 100 %.  PHYSICAL EXAMINATION:  GENERAL:  82 y.o.-year-old patient lying in the bed with no acute distress.  EYES: Pupils equal, round, reactive to light and accommodation. No scleral icterus. Extraocular muscles intact.  HEENT: Head atraumatic, normocephalic. Oropharynx and nasopharynx clear.  NECK:  Supple, no jugular venous distention. No thyroid enlargement, no tenderness.  LUNGS: Normal breath sounds bilaterally, no wheezing, rales,rhonchi or crepitation. No use of accessory muscles of respiration.  CARDIOVASCULAR: S1, S2 normal. No murmurs, rubs, or gallops.  ABDOMEN: Soft, nontender, nondistended. Bowel sounds present. No organomegaly or mass. Bilateral nephrostomy tubes with pink urine, no clots, draining well. EXTREMITIES: No pedal edema, cyanosis, or  clubbing.  NEUROLOGIC: Cranial nerves II through XII are intact. Muscle strength 5/5 in all extremities. Sensation intact. Gait not checked.  PSYCHIATRIC: The patient is alert and oriented x 3.  SKIN: No obvious rash, lesion, or ulcer.    LABORATORY PANEL:   CBC  Recent Labs Lab 12/14/15 0422  WBC 8.6  HGB 9.9*  HCT 29.5*  PLT 204   ------------------------------------------------------------------------------------------------------------------  Chemistries   Recent Labs Lab 12/14/15 0422  NA 138  K 3.0*  CL 100*  CO2 29  GLUCOSE 107*  BUN 35*  CREATININE 2.77*  CALCIUM 8.4*  MG 1.8   ------------------------------------------------------------------------------------------------------------------  Cardiac Enzymes No results for input(s): TROPONINI in the last 168 hours. ------------------------------------------------------------------------------------------------------------------  RADIOLOGY:  [place] Intraoperative  12/13/2015  CLINICAL DATA:  Ultrasound was provided for use by the ordering physician, and a technical charge was applied by the performing facility.  No radiologist interpretation/professional services rendered.   Ir  Nephroureteral Cath Place Left  12/13/2015  CLINICAL DATA:  Bladder tumor and status post right-sided percutaneous nephrostomy tube placement on 12/10/2015. There remains oliguric renal failure and repeat renal ultrasound on 12/12/2015 revealed progressive left hydronephrosis and the patient now requires a left percutaneous nephrostomy tube. EXAM: 1. ULTRASOUND GUIDANCE FOR PUNCTURE OF THE LEFT RENAL COLLECTING SYSTEM. 2. LEFT PERCUTANEOUS NEPHROSTOMY TUBE PLACEMENT. 3. EXCHANGE OF RIGHT PERCUTANEOUS NEPHROSTOMY TUBE. COMPARISON:  Imaging on 12/10/2015 ANESTHESIA/SEDATION: 2.5 mg IV Versed; 75 mcg IV Fentanyl. Total Moderate Sedation Time 30 minutes The patient's level of consciousness and physiologic status were continuously monitored during  the procedure by Radiology nursing. CONTRAST:  15 ml Omnipaque 300 MEDICATIONS: 400 mg IV Cipro. Ciprofloxacin was given within two hours of incision. FLUOROSCOPY TIME:  1 minute. PROCEDURE: The procedure,  risks, benefits, and alternatives were explained to the patient. Questions regarding the procedure were encouraged and answered. The patient understands and consents to the procedure. A time-out was performed prior to the procedure. The left flank region was prepped with chlorhexidine in a sterile fashion, and a sterile drape was applied covering the operative field. A sterile gown and sterile gloves were used for the procedure. Local anesthesia was provided with 1% Lidocaine. Ultrasound was used to localize the left kidney. Under direct ultrasound guidance, a 21 gauge needle was advanced into the renal collecting system. Ultrasound image documentation was performed. Aspiration of urine sample was performed followed by contrast injection. A transitional dilator was advanced over a guidewire. Percutaneous tract dilatation was then performed over the guidewire. A 10 -French percutaneous nephrostomy tube was then advanced and formed in the collecting system. Catheter position was confirmed by fluoroscopy after contrast injection. The catheter was secured at the skin with a Prolene retention suture and Stat-Lock device. A gravity bag was placed. The pre-existing right nephrostomy tube was prepped with chlorhexidine. Contrast injection was performed under fluoroscopy. The catheter was then cut and removed over a guidewire. A new 10 French nephrostomy tube was then advanced into the collecting system over the guidewire. The tube was formed and connected to a gravity drainage bag. The catheter was secured at the skin with a Prolene retention suture and StatLock device. COMPLICATIONS: None. FINDINGS: Ultrasound confirms persistent moderate left-sided hydronephrosis. After lower pole access, a 10 French nephrostomy tube  was placed and formed in the renal pelvis. Urine return was blood-tinged after tube placement. It was noted that the previously placed 8 French right-sided nephrostomy to have retracted at the skin exit site at the level of a StatLock device. Fluoroscopy and contrast injection confirms retraction of the catheter into a lower pole calyx. The tube was exchanged and upsized for a 10 French nephrostomy tube which was formed in the central collecting system. The tube could not be completely advanced into the renal pelvis. IMPRESSION: 1. Placement of new 16 French left-sided nephrostomy tube to treat progressive hydronephrosis. The tube was formed in the renal pelvis and connected to a gravity drainage bag. 2. Exchange of partially retracted previously placed right-sided nephrostomy tube. A new 10 French nephrostomy tube was advanced into the central collecting system and attached to a gravity drainage bag. Electronically Signed   By: Aletta Edouard M.D.   On: 12/13/2015 17:29   Ir Nephrostomy Exchange Right  12/13/2015  CLINICAL DATA:  Bladder tumor and status post right-sided percutaneous nephrostomy tube placement on 12/10/2015. There remains oliguric renal failure and repeat renal ultrasound on 12/12/2015 revealed progressive left hydronephrosis and the patient now requires a left percutaneous nephrostomy tube. EXAM: 1. ULTRASOUND GUIDANCE FOR PUNCTURE OF THE LEFT RENAL COLLECTING SYSTEM. 2. LEFT PERCUTANEOUS NEPHROSTOMY TUBE PLACEMENT. 3. EXCHANGE OF RIGHT PERCUTANEOUS NEPHROSTOMY TUBE. COMPARISON:  Imaging on 12/10/2015 ANESTHESIA/SEDATION: 2.5 mg IV Versed; 75 mcg IV Fentanyl. Total Moderate Sedation Time 30 minutes The patient's level of consciousness and physiologic status were continuously monitored during the procedure by Radiology nursing. CONTRAST:  15 ml Omnipaque 300 MEDICATIONS: 400 mg IV Cipro. Ciprofloxacin was given within two hours of incision. FLUOROSCOPY TIME:  1 minute. PROCEDURE: The procedure,  risks, benefits, and alternatives were explained to the patient. Questions regarding the procedure were encouraged and answered. The patient understands and consents to the procedure. A time-out was performed prior to the procedure. The left flank region was prepped with chlorhexidine in a sterile  fashion, and a sterile drape was applied covering the operative field. A sterile gown and sterile gloves were used for the procedure. Local anesthesia was provided with 1% Lidocaine. Ultrasound was used to localize the left kidney. Under direct ultrasound guidance, a 21 gauge needle was advanced into the renal collecting system. Ultrasound image documentation was performed. Aspiration of urine sample was performed followed by contrast injection. A transitional dilator was advanced over a guidewire. Percutaneous tract dilatation was then performed over the guidewire. A 10 -French percutaneous nephrostomy tube was then advanced and formed in the collecting system. Catheter position was confirmed by fluoroscopy after contrast injection. The catheter was secured at the skin with a Prolene retention suture and Stat-Lock device. A gravity bag was placed. The pre-existing right nephrostomy tube was prepped with chlorhexidine. Contrast injection was performed under fluoroscopy. The catheter was then cut and removed over a guidewire. A new 10 French nephrostomy tube was then advanced into the collecting system over the guidewire. The tube was formed and connected to a gravity drainage bag. The catheter was secured at the skin with a Prolene retention suture and StatLock device. COMPLICATIONS: None. FINDINGS: Ultrasound confirms persistent moderate left-sided hydronephrosis. After lower pole access, a 10 French nephrostomy tube was placed and formed in the renal pelvis. Urine return was blood-tinged after tube placement. It was noted that the previously placed 8 French right-sided nephrostomy to have retracted at the skin exit site  at the level of a StatLock device. Fluoroscopy and contrast injection confirms retraction of the catheter into a lower pole calyx. The tube was exchanged and upsized for a 10 French nephrostomy tube which was formed in the central collecting system. The tube could not be completely advanced into the renal pelvis. IMPRESSION: 1. Placement of new 14 French left-sided nephrostomy tube to treat progressive hydronephrosis. The tube was formed in the renal pelvis and connected to a gravity drainage bag. 2. Exchange of partially retracted previously placed right-sided nephrostomy tube. A new 10 French nephrostomy tube was advanced into the central collecting system and attached to a gravity drainage bag. Electronically Signed   By: Aletta Edouard M.D.   On: 12/13/2015 17:29    EKG:   Orders placed or performed during the hospital encounter of 11/01/15  . EKG 12-Lead  . EKG 12-Lead    ASSESSMENT AND PLAN:   1. Acute renal failure on chronic kidney disease stage III, Seems to have obstructive uropathy leading to ATN.  Hold quinapril.  Improving, Cr. Down to 2.77 from 8.53 after Bilateral nephrostomy tube placement.  F/u BMP.  * Acute renal failure with gross hematuria post obstructive etiology with bladder tumor and right-sided hydronephrosis and left-sided hydronephrosis Status post tumor resection and nephrostomy tube placement by Dr. Erlene Quan. S/p Left nephrostomy tube placement; right nephrostomy exchange by radiologist.  hold ASA.  2. Metabolic acidosis. Improved with bicarb iv, transition to NS iv.  3. Hyponatremia. Improved.  4. Essential hypertension. Hold quinapril and hold amlodipine due to low side BP.   5. Anemia of chronic disease. Stable.  * hypokalemia. KCl given. F/u BMP.  All the records are reviewed and case discussed with Care Management/Social Workerr. Management plans discussed with the patient, her daughter and they are in agreement.  CODE STATUS: full code.  TOTAL  TIME TAKING CARE OF THIS PATIENT: 35 minutes.  Greater than 50% time was spent on coordination of care and face-to-face counseling.  POSSIBLE D/C IN 2 DAYS, DEPENDING ON CLINICAL CONDITION.   Bridgett Larsson,  Saidy Ormand M.D on 12/14/2015 at 1:33 PM  Between 7am to 6pm - Pager - [phone]  After 6pm go to [email or web address] - password [email or web address] [facility]  Office  [phone]  CC: Primary care physician; Letta Median, MD

## 2015-12-14 NOTE — Care Management (Addendum)
Has nephrostomy tube to foley bag.  She has been out of bed ambulating with physical therapy.  She will discharge  to the home of her daughter- April [place]: [address].  Phone [phone].  Agency preference is Nurse, learning disability.  Referral for SN PT and Aide called and accepted.  Referral source aware that discharge address is within CM note.

## 2015-12-14 NOTE — Progress Notes (Signed)
Urology Follow Up  Subjective: Cr improved dramatically with excelled UOP bilaterally from nephrostomy R tube exchanged, L tube placed yesterday, tolerated procedure well Reports feeling better this AM   Anti-infectives: Anti-infectives    Start     Dose/Rate Route Frequency Ordered Stop   12/13/15 1045  ciprofloxacin (CIPRO) IVPB 400 mg     400 mg 200 mL/hr over 60 Minutes Intravenous  Once 12/13/15 1037 12/13/15 1150   12/13/15 1040  ciprofloxacin (CIPRO) 400 MG/200ML IVPB    Comments:  Darlin Drop: cabinet override      12/13/15 1040 12/13/15 2244   12/10/15 1545  ceFAZolin (ANCEF) IVPB 1 g/50 mL premix     1 g 100 mL/hr over 30 Minutes Intravenous  Once 12/10/15 1541 12/10/15 1657   12/10/15 1245  ceFAZolin (ANCEF) IVPB 1 g/50 mL premix  Status:  Discontinued     1 g 100 mL/hr over 30 Minutes Intravenous Every 8 hours 12/10/15 1232 12/10/15 1337   12/10/15 0611  ceFAZolin (ANCEF) 2-3 GM-% IVPB SOLR    Comments:  Rexanne Mano: cabinet override      12/10/15 0611 12/10/15 1814   12/10/15 0600  ceFAZolin (ANCEF) IVPB 2 g/50 mL premix     2 g 100 mL/hr over 30 Minutes Intravenous 30 min pre-op 12/10/15 0600 12/10/15 0730      Current Facility-Administered Medications  Medication Dose Route Frequency Provider Last Rate Last Dose  . 0.9 % NaCl with KCl 20 mEq/ L  infusion   Intravenous Continuous Lavonia Dana, MD 50 mL/hr at 12/14/15 1148    . acetaminophen (TYLENOL) tablet 650 mg  650 mg Oral Q4H PRN Hollice Espy, MD      . cholecalciferol (VITAMIN D) tablet 4,000 Units  4,000 Units Oral Daily Hollice Espy, MD   4,000 Units at 12/14/15 1146  . diphenhydrAMINE (BENADRYL) injection 12.5 mg  12.5 mg Intravenous Q6H PRN Hollice Espy, MD       Or  . diphenhydrAMINE (BENADRYL) 12.5 MG/5ML elixir 12.5 mg  12.5 mg Oral Q6H PRN Hollice Espy, MD      . docusate sodium (COLACE) capsule 200 mg  200 mg Oral BID Hollice Espy, MD   200 mg at 12/14/15 1146  . fentaNYL  (SUBLIMAZE) injection   Intravenous PRN Aletta Edouard, MD   25 mcg at 12/13/15 1443  . lidocaine (PF) (XYLOCAINE) 1 % injection    PRN Aletta Edouard, MD   3 mL at 12/13/15 1435  . morphine 2 MG/ML injection 2-4 mg  2-4 mg Intravenous Q2H PRN Hollice Espy, MD      . ondansetron [facility]) injection 4 mg  4 mg Intravenous Q4H PRN Hollice Espy, MD   4 mg at 12/11/15 2215  . opium-belladonna (B&O SUPPRETTES) 16.2-60 MG suppository 1 suppository  1 suppository Rectal Q6H PRN Hollice Espy, MD      . oxybutynin (DITROPAN) tablet 5 mg  5 mg Oral Q8H PRN Hollice Espy, MD      . oxyCODONE (Oxy IR/ROXICODONE) immediate release tablet 5 mg  5 mg Oral Q4H PRN Hollice Espy, MD      . pneumococcal 23 valent vaccine (PNU-IMMUNE) injection 0.5 mL  0.5 mL Intramuscular Tomorrow-1000 Hollice Espy, MD   0.5 mL at 12/12/15 1000  . sodium chloride flush (NS) 0.9 % injection 10 mL  10 mL Intracatheter BID Aletta Edouard, MD   10 mL at 12/14/15 1147  . sodium chloride flush (NS) 0.9 % injection 10 mL  10 mL Intracatheter  BID Aletta Edouard, MD   10 mL at 12/14/15 1147  . Vitamin D3 CAPS 50,000 Units  50,000 Units Oral Weekly Hollice Espy, MD         Objective: Vital signs in last 24 hours: Temp:  [98.2 F (36.8 C)-99.5 F (37.5 C)] 98.2 F (36.8 C) (02/17 0606) Pulse Rate:  [57-92] 68 (02/17 1136) Resp:  [12-25] 16 (02/17 0606) BP: (91-165)/(47-64) 130/54 mmHg (02/17 0606) SpO2:  [93 %-100 %] 98 % (02/17 1136)  Intake/Output from previous day: 02/16 0701 - 02/17 0700 In: 1253 [P.O.:240; I.V.:838; IV Piggyback:155] Out: 3682 [Urine:3682]  Break down 3350 right nephrostomy, Foley 100  Intake/Output this shift: Total I/O In: 340 [P.O.:120; I.V.:200; Other:20] Out: 92 [Urine:280]   Physical Exam  Constitutional: She is oriented to person, place, and time and well-developed, well-nourished, and in no distress.  Daughter at bedside this morning  HENT:  Head: Normocephalic and atraumatic.   Neck: Normal range of motion.  Cardiovascular: Normal rate.   Abdominal: Soft. Bowel sounds are normal.  Reducible abdominal wall hernia  Genitourinary:  Urethral Foley draining scant bloody urine. Bilateral nephrostomy tubes with pink urine, no clots, draining well.  Musculoskeletal: Normal range of motion.  Neurological: She is alert and oriented to person, place, and time.  Skin: Skin is warm and dry.  Psychiatric: Affect and judgment normal.    Lab Results:   Recent Labs  12/13/15 0506 12/14/15 0422  WBC 7.9 8.6  HGB 9.4* 9.9*  HCT 27.2* 29.5*  PLT 188 204   BMET  Recent Labs  12/13/15 0506 12/14/15 0422  NA 134* 138  K 3.2* 3.0*  CL 93* 100*  CO2 30 29  GLUCOSE 106* 107*  BUN 60* 35*  CREATININE 8.53* 2.77*  CALCIUM 8.3* 8.4*   PT/INR No results for input(s): LABPROT, INR in the last 72 hours. Studies/Results: [place] Intraoperative  12/13/2015  CLINICAL DATA:  Ultrasound was provided for use by the ordering physician, and a technical charge was applied by the performing facility.  No radiologist interpretation/professional services rendered.   Ir  Nephroureteral Cath Place Left  12/13/2015  CLINICAL DATA:  Bladder tumor and status post right-sided percutaneous nephrostomy tube placement on 12/10/2015. There remains oliguric renal failure and repeat renal ultrasound on 12/12/2015 revealed progressive left hydronephrosis and the patient now requires a left percutaneous nephrostomy tube. EXAM: 1. ULTRASOUND GUIDANCE FOR PUNCTURE OF THE LEFT RENAL COLLECTING SYSTEM. 2. LEFT PERCUTANEOUS NEPHROSTOMY TUBE PLACEMENT. 3. EXCHANGE OF RIGHT PERCUTANEOUS NEPHROSTOMY TUBE. COMPARISON:  Imaging on 12/10/2015 ANESTHESIA/SEDATION: 2.5 mg IV Versed; 75 mcg IV Fentanyl. Total Moderate Sedation Time 30 minutes The patient's level of consciousness and physiologic status were continuously monitored during the procedure by Radiology nursing. CONTRAST:  15 ml Omnipaque 300 MEDICATIONS: 400  mg IV Cipro. Ciprofloxacin was given within two hours of incision. FLUOROSCOPY TIME:  1 minute. PROCEDURE: The procedure, risks, benefits, and alternatives were explained to the patient. Questions regarding the procedure were encouraged and answered. The patient understands and consents to the procedure. A time-out was performed prior to the procedure. The left flank region was prepped with chlorhexidine in a sterile fashion, and a sterile drape was applied covering the operative field. A sterile gown and sterile gloves were used for the procedure. Local anesthesia was provided with 1% Lidocaine. Ultrasound was used to localize the left kidney. Under direct ultrasound guidance, a 21 gauge needle was advanced into the renal collecting system. Ultrasound image documentation was performed. Aspiration of urine sample  was performed followed by contrast injection. A transitional dilator was advanced over a guidewire. Percutaneous tract dilatation was then performed over the guidewire. A 10 -French percutaneous nephrostomy tube was then advanced and formed in the collecting system. Catheter position was confirmed by fluoroscopy after contrast injection. The catheter was secured at the skin with a Prolene retention suture and Stat-Lock device. A gravity bag was placed. The pre-existing right nephrostomy tube was prepped with chlorhexidine. Contrast injection was performed under fluoroscopy. The catheter was then cut and removed over a guidewire. A new 10 French nephrostomy tube was then advanced into the collecting system over the guidewire. The tube was formed and connected to a gravity drainage bag. The catheter was secured at the skin with a Prolene retention suture and StatLock device. COMPLICATIONS: None. FINDINGS: Ultrasound confirms persistent moderate left-sided hydronephrosis. After lower pole access, a 10 French nephrostomy tube was placed and formed in the renal pelvis. Urine return was blood-tinged after tube  placement. It was noted that the previously placed 8 French right-sided nephrostomy to have retracted at the skin exit site at the level of a StatLock device. Fluoroscopy and contrast injection confirms retraction of the catheter into a lower pole calyx. The tube was exchanged and upsized for a 10 French nephrostomy tube which was formed in the central collecting system. The tube could not be completely advanced into the renal pelvis. IMPRESSION: 1. Placement of new 82 French left-sided nephrostomy tube to treat progressive hydronephrosis. The tube was formed in the renal pelvis and connected to a gravity drainage bag. 2. Exchange of partially retracted previously placed right-sided nephrostomy tube. A new 10 French nephrostomy tube was advanced into the central collecting system and attached to a gravity drainage bag. Electronically Signed   By: Aletta Edouard M.D.   On: 12/13/2015 17:29   Ir Nephrostomy Exchange Right  12/13/2015  CLINICAL DATA:  Bladder tumor and status post right-sided percutaneous nephrostomy tube placement on 12/10/2015. There remains oliguric renal failure and repeat renal ultrasound on 12/12/2015 revealed progressive left hydronephrosis and the patient now requires a left percutaneous nephrostomy tube. EXAM: 1. ULTRASOUND GUIDANCE FOR PUNCTURE OF THE LEFT RENAL COLLECTING SYSTEM. 2. LEFT PERCUTANEOUS NEPHROSTOMY TUBE PLACEMENT. 3. EXCHANGE OF RIGHT PERCUTANEOUS NEPHROSTOMY TUBE. COMPARISON:  Imaging on 12/10/2015 ANESTHESIA/SEDATION: 2.5 mg IV Versed; 75 mcg IV Fentanyl. Total Moderate Sedation Time 30 minutes The patient's level of consciousness and physiologic status were continuously monitored during the procedure by Radiology nursing. CONTRAST:  15 ml Omnipaque 300 MEDICATIONS: 400 mg IV Cipro. Ciprofloxacin was given within two hours of incision. FLUOROSCOPY TIME:  1 minute. PROCEDURE: The procedure, risks, benefits, and alternatives were explained to the patient. Questions  regarding the procedure were encouraged and answered. The patient understands and consents to the procedure. A time-out was performed prior to the procedure. The left flank region was prepped with chlorhexidine in a sterile fashion, and a sterile drape was applied covering the operative field. A sterile gown and sterile gloves were used for the procedure. Local anesthesia was provided with 1% Lidocaine. Ultrasound was used to localize the left kidney. Under direct ultrasound guidance, a 21 gauge needle was advanced into the renal collecting system. Ultrasound image documentation was performed. Aspiration of urine sample was performed followed by contrast injection. A transitional dilator was advanced over a guidewire. Percutaneous tract dilatation was then performed over the guidewire. A 10 -French percutaneous nephrostomy tube was then advanced and formed in the collecting system. Catheter position was confirmed by fluoroscopy  after contrast injection. The catheter was secured at the skin with a Prolene retention suture and Stat-Lock device. A gravity bag was placed. The pre-existing right nephrostomy tube was prepped with chlorhexidine. Contrast injection was performed under fluoroscopy. The catheter was then cut and removed over a guidewire. A new 10 French nephrostomy tube was then advanced into the collecting system over the guidewire. The tube was formed and connected to a gravity drainage bag. The catheter was secured at the skin with a Prolene retention suture and StatLock device. COMPLICATIONS: None. FINDINGS: Ultrasound confirms persistent moderate left-sided hydronephrosis. After lower pole access, a 10 French nephrostomy tube was placed and formed in the renal pelvis. Urine return was blood-tinged after tube placement. It was noted that the previously placed 8 French right-sided nephrostomy to have retracted at the skin exit site at the level of a StatLock device. Fluoroscopy and contrast injection  confirms retraction of the catheter into a lower pole calyx. The tube was exchanged and upsized for a 10 French nephrostomy tube which was formed in the central collecting system. The tube could not be completely advanced into the renal pelvis. IMPRESSION: 1. Placement of new 33 French left-sided nephrostomy tube to treat progressive hydronephrosis. The tube was formed in the renal pelvis and connected to a gravity drainage bag. 2. Exchange of partially retracted previously placed right-sided nephrostomy tube. A new 10 French nephrostomy tube was advanced into the central collecting system and attached to a gravity drainage bag. Electronically Signed   By: Aletta Edouard M.D.   On: 12/13/2015 17:29    Assessment: POD 4 s/p Procedure(s): CYSTOSCOPY WITH RETROGRADE PYELOGRAM TRANSURETHRAL RESECTION OF BLADDER TUMOR (TURBT) EXAM UNDER ANESTHESIA  1. Acute on chronic renal failure/ metabolic acidosis- s/p bilateral perc tubes, Cr normalizing, K repleated by nephrology who is following/ medicine as awell  2. Bladder mass- pathology pending, invasive carcinoma prelim, d/w Dr. Rebecca Eaton, final stain pending  3. Anemia- dilution/ acute blood loss anemia/ chronic diease.  Stable.   4. Hyponatremia: Improving    Plan: F/u nephrology/ IM consult recs Trend BMP, UOP Awaiting pathology results PT today Consider staging CT once pathology resulted Likely home over weekend with nephrostomy in place x 2 D/c Foley   LOS: 3 days    Hollice Espy 12/14/2015

## 2015-12-15 LAB — RENAL FUNCTION PANEL
ANION GAP: 3 — AB (ref 5–15)
Albumin: 2.4 g/dL — ABNORMAL LOW (ref 3.5–5.0)
BUN: 21 mg/dL — ABNORMAL HIGH (ref 6–20)
CHLORIDE: 103 mmol/L (ref 101–111)
CO2: 33 mmol/L — AB (ref 22–32)
CREATININE: 0.91 mg/dL (ref 0.44–1.00)
Calcium: 8.7 mg/dL — ABNORMAL LOW (ref 8.9–10.3)
GFR, EST NON AFRICAN AMERICAN: 58 mL/min — AB (ref >60)
Glucose, Bld: 101 mg/dL — ABNORMAL HIGH (ref 65–99)
POTASSIUM: 3.4 mmol/L — AB (ref 3.5–5.1)
Phosphorus: 1.8 mg/dL — ABNORMAL LOW (ref 2.5–4.6)
Sodium: 139 mmol/L (ref 135–145)

## 2015-12-15 LAB — PHOSPHORUS: PHOSPHORUS: 1.8 mg/dL — AB (ref 2.5–4.6)

## 2015-12-15 LAB — MAGNESIUM: MAGNESIUM: 1.8 mg/dL (ref 1.7–2.4)

## 2015-12-15 MED ORDER — POTASSIUM PHOSPHATES 15 MMOLE/5ML IV SOLN
15.0000 mmol | Freq: Once | INTRAVENOUS | Status: AC
  Administered 2015-12-15: 15 mmol via INTRAVENOUS
  Filled 2015-12-15: qty 5

## 2015-12-15 MED ORDER — BISACODYL 10 MG RE SUPP
10.0000 mg | Freq: Once | RECTAL | Status: AC
  Administered 2015-12-15: 10 mg via RECTAL
  Filled 2015-12-15: qty 1

## 2015-12-15 NOTE — Discharge Instructions (Addendum)
Take medications as prescribed.  Dr. Pilar Jarvis (urology) would like you to refrain from heavy lifting, strenuous activity, or driving. Please contact your doctors office if you should have any questions or concerns. Percutaneous Nephrostomy Percutaneous nephrostomy is the insertion of a flexible tube into your kidney through your back. This is done to provide access to an obstructed kidney. The goal of this procedure is to allow the urine that is produced in the kidney to drain, which will relieve pressure or infection from damaging your kidney. This will allow your health care provider to identify the cause of the obstruction and plan appropriate treatment. LET [facility] CARE PROVIDER KNOW ABOUT:  Any allergies you have.  All medicines you are taking, including vitamins, herbs, eye drops, creams, and over-the-counter medicines.  Previous problems you or members of your family have had with the use of anesthetics.  Any blood disorders you have.  Previous surgeries you have had.  Medical conditions you have.  Possibility of pregnancy, if this applies. RISKS AND COMPLICATIONS Generally, this is a safe procedure. However, as with any procedure, problems can occur. Possible problems include:  Infection.  Damage to the organs surrounding your kidney. BEFORE THE PROCEDURE Your health care provider may want you to have blood tests. These tests can help tell how well your kidneys and liver are working. They can also show how well your blood clots. If you take anticoagulant medicine, sometimes called blood thinners, ask your health care provider when you should stop taking them. Make arrangements for someone to take you home after the procedure, if needed. PROCEDURE The procedure is performed as follows:  An intravenous IV catheter will be inserted into one of the veins in your arm. Medicine will be able to flow directly into your body through this catheter. You may be given medicines through  this tube to help prevent nausea and pain, and antibiotics to help prevent infection.   You will be placed on your stomach and given medicine that numbs the site (local anesthetic) where the percutaneous nephrostomy tube will be inserted.  You will be given a medicine that makes you go to sleep (general anesthetic).  The percutaneous nephrostomy tube, which is thin and flexible, will be inserted into a needle.  The needle will be inserted into your body and guided to your kidney with the help of an imaging method that uses X-ray images (fluoroscopy).  A dye will be injected through the nephrostomy tube. Then, X-ray images that highlight your kidney will be taken.  The needle is then removed, but the nephrostomy tube will be left in your kidney. The tube will drain urine from your kidney to a collection bag outside your body. The tube is usually secured to your skin with stitches (sutures). AFTER THE PROCEDURE   You will stay in a recovery area until the sedation has worn off. Your blood pressure and pulse will be checked.  You will need to remain lying down for several hours.   This information is not intended to replace advice given to you by your health care provider. Make sure you discuss any questions you have with your health care provider.   Document Released: 08/03/2013 Document Revised: 11/03/2014 Document Reviewed: 08/03/2013 Elsevier Interactive Patient Education [employer].

## 2015-12-15 NOTE — Discharge Summary (Signed)
Date of admission: 12/10/2015  Date of discharge: 12/15/2015  Admission diagnosis: Bladder tumor, acute renal failure  Discharge diagnosis: Bladder tumor, acute renal failure  Secondary diagnoses:  Patient Active Problem List   Diagnosis Date Noted  . Acute kidney failure ([place]) 12/11/2015  . Hydronephrosis   . Bladder mass 12/10/2015  . Abnormal ECG 04/21/2014  . Breathlessness on exertion 04/21/2014  . BP (high blood pressure) 04/21/2014    History and Physical: For full details, please see admission history and physical. Briefly, Tonya Moreno is a 82 y.o. year old patient with a bladder tumor presented for TURBT was found have outer hydronephrosis and acute renal failure.   Hospital Course: Patient tolerated the procedure well.  She was noted to be in acute renal failure. She had a precancerous nephrostomy tube placed on the left however her renal failure did not improve. Placement of a percutaneous nephrostomy tube on the left resulted in postoperative diuresis and normalization of her kidney function and electrolytes. Her electrolytes have now normalized. Urine output has a normal volume. She is ready for discharge home with her bilateral percutaneous nephrostomy tubes in place. She no longer has a Foley catheter.   Laboratory values:   Recent Labs  12/13/15 0506 12/14/15 0422  WBC 7.9 8.6  HGB 9.4* 9.9*  HCT 27.2* 29.5*    Recent Labs  12/13/15 0506 12/14/15 0422 12/14/15 1759 12/15/15 0525  NA 134* 138  --  139  K 3.2* 3.0* 3.5 3.4*  CL 93* 100*  --  103  CO2 30 29  --  33*  GLUCOSE 106* 107*  --  101*  BUN 60* 35*  --  21*  CREATININE 8.53* 2.77*  --  0.91  CALCIUM 8.3* 8.4*  --  8.7*   No results for input(s): LABPT, INR in the last 72 hours. No results for input(s): LABURIN in the last 72 hours. Results for orders placed or performed during the hospital encounter of 12/10/15  Surgical PCR screen     Status: None   Collection Time: 12/12/15  5:45 AM   Result Value Ref Range Status   MRSA, PCR NEGATIVE NEGATIVE Final   Staphylococcus aureus NEGATIVE NEGATIVE Final    Comment:        The Xpert SA Assay (FDA approved for NASAL specimens in patients over 16 years of age), is one component of a comprehensive surveillance program.  Test performance has been validated by [facility] for patients greater than or equal to 45 year old. It is not intended to diagnose infection nor to guide or monitor treatment.     Disposition: Home  Discharge instruction: The patient was instructed to be ambulatory but told to refrain from heavy lifting, strenuous activity, or driving.   Discharge medications:   Medication List    STOP taking these medications        cephALEXin 500 MG capsule  Commonly known as:  KEFLEX      TAKE these medications        amLODipine 10 MG tablet  Commonly known as:  NORVASC  Take 10 mg by mouth daily. After lunch     aspirin 81 MG tablet  Take 81 mg by mouth every morning.     Cholecalciferol 4000 units Caps  Take 4,000 Units by mouth daily.     Vitamin D3 50000 units Caps  Take 1 capsule by mouth once a week.     docusate sodium 100 MG capsule  Commonly known as:  COLACE  Take 2 capsules (200 mg total) by mouth 2 (two) times daily.     quinapril 20 MG tablet  Commonly known as:  ACCUPRIL  Take 20 mg by mouth 2 (two) times daily.        Followup:      Follow-up Information    Follow up with Hollice Espy, MD On 12/20/2015.   Specialty:  Urology   Why:  8:45 AM   Contact information:   [address] [place] [address] [phone]

## 2015-12-15 NOTE — Progress Notes (Addendum)
[facility] NAME: Tonya Moreno    MR#:  [id number]  DATE OF BIRTH:  Dec 03, 1933  SUBJECTIVE:  CHIEF COMPLAINT:  No chief complaint on file.  No complaint. S/p nephrostomy. Feels good today, anticipates to go home. Patient's urologist noted that patient's biopsy from the bladder done 13th of February revealed invasive carcinoma with squamous differentiation, concerning for cervical cancer extension.  REVIEW OF SYSTEMS:  CONSTITUTIONAL: No fever, fatigue or weakness.  EYES: No blurred or double vision.  EARS, NOSE, AND THROAT: No tinnitus or ear pain.  RESPIRATORY: No cough, shortness of breath, wheezing or hemoptysis.  CARDIOVASCULAR: No chest pain, orthopnea, edema.  GASTROINTESTINAL: No nausea, vomiting, diarrhea or abdominal pain.  GENITOURINARY: No dysuria, hematuria.  ENDOCRINE: No polyuria, nocturia, but has hematuria. HEMATOLOGY: No anemia, easy bruising or bleeding SKIN: No rash or lesion. MUSCULOSKELETAL: No joint pain or arthritis.   NEUROLOGIC: No tingling, numbness, weakness.  PSYCHIATRY: No anxiety or depression.   DRUG ALLERGIES:   Allergies  Allergen Reactions  . Hydralazine Palpitations    VITALS:  Blood pressure 134/66, pulse 62, temperature 97.8 F (36.6 C), temperature source Oral, resp. rate 16, height 5\' 5"  (1.651 m), weight 73.846 kg (162 lb 12.8 oz), SpO2 100 %.  PHYSICAL EXAMINATION:  GENERAL:  82 y.o.-year-old patient lying in the bed with no acute distress.  EYES: Pupils equal, round, reactive to light and accommodation. No scleral icterus. Extraocular muscles intact.  HEENT: Head atraumatic, normocephalic. Oropharynx and nasopharynx clear.  NECK:  Supple, no jugular venous distention. No thyroid enlargement, no tenderness.  LUNGS: Normal breath sounds bilaterally, no wheezing, rales,rhonchi or crepitation. No use of accessory muscles of respiration.  CARDIOVASCULAR: S1, S2 normal. No  murmurs, rubs, or gallops.  ABDOMEN: Soft, nontender, nondistended. Bowel sounds present. No organomegaly or mass. Bilateral nephrostomy tubes with pink urine, no clots, draining well. EXTREMITIES: No pedal edema, cyanosis, or clubbing.  NEUROLOGIC: Cranial nerves II through XII are intact. Muscle strength 5/5 in all extremities. Sensation intact. Gait not checked.  PSYCHIATRIC: The patient is alert and oriented x 3.  SKIN: No obvious rash, lesion, or ulcer.    LABORATORY PANEL:   CBC  Recent Labs Lab 12/14/15 0422  WBC 8.6  HGB 9.9*  HCT 29.5*  PLT 204   ------------------------------------------------------------------------------------------------------------------  Chemistries   Recent Labs Lab 12/15/15 0525  NA 139  K 3.4*  CL 103  CO2 33*  GLUCOSE 101*  BUN 21*  CREATININE 0.91  CALCIUM 8.7*  MG 1.8   ------------------------------------------------------------------------------------------------------------------  Cardiac Enzymes No results for input(s): TROPONINI in the last 168 hours. ------------------------------------------------------------------------------------------------------------------  RADIOLOGY:  [place] Intraoperative  12/13/2015  CLINICAL DATA:  Ultrasound was provided for use by the ordering physician, and a technical charge was applied by the performing facility.  No radiologist interpretation/professional services rendered.   Ir  Nephroureteral Cath Place Left  12/13/2015  CLINICAL DATA:  Bladder tumor and status post right-sided percutaneous nephrostomy tube placement on 12/10/2015. There remains oliguric renal failure and repeat renal ultrasound on 12/12/2015 revealed progressive left hydronephrosis and the patient now requires a left percutaneous nephrostomy tube. EXAM: 1. ULTRASOUND GUIDANCE FOR PUNCTURE OF THE LEFT RENAL COLLECTING SYSTEM. 2. LEFT PERCUTANEOUS NEPHROSTOMY TUBE PLACEMENT. 3. EXCHANGE OF RIGHT PERCUTANEOUS NEPHROSTOMY TUBE.  COMPARISON:  Imaging on 12/10/2015 ANESTHESIA/SEDATION: 2.5 mg IV Versed; 75 mcg IV Fentanyl. Total Moderate Sedation Time 30 minutes The patient's level of consciousness and physiologic status were continuously monitored during  the procedure by Radiology nursing. CONTRAST:  15 ml Omnipaque 300 MEDICATIONS: 400 mg IV Cipro. Ciprofloxacin was given within two hours of incision. FLUOROSCOPY TIME:  1 minute. PROCEDURE: The procedure, risks, benefits, and alternatives were explained to the patient. Questions regarding the procedure were encouraged and answered. The patient understands and consents to the procedure. A time-out was performed prior to the procedure. The left flank region was prepped with chlorhexidine in a sterile fashion, and a sterile drape was applied covering the operative field. A sterile gown and sterile gloves were used for the procedure. Local anesthesia was provided with 1% Lidocaine. Ultrasound was used to localize the left kidney. Under direct ultrasound guidance, a 21 gauge needle was advanced into the renal collecting system. Ultrasound image documentation was performed. Aspiration of urine sample was performed followed by contrast injection. A transitional dilator was advanced over a guidewire. Percutaneous tract dilatation was then performed over the guidewire. A 10 -French percutaneous nephrostomy tube was then advanced and formed in the collecting system. Catheter position was confirmed by fluoroscopy after contrast injection. The catheter was secured at the skin with a Prolene retention suture and Stat-Lock device. A gravity bag was placed. The pre-existing right nephrostomy tube was prepped with chlorhexidine. Contrast injection was performed under fluoroscopy. The catheter was then cut and removed over a guidewire. A new 10 French nephrostomy tube was then advanced into the collecting system over the guidewire. The tube was formed and connected to a gravity drainage bag. The catheter  was secured at the skin with a Prolene retention suture and StatLock device. COMPLICATIONS: None. FINDINGS: Ultrasound confirms persistent moderate left-sided hydronephrosis. After lower pole access, a 10 French nephrostomy tube was placed and formed in the renal pelvis. Urine return was blood-tinged after tube placement. It was noted that the previously placed 8 French right-sided nephrostomy to have retracted at the skin exit site at the level of a StatLock device. Fluoroscopy and contrast injection confirms retraction of the catheter into a lower pole calyx. The tube was exchanged and upsized for a 10 French nephrostomy tube which was formed in the central collecting system. The tube could not be completely advanced into the renal pelvis. IMPRESSION: 1. Placement of new 37 French left-sided nephrostomy tube to treat progressive hydronephrosis. The tube was formed in the renal pelvis and connected to a gravity drainage bag. 2. Exchange of partially retracted previously placed right-sided nephrostomy tube. A new 10 French nephrostomy tube was advanced into the central collecting system and attached to a gravity drainage bag. Electronically Signed   By: Aletta Edouard M.D.   On: 12/13/2015 17:29   Ir Nephrostomy Exchange Right  12/13/2015  CLINICAL DATA:  Bladder tumor and status post right-sided percutaneous nephrostomy tube placement on 12/10/2015. There remains oliguric renal failure and repeat renal ultrasound on 12/12/2015 revealed progressive left hydronephrosis and the patient now requires a left percutaneous nephrostomy tube. EXAM: 1. ULTRASOUND GUIDANCE FOR PUNCTURE OF THE LEFT RENAL COLLECTING SYSTEM. 2. LEFT PERCUTANEOUS NEPHROSTOMY TUBE PLACEMENT. 3. EXCHANGE OF RIGHT PERCUTANEOUS NEPHROSTOMY TUBE. COMPARISON:  Imaging on 12/10/2015 ANESTHESIA/SEDATION: 2.5 mg IV Versed; 75 mcg IV Fentanyl. Total Moderate Sedation Time 30 minutes The patient's level of consciousness and physiologic status were  continuously monitored during the procedure by Radiology nursing. CONTRAST:  15 ml Omnipaque 300 MEDICATIONS: 400 mg IV Cipro. Ciprofloxacin was given within two hours of incision. FLUOROSCOPY TIME:  1 minute. PROCEDURE: The procedure, risks, benefits, and alternatives were explained to the patient. Questions regarding the  procedure were encouraged and answered. The patient understands and consents to the procedure. A time-out was performed prior to the procedure. The left flank region was prepped with chlorhexidine in a sterile fashion, and a sterile drape was applied covering the operative field. A sterile gown and sterile gloves were used for the procedure. Local anesthesia was provided with 1% Lidocaine. Ultrasound was used to localize the left kidney. Under direct ultrasound guidance, a 21 gauge needle was advanced into the renal collecting system. Ultrasound image documentation was performed. Aspiration of urine sample was performed followed by contrast injection. A transitional dilator was advanced over a guidewire. Percutaneous tract dilatation was then performed over the guidewire. A 10 -French percutaneous nephrostomy tube was then advanced and formed in the collecting system. Catheter position was confirmed by fluoroscopy after contrast injection. The catheter was secured at the skin with a Prolene retention suture and Stat-Lock device. A gravity bag was placed. The pre-existing right nephrostomy tube was prepped with chlorhexidine. Contrast injection was performed under fluoroscopy. The catheter was then cut and removed over a guidewire. A new 10 French nephrostomy tube was then advanced into the collecting system over the guidewire. The tube was formed and connected to a gravity drainage bag. The catheter was secured at the skin with a Prolene retention suture and StatLock device. COMPLICATIONS: None. FINDINGS: Ultrasound confirms persistent moderate left-sided hydronephrosis. After lower pole access, a  10 French nephrostomy tube was placed and formed in the renal pelvis. Urine return was blood-tinged after tube placement. It was noted that the previously placed 8 French right-sided nephrostomy to have retracted at the skin exit site at the level of a StatLock device. Fluoroscopy and contrast injection confirms retraction of the catheter into a lower pole calyx. The tube was exchanged and upsized for a 10 French nephrostomy tube which was formed in the central collecting system. The tube could not be completely advanced into the renal pelvis. IMPRESSION: 1. Placement of new 73 French left-sided nephrostomy tube to treat progressive hydronephrosis. The tube was formed in the renal pelvis and connected to a gravity drainage bag. 2. Exchange of partially retracted previously placed right-sided nephrostomy tube. A new 10 French nephrostomy tube was advanced into the central collecting system and attached to a gravity drainage bag. Electronically Signed   By: Aletta Edouard M.D.   On: 12/13/2015 17:29    EKG:   Orders placed or performed during the hospital encounter of 11/01/15  . EKG 12-Lead  . EKG 12-Lead    ASSESSMENT AND PLAN:   1. Acute renal failure on chronic kidney disease stage III, Seems to have obstructive uropathy leading to ATN.  Hold quinapril.  Improving, Cr. Down to 2.77 from 8.53 after Bilateral nephrostomy tube placement.  F/u BMP.  * Acute renal failure with gross hematuria post obstructive etiology with bladder tumor and right-sided hydronephrosis and left-sided hydronephrosis Status post tumor resection and nephrostomy tube placement by Dr. Erlene Quan. S/p Left nephrostomy tube placement; right nephrostomy exchange by radiologist.  Urologist recommends to patient to be discharged home with follow-up with Dr. Erlene Quan as outpatient. Biopsy revealed squamous cell carcinoma, concerning for cervical cancer extension, biopsy results will be discussed by Dr. Erlene Quan as outpatient.  Patient's kidney function is back to normal.   2. Metabolic acidosis. Improved with bicarb iv, transition to NS iv.. Follow as outpatient  3. Hyponatremia. Resolved.  4. Essential hypertension. Resume  Amlodipine. Hold quinapril due to blood pressures still being on the soft side  .  5. Anemia of chronic disease. Stable.  * hypokalemia. KCl given. F/u BMP As outpatient  *Suspected cervical carcinoma with extension to the bladder, status post biopsy, February 13 by Dr. Erlene Quan, biopsy results will be discussed by Dr. Erlene Quan. Patient will likely need to follow up with gynecologist oncologist as outpatient  *Generalized weakness, however, now services will be arranged for the patient upon discharge, order is written, discussed with patient's son, who is agreeable  All the records are reviewed and case discussed with Care Management/Social Workerr. Management plans discussed with the patient, her daughter and they are in agreement.  CODE STATUS: full code.  TOTAL TIME TAKING CARE OF THIS PATIENT: 35 minutes.  Discussed with urologist  Regenia Erck M.D on 12/15/2015 at 2:17 PM  Between 7am to 6pm - Pager - [phone]  After 6pm go to [email or web address] - password [email or web address] [facility]  Office  [phone]  CC: Primary care physician; Letta Median, MD

## 2015-12-15 NOTE — Progress Notes (Signed)
MEDICATION RELATED CONSULT NOTE - Follow Up  Pharmacy Consult for electrolyte management Indication: hypokalemia  Allergies  Allergen Reactions  . Hydralazine Palpitations    Patient Measurements: Height: 5\' 5"  (165.1 cm) Weight: 162 lb 12.8 oz (73.846 kg) IBW/kg (Calculated) : 57  Vital Signs: Temp: 99.5 F (37.5 C) (02/18 0514) Temp Source: Oral (02/18 0514) BP: 126/58 mmHg (02/18 0514) Pulse Rate: 70 (02/18 0514) Intake/Output from previous day: 02/17 0701 - 02/18 0700 In: [id number] [P.O.:120; I.V.:1115] Out: 1480 [Urine:1480] Intake/Output from this shift:    Assessment: Pharmacy consulted to manage electrolytes in this 82 year old female with hypokalemia.  Current estimated CrCl is 50.4 ml/min.  K = 3.4, Mag = 1.8, Phos = 1.8.    Goal of Therapy:  Electrolytes within normal limits  Plan:  Ordered Potassium Phosphate 31mmol IV x one dose.   Will recheck K/Mg/Phos with AM labs tomorrow.  Olivia Canter, [facility] Clinical Pharmacist  12/15/2015, 8:12 AM

## 2015-12-15 NOTE — Progress Notes (Signed)
Pt had BM, IV was taken out. Pt and family were both instructed on how to drain nephrostomy tubes. Pt advised that if she has an increase in pain to call Dr. Erlene Quan. Pt and family given information on a Renal diet and advised to follow up with Dr. Juleen China in 2 weeks. All questions answered and pt was taken downstairs via wheelchair by Leda Gauze.

## 2015-12-15 NOTE — Care Management Note (Signed)
Case Management Note  Patient Details  Name: Tonya Moreno MRN: [id number] Date of Birth: [birth year]  Subjective/Objective:    Referral called to [facility] [place] per home health referral for PT, RN, Aid. Tonya Moreno will be staying at her daughters address after discharge.                 Action/Plan:   Expected Discharge Date:                  Expected Discharge Plan:     In-House Referral:     Discharge planning Services     Post Acute Care Choice:    Choice offered to:     DME Arranged:    DME Agency:     HH Arranged:    [place] Agency:     Status of Service:     Medicare Important Message Given:  Yes Date Medicare IM Given:    Medicare IM give by:    Date Additional Medicare IM Given:    Additional Medicare Important Message give by:     If discussed at [place] of Stay Meetings, dates discussed:    Additional Comments:  Sheleen Conchas A, RN 12/15/2015, 11:08 AM

## 2015-12-15 NOTE — Progress Notes (Signed)
Urology Follow Up  Subjective: No events overnight Cr normalized K stable UOP normalized C/o no BM since admission   Anti-infectives: Anti-infectives    Start     Dose/Rate Route Frequency Ordered Stop   12/13/15 1045  ciprofloxacin (CIPRO) IVPB 400 mg     400 mg 200 mL/hr over 60 Minutes Intravenous  Once 12/13/15 1037 12/13/15 1150   12/13/15 1040  ciprofloxacin (CIPRO) 400 MG/200ML IVPB    Comments:  Darlin Drop: cabinet override      12/13/15 1040 12/13/15 2244   12/10/15 1545  ceFAZolin (ANCEF) IVPB 1 g/50 mL premix     1 g 100 mL/hr over 30 Minutes Intravenous  Once 12/10/15 1541 12/10/15 1657   12/10/15 1245  ceFAZolin (ANCEF) IVPB 1 g/50 mL premix  Status:  Discontinued     1 g 100 mL/hr over 30 Minutes Intravenous Every 8 hours 12/10/15 1232 12/10/15 1337   12/10/15 0611  ceFAZolin (ANCEF) 2-3 GM-% IVPB SOLR    Comments:  Rexanne Mano: cabinet override      12/10/15 0611 12/10/15 1814   12/10/15 0600  ceFAZolin (ANCEF) IVPB 2 g/50 mL premix     2 g 100 mL/hr over 30 Minutes Intravenous 30 min pre-op 12/10/15 0600 12/10/15 0730      Current Facility-Administered Medications  Medication Dose Route Frequency Provider Last Rate Last Dose  . 0.9 % NaCl with KCl 20 mEq/ L  infusion   Intravenous Continuous Lavonia Dana, MD 50 mL/hr at 12/14/15 1148    . acetaminophen (TYLENOL) tablet 650 mg  650 mg Oral Q4H PRN Hollice Espy, MD      . bisacodyl (DULCOLAX) suppository 10 mg  10 mg Rectal Once Nickie Retort, MD      . cholecalciferol (VITAMIN D) tablet 4,000 Units  4,000 Units Oral Daily Hollice Espy, MD   4,000 Units at 12/15/15 0932  . diphenhydrAMINE (BENADRYL) injection 12.5 mg  12.5 mg Intravenous Q6H PRN Hollice Espy, MD       Or  . diphenhydrAMINE (BENADRYL) 12.5 MG/5ML elixir 12.5 mg  12.5 mg Oral Q6H PRN Hollice Espy, MD      . docusate sodium (COLACE) capsule 200 mg  200 mg Oral BID Hollice Espy, MD   200 mg at 12/15/15 0932  . fentaNYL  (SUBLIMAZE) injection   Intravenous PRN Aletta Edouard, MD   25 mcg at 12/13/15 1443  . lidocaine (PF) (XYLOCAINE) 1 % injection    PRN Aletta Edouard, MD   3 mL at 12/13/15 1435  . morphine 2 MG/ML injection 2-4 mg  2-4 mg Intravenous Q2H PRN Hollice Espy, MD      . ondansetron [facility]) injection 4 mg  4 mg Intravenous Q4H PRN Hollice Espy, MD   4 mg at 12/11/15 2215  . opium-belladonna (B&O SUPPRETTES) 16.2-60 MG suppository 1 suppository  1 suppository Rectal Q6H PRN Hollice Espy, MD      . oxybutynin (DITROPAN) tablet 5 mg  5 mg Oral Q8H PRN Hollice Espy, MD      . oxyCODONE (Oxy IR/ROXICODONE) immediate release tablet 5 mg  5 mg Oral Q4H PRN Hollice Espy, MD      . pneumococcal 23 valent vaccine (PNU-IMMUNE) injection 0.5 mL  0.5 mL Intramuscular Tomorrow-1000 Hollice Espy, MD   0.5 mL at 12/12/15 1000  . potassium phosphate 15 mmol in dextrose 5 % 250 mL infusion  15 mmol Intravenous Once Hollice Espy, MD   15 mmol at 12/15/15 0932  . sodium  chloride flush (NS) 0.9 % injection 10 mL  10 mL Intracatheter BID Aletta Edouard, MD   10 mL at 12/15/15 0934  . sodium chloride flush (NS) 0.9 % injection 10 mL  10 mL Intracatheter BID Aletta Edouard, MD   10 mL at 12/15/15 0934  . Vitamin D3 CAPS 50,000 Units  50,000 Units Oral Weekly Hollice Espy, MD         Objective: Vital signs in last 24 hours: Temp:  [98.2 F (36.8 C)-99.5 F (37.5 C)] 99.5 F (37.5 C) (02/18 0514) Pulse Rate:  [66-70] 70 (02/18 0514) Resp:  [16-18] 16 (02/18 0514) BP: (119-126)/(53-59) 126/58 mmHg (02/18 0514) SpO2:  [98 %-100 %] 98 % (02/18 0514)  Intake/Output from previous day: 02/17 0701 - 02/18 0700 In: [id number] [P.O.:120; I.V.:1115] Out: 1480 [Urine:1480]  Break down 3350 right nephrostomy, Foley 100  Intake/Output this shift: Total I/O In: 20 [Other:20] Out: -    Physical Exam  Constitutional: She is oriented to person, place, and time and well-developed, well-nourished, and in no  distress.  HENT:  Head: Normocephalic and atraumatic.  Neck: Normal range of motion.  Abdominal: Soft.  Genitourinary:  No foley. Nephrostomy tubes draining clear urine blt  Musculoskeletal: Normal range of motion.  Neurological: She is alert and oriented to person, place, and time.  Skin: Skin is warm and dry.  Psychiatric: Affect and judgment normal.    Lab Results:   Recent Labs  12/13/15 0506 12/14/15 0422  WBC 7.9 8.6  HGB 9.4* 9.9*  HCT 27.2* 29.5*  PLT 188 204   BMET  Recent Labs  12/14/15 0422 12/14/15 1759 12/15/15 0525  NA 138  --  139  K 3.0* 3.5 3.4*  CL 100*  --  103  CO2 29  --  33*  GLUCOSE 107*  --  101*  BUN 35*  --  21*  CREATININE 2.77*  --  0.91  CALCIUM 8.4*  --  8.7*   PT/INR No results for input(s): LABPROT, INR in the last 72 hours. Studies/Results: [place] Intraoperative  12/13/2015  CLINICAL DATA:  Ultrasound was provided for use by the ordering physician, and a technical charge was applied by the performing facility.  No radiologist interpretation/professional services rendered.   Ir  Nephroureteral Cath Place Left  12/13/2015  CLINICAL DATA:  Bladder tumor and status post right-sided percutaneous nephrostomy tube placement on 12/10/2015. There remains oliguric renal failure and repeat renal ultrasound on 12/12/2015 revealed progressive left hydronephrosis and the patient now requires a left percutaneous nephrostomy tube. EXAM: 1. ULTRASOUND GUIDANCE FOR PUNCTURE OF THE LEFT RENAL COLLECTING SYSTEM. 2. LEFT PERCUTANEOUS NEPHROSTOMY TUBE PLACEMENT. 3. EXCHANGE OF RIGHT PERCUTANEOUS NEPHROSTOMY TUBE. COMPARISON:  Imaging on 12/10/2015 ANESTHESIA/SEDATION: 2.5 mg IV Versed; 75 mcg IV Fentanyl. Total Moderate Sedation Time 30 minutes The patient's level of consciousness and physiologic status were continuously monitored during the procedure by Radiology nursing. CONTRAST:  15 ml Omnipaque 300 MEDICATIONS: 400 mg IV Cipro. Ciprofloxacin was given within  two hours of incision. FLUOROSCOPY TIME:  1 minute. PROCEDURE: The procedure, risks, benefits, and alternatives were explained to the patient. Questions regarding the procedure were encouraged and answered. The patient understands and consents to the procedure. A time-out was performed prior to the procedure. The left flank region was prepped with chlorhexidine in a sterile fashion, and a sterile drape was applied covering the operative field. A sterile gown and sterile gloves were used for the procedure. Local anesthesia was provided with 1% Lidocaine. Ultrasound was  used to localize the left kidney. Under direct ultrasound guidance, a 21 gauge needle was advanced into the renal collecting system. Ultrasound image documentation was performed. Aspiration of urine sample was performed followed by contrast injection. A transitional dilator was advanced over a guidewire. Percutaneous tract dilatation was then performed over the guidewire. A 10 -French percutaneous nephrostomy tube was then advanced and formed in the collecting system. Catheter position was confirmed by fluoroscopy after contrast injection. The catheter was secured at the skin with a Prolene retention suture and Stat-Lock device. A gravity bag was placed. The pre-existing right nephrostomy tube was prepped with chlorhexidine. Contrast injection was performed under fluoroscopy. The catheter was then cut and removed over a guidewire. A new 10 French nephrostomy tube was then advanced into the collecting system over the guidewire. The tube was formed and connected to a gravity drainage bag. The catheter was secured at the skin with a Prolene retention suture and StatLock device. COMPLICATIONS: None. FINDINGS: Ultrasound confirms persistent moderate left-sided hydronephrosis. After lower pole access, a 10 French nephrostomy tube was placed and formed in the renal pelvis. Urine return was blood-tinged after tube placement. It was noted that the previously  placed 8 French right-sided nephrostomy to have retracted at the skin exit site at the level of a StatLock device. Fluoroscopy and contrast injection confirms retraction of the catheter into a lower pole calyx. The tube was exchanged and upsized for a 10 French nephrostomy tube which was formed in the central collecting system. The tube could not be completely advanced into the renal pelvis. IMPRESSION: 1. Placement of new 82 French left-sided nephrostomy tube to treat progressive hydronephrosis. The tube was formed in the renal pelvis and connected to a gravity drainage bag. 2. Exchange of partially retracted previously placed right-sided nephrostomy tube. A new 10 French nephrostomy tube was advanced into the central collecting system and attached to a gravity drainage bag. Electronically Signed   By: Aletta Edouard M.D.   On: 12/13/2015 17:29   Ir Nephrostomy Exchange Right  12/13/2015  CLINICAL DATA:  Bladder tumor and status post right-sided percutaneous nephrostomy tube placement on 12/10/2015. There remains oliguric renal failure and repeat renal ultrasound on 12/12/2015 revealed progressive left hydronephrosis and the patient now requires a left percutaneous nephrostomy tube. EXAM: 1. ULTRASOUND GUIDANCE FOR PUNCTURE OF THE LEFT RENAL COLLECTING SYSTEM. 2. LEFT PERCUTANEOUS NEPHROSTOMY TUBE PLACEMENT. 3. EXCHANGE OF RIGHT PERCUTANEOUS NEPHROSTOMY TUBE. COMPARISON:  Imaging on 12/10/2015 ANESTHESIA/SEDATION: 2.5 mg IV Versed; 75 mcg IV Fentanyl. Total Moderate Sedation Time 30 minutes The patient's level of consciousness and physiologic status were continuously monitored during the procedure by Radiology nursing. CONTRAST:  15 ml Omnipaque 300 MEDICATIONS: 400 mg IV Cipro. Ciprofloxacin was given within two hours of incision. FLUOROSCOPY TIME:  1 minute. PROCEDURE: The procedure, risks, benefits, and alternatives were explained to the patient. Questions regarding the procedure were encouraged and  answered. The patient understands and consents to the procedure. A time-out was performed prior to the procedure. The left flank region was prepped with chlorhexidine in a sterile fashion, and a sterile drape was applied covering the operative field. A sterile gown and sterile gloves were used for the procedure. Local anesthesia was provided with 1% Lidocaine. Ultrasound was used to localize the left kidney. Under direct ultrasound guidance, a 21 gauge needle was advanced into the renal collecting system. Ultrasound image documentation was performed. Aspiration of urine sample was performed followed by contrast injection. A transitional dilator was advanced over a guidewire.  Percutaneous tract dilatation was then performed over the guidewire. A 10 -French percutaneous nephrostomy tube was then advanced and formed in the collecting system. Catheter position was confirmed by fluoroscopy after contrast injection. The catheter was secured at the skin with a Prolene retention suture and Stat-Lock device. A gravity bag was placed. The pre-existing right nephrostomy tube was prepped with chlorhexidine. Contrast injection was performed under fluoroscopy. The catheter was then cut and removed over a guidewire. A new 10 French nephrostomy tube was then advanced into the collecting system over the guidewire. The tube was formed and connected to a gravity drainage bag. The catheter was secured at the skin with a Prolene retention suture and StatLock device. COMPLICATIONS: None. FINDINGS: Ultrasound confirms persistent moderate left-sided hydronephrosis. After lower pole access, a 10 French nephrostomy tube was placed and formed in the renal pelvis. Urine return was blood-tinged after tube placement. It was noted that the previously placed 8 French right-sided nephrostomy to have retracted at the skin exit site at the level of a StatLock device. Fluoroscopy and contrast injection confirms retraction of the catheter into a lower  pole calyx. The tube was exchanged and upsized for a 10 French nephrostomy tube which was formed in the central collecting system. The tube could not be completely advanced into the renal pelvis. IMPRESSION: 1. Placement of new 73 French left-sided nephrostomy tube to treat progressive hydronephrosis. The tube was formed in the renal pelvis and connected to a gravity drainage bag. 2. Exchange of partially retracted previously placed right-sided nephrostomy tube. A new 10 French nephrostomy tube was advanced into the central collecting system and attached to a gravity drainage bag. Electronically Signed   By: Aletta Edouard M.D.   On: 12/13/2015 17:29    Assessment: POD 4 s/p Procedure(s): CYSTOSCOPY WITH RETROGRADE PYELOGRAM TRANSURETHRAL RESECTION OF BLADDER TUMOR (TURBT) EXAM UNDER ANESTHESIA  1. Acute on chronic renal failure/ metabolic acidosis- s/p bilateral perc tubes, Cr normalized, K stabalized, UOP volume normalized.   2. Bladder mass- patient to follow up with Dr. Erlene Quan for pathology results next week  3. Anemia- dilution/ acute blood loss anemia/ chronic diease.  Stable.   4. Hyponatremia: Resolved   Plan: Ducolax supp now for BM D/c home today with blt percs in place F/u next week with Dr. Erlene Quan for pathology   LOS: 4 days    [place] 12/15/2015

## 2015-12-15 NOTE — Progress Notes (Signed)
[facility]  ROUNDING NOTE   Subjective:   Creatinine now back to baseline. Plan for discharge later today.   Objective:  Vital signs in last 24 hours:  Temp:  [98.2 F (36.8 C)-99.5 F (37.5 C)] 99.5 F (37.5 C) (02/18 0514) Pulse Rate:  [66-70] 70 (02/18 0514) Resp:  [16-18] 16 (02/18 0514) BP: (119-126)/(53-59) 126/58 mmHg (02/18 0514) SpO2:  [98 %-100 %] 98 % (02/18 0514)  Weight change:  Filed Weights   12/10/15 1525  Weight: 73.846 kg (162 lb 12.8 oz)    Intake/Output: I/O last 3 completed shifts: In: 2130 [P.O.:360; I.V.:1710; Other:60] Out: 1191 [[id number]]   Intake/Output this shift:  Total I/O In: 260 [P.O.:240; Other:20] Out: -   Physical Exam: General: NAD, laying in bed  Head: Normocephalic, atraumatic. Moist oral mucosal membranes  Eyes: Anicteric, PERRL  Neck: Supple, trachea midline  Lungs:  Clear to auscultation  Heart: Regular rate and rhythm  Abdomen:  Right nephrostomy tube, Left nephrostomy tube  Extremities:  no edema.  Neurologic: Nonfocal, moving all four extremities  Skin: No lesions  GU: Right nephrostomy with clear urine, red urine in foley, left nephrostomy with red urine    Basic Metabolic Panel:  Recent Labs Lab 12/11/15 0556 12/12/15 0510 12/13/15 0506 12/14/15 0422 12/14/15 1759 12/15/15 0525  NA 125* 129* 134* 138  --  139  K 4.3 3.7 3.2* 3.0* 3.5 3.4*  CL 86* 93* 93* 100*  --  103  CO2 20* 24 30 29   --  33*  GLUCOSE 160* 125* 106* 107*  --  101*  BUN 63* 60* 60* 35*  --  21*  CREATININE 8.97* 8.82* 8.53* 2.77*  --  0.91  CALCIUM 8.2* 8.2* 8.3* 8.4*  --  8.7*  MG  --   --  1.9 1.8  --  1.8  PHOS  --   --   --   --   --  1.8*  1.8*    Liver Function Tests:  Recent Labs Lab 12/15/15 0525  ALBUMIN 2.4*   No results for input(s): LIPASE, AMYLASE in the last 168 hours. No results for input(s): AMMONIA in the last 168 hours.  CBC:  Recent Labs Lab 12/10/15 1120 12/11/15 0556 12/12/15 0510  12/13/15 0506 12/14/15 0422  WBC 6.1 9.6 10.1 7.9 8.6  HGB 11.1* 9.7* 9.8* 9.4* 9.9*  HCT 32.9* 28.7* 28.4* 27.2* 29.5*  MCV 86.9 85.9 84.5 86.0 87.7  PLT 231 242 223 188 204    Cardiac Enzymes: No results for input(s): CKTOTAL, CKMB, CKMBINDEX, TROPONINI in the last 168 hours.  BNP: Invalid input(s): POCBNP  CBG: No results for input(s): GLUCAP in the last 168 hours.  Microbiology: Results for orders placed or performed during the hospital encounter of 12/10/15  Surgical PCR screen     Status: None   Collection Time: 12/12/15  5:45 AM  Result Value Ref Range Status   MRSA, PCR NEGATIVE NEGATIVE Final   Staphylococcus aureus NEGATIVE NEGATIVE Final    Comment:        The Xpert SA Assay (FDA approved for NASAL specimens in patients over 87 years of age), is one component of a comprehensive surveillance program.  Test performance has been validated by [facility] for patients greater than or equal to 34 year old. It is not intended to diagnose infection nor to guide or monitor treatment.     Coagulation Studies: No results for input(s): LABPROT, INR in the last 72 hours.  Urinalysis:  No results for input(s): COLORURINE, LABSPEC, [place], GLUCOSEU, HGBUR, BILIRUBINUR, KETONESUR, PROTEINUR, UROBILINOGEN, NITRITE, LEUKOCYTESUR in the last 72 hours.  Invalid input(s): APPERANCEUR    Imaging: [place] Intraoperative  12/13/2015  CLINICAL DATA:  Ultrasound was provided for use by the ordering physician, and a technical charge was applied by the performing facility.  No radiologist interpretation/professional services rendered.   Ir  Nephroureteral Cath Place Left  12/13/2015  CLINICAL DATA:  Bladder tumor and status post right-sided percutaneous nephrostomy tube placement on 12/10/2015. There remains oliguric renal failure and repeat renal ultrasound on 12/12/2015 revealed progressive left hydronephrosis and the patient now requires a left percutaneous nephrostomy tube. EXAM:  1. ULTRASOUND GUIDANCE FOR PUNCTURE OF THE LEFT RENAL COLLECTING SYSTEM. 2. LEFT PERCUTANEOUS NEPHROSTOMY TUBE PLACEMENT. 3. EXCHANGE OF RIGHT PERCUTANEOUS NEPHROSTOMY TUBE. COMPARISON:  Imaging on 12/10/2015 ANESTHESIA/SEDATION: 2.5 mg IV Versed; 75 mcg IV Fentanyl. Total Moderate Sedation Time 30 minutes The patient's level of consciousness and physiologic status were continuously monitored during the procedure by Radiology nursing. CONTRAST:  15 ml Omnipaque 300 MEDICATIONS: 400 mg IV Cipro. Ciprofloxacin was given within two hours of incision. FLUOROSCOPY TIME:  1 minute. PROCEDURE: The procedure, risks, benefits, and alternatives were explained to the patient. Questions regarding the procedure were encouraged and answered. The patient understands and consents to the procedure. A time-out was performed prior to the procedure. The left flank region was prepped with chlorhexidine in a sterile fashion, and a sterile drape was applied covering the operative field. A sterile gown and sterile gloves were used for the procedure. Local anesthesia was provided with 1% Lidocaine. Ultrasound was used to localize the left kidney. Under direct ultrasound guidance, a 21 gauge needle was advanced into the renal collecting system. Ultrasound image documentation was performed. Aspiration of urine sample was performed followed by contrast injection. A transitional dilator was advanced over a guidewire. Percutaneous tract dilatation was then performed over the guidewire. A 10 -French percutaneous nephrostomy tube was then advanced and formed in the collecting system. Catheter position was confirmed by fluoroscopy after contrast injection. The catheter was secured at the skin with a Prolene retention suture and Stat-Lock device. A gravity bag was placed. The pre-existing right nephrostomy tube was prepped with chlorhexidine. Contrast injection was performed under fluoroscopy. The catheter was then cut and removed over a guidewire.  A new 10 French nephrostomy tube was then advanced into the collecting system over the guidewire. The tube was formed and connected to a gravity drainage bag. The catheter was secured at the skin with a Prolene retention suture and StatLock device. COMPLICATIONS: None. FINDINGS: Ultrasound confirms persistent moderate left-sided hydronephrosis. After lower pole access, a 10 French nephrostomy tube was placed and formed in the renal pelvis. Urine return was blood-tinged after tube placement. It was noted that the previously placed 8 French right-sided nephrostomy to have retracted at the skin exit site at the level of a StatLock device. Fluoroscopy and contrast injection confirms retraction of the catheter into a lower pole calyx. The tube was exchanged and upsized for a 10 French nephrostomy tube which was formed in the central collecting system. The tube could not be completely advanced into the renal pelvis. IMPRESSION: 1. Placement of new 36 French left-sided nephrostomy tube to treat progressive hydronephrosis. The tube was formed in the renal pelvis and connected to a gravity drainage bag. 2. Exchange of partially retracted previously placed right-sided nephrostomy tube. A new 10 French nephrostomy tube was advanced into the central collecting system and attached  to a gravity drainage bag. Electronically Signed   By: Aletta Edouard M.D.   On: 12/13/2015 17:29   Ir Nephrostomy Exchange Right  12/13/2015  CLINICAL DATA:  Bladder tumor and status post right-sided percutaneous nephrostomy tube placement on 12/10/2015. There remains oliguric renal failure and repeat renal ultrasound on 12/12/2015 revealed progressive left hydronephrosis and the patient now requires a left percutaneous nephrostomy tube. EXAM: 1. ULTRASOUND GUIDANCE FOR PUNCTURE OF THE LEFT RENAL COLLECTING SYSTEM. 2. LEFT PERCUTANEOUS NEPHROSTOMY TUBE PLACEMENT. 3. EXCHANGE OF RIGHT PERCUTANEOUS NEPHROSTOMY TUBE. COMPARISON:  Imaging on  12/10/2015 ANESTHESIA/SEDATION: 2.5 mg IV Versed; 75 mcg IV Fentanyl. Total Moderate Sedation Time 30 minutes The patient's level of consciousness and physiologic status were continuously monitored during the procedure by Radiology nursing. CONTRAST:  15 ml Omnipaque 300 MEDICATIONS: 400 mg IV Cipro. Ciprofloxacin was given within two hours of incision. FLUOROSCOPY TIME:  1 minute. PROCEDURE: The procedure, risks, benefits, and alternatives were explained to the patient. Questions regarding the procedure were encouraged and answered. The patient understands and consents to the procedure. A time-out was performed prior to the procedure. The left flank region was prepped with chlorhexidine in a sterile fashion, and a sterile drape was applied covering the operative field. A sterile gown and sterile gloves were used for the procedure. Local anesthesia was provided with 1% Lidocaine. Ultrasound was used to localize the left kidney. Under direct ultrasound guidance, a 21 gauge needle was advanced into the renal collecting system. Ultrasound image documentation was performed. Aspiration of urine sample was performed followed by contrast injection. A transitional dilator was advanced over a guidewire. Percutaneous tract dilatation was then performed over the guidewire. A 10 -French percutaneous nephrostomy tube was then advanced and formed in the collecting system. Catheter position was confirmed by fluoroscopy after contrast injection. The catheter was secured at the skin with a Prolene retention suture and Stat-Lock device. A gravity bag was placed. The pre-existing right nephrostomy tube was prepped with chlorhexidine. Contrast injection was performed under fluoroscopy. The catheter was then cut and removed over a guidewire. A new 10 French nephrostomy tube was then advanced into the collecting system over the guidewire. The tube was formed and connected to a gravity drainage bag. The catheter was secured at the skin  with a Prolene retention suture and StatLock device. COMPLICATIONS: None. FINDINGS: Ultrasound confirms persistent moderate left-sided hydronephrosis. After lower pole access, a 10 French nephrostomy tube was placed and formed in the renal pelvis. Urine return was blood-tinged after tube placement. It was noted that the previously placed 8 French right-sided nephrostomy to have retracted at the skin exit site at the level of a StatLock device. Fluoroscopy and contrast injection confirms retraction of the catheter into a lower pole calyx. The tube was exchanged and upsized for a 10 French nephrostomy tube which was formed in the central collecting system. The tube could not be completely advanced into the renal pelvis. IMPRESSION: 1. Placement of new 67 French left-sided nephrostomy tube to treat progressive hydronephrosis. The tube was formed in the renal pelvis and connected to a gravity drainage bag. 2. Exchange of partially retracted previously placed right-sided nephrostomy tube. A new 10 French nephrostomy tube was advanced into the central collecting system and attached to a gravity drainage bag. Electronically Signed   By: Aletta Edouard M.D.   On: 12/13/2015 17:29     Medications:   . 0.9 % NaCl with KCl 20 mEq / L 50 mL/hr at 12/14/15 1148   .  cholecalciferol  4,000 Units Oral Daily  . docusate sodium  200 mg Oral BID  . pneumococcal 23 valent vaccine  0.5 mL Intramuscular Tomorrow-1000  . potassium phosphate IVPB (mmol)  15 mmol Intravenous Once  . sodium chloride flush  10 mL Intracatheter BID  . sodium chloride flush  10 mL Intracatheter BID  . Vitamin D3  50,000 Units Oral Weekly   acetaminophen, diphenhydrAMINE **OR** diphenhydrAMINE, fentaNYL, lidocaine (PF), morphine injection, ondansetron, opium-belladonna, oxybutynin, oxyCODONE  Assessment/ Plan:  Tonya Moreno is a 82 y.o. black female  with hypertension, who was admitted to [facility] on 12/10/2015 for gross hematuria,right  hydronephrosis.  1. Acute renal failure on chronic kidney disease stage III: Baseline creatinine of 1.14 09/19/2015 with eGFR of 52.  Nonoliguric urine output. Seems to have obstructive uropathy leading to ATN. Urine output and electrolytes are improving.  - no acute indication for dialysis at this time. Monitor urine output, volume status, electrolytes and renal function.  - Hold quinapril.   2. Hypokalemia: with post obstructive diuresis: potassium 3.4  3. Hyponatremia: with recent cystoscopy. Improved   4. Anemia: hemoglobin 9.9. With gross hematuria.   5. Hypertension: blood pressure at goal.  - continue amlodipine.    Will have patient follow up with Nephrology in 2 weeks.   LOS: Benedict, Ravinia 2/18/201710:23 AM

## 2015-12-20 ENCOUNTER — Encounter: Payer: Self-pay | Admitting: Urology

## 2015-12-20 ENCOUNTER — Ambulatory Visit (INDEPENDENT_AMBULATORY_CARE_PROVIDER_SITE_OTHER): Payer: Medicare Other | Admitting: Urology

## 2015-12-20 VITALS — BP 126/72 | HR 70 | Ht 63.0 in | Wt 163.9 lb

## 2015-12-20 DIAGNOSIS — N19 Unspecified kidney failure: Secondary | ICD-10-CM | POA: Diagnosis not present

## 2015-12-20 DIAGNOSIS — C539 Malignant neoplasm of cervix uteri, unspecified: Secondary | ICD-10-CM

## 2015-12-20 NOTE — Patient Instructions (Signed)
You have locally advanced cervical cancer involving the bladder and ureters.    You will need to get a PET/ CT scan in the near future to look for metastatic diease.    You will be referred to GYN/ONC (Dr. Fransisca Connors or Dr. Theora Gianotti) for further treatment which may involve radiation and chemotherapy.    I will continue to managed your nephrostomy tubes.  You will need to return in 2 weeks for me to arrange the first exchange which happens over at the hospital.  Hollice Espy, MD

## 2015-12-20 NOTE — Progress Notes (Signed)
12/20/2015 2:42 PM   Tonya Moreno 1934/06/02 [id number]  Referring provider: Letta Median, MD [place], [place] [phone]  Chief Complaint  Patient presents with  . Results    path results post op    HPI: 82 year old female initially referred for incidental right hydronephrosis and gross hematuria found to have an infiltrating bladder mass obstructing the trigone.  She was taken to the operating room on 10/08/2016 at which time she underwent transurethral resection of her bladder tumor.  Bimanual exam in the OR also revealed a fixed tumor involving the anterior vaginal wall.   In the PACU, she is relatively anuric and stat labs revealed that she was in florid renal failure.  She had a somewhat prolonged hospital admission ultimately requiring bilateral percutaneous nephrostomy tubes at which time her creatinine began to normalize.  She returns to the office today to discuss her surgical pathology results.  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  No issues with her nephrostomy tubes which are draining well. She currently has physical therapy and home nursing. She is staying with her daughter postoperatively.  PMH: Past Medical History  Diagnosis Date  . Hypertension   . Shortness of breath dyspnea     doe  . Arthritis   . Complication of anesthesia     tonsillectomy age 28 bad experience with mask and hard to wake up  . Enlarged kidney   . Hernia of abdominal wall 12/04/2015    left side  . Acute kidney failure ([place]) 12/11/2015    Surgical History: Past Surgical History  Procedure Laterality Date  . Tonsillectomy    . Cystoscopy w/ retrogrades Left 12/10/2015    Procedure: CYSTOSCOPY WITH RETROGRADE PYELOGRAM;  Surgeon: Hollice Espy, MD;  Location: [facility] ORS;  Service: Urology;  Laterality: Left;  . Transurethral resection  of bladder tumor N/A 12/10/2015    Procedure: TRANSURETHRAL RESECTION OF BLADDER TUMOR (TURBT);  Surgeon: Hollice Espy, MD;  Location: [facility] ORS;  Service: Urology;  Laterality: N/A;    Home Medications:    Medication List       This list is accurate as of: 12/20/15  2:42 PM.  Always use your most recent med list.               amLODipine 10 MG tablet  Commonly known as:  NORVASC  Take 10 mg by mouth daily. After lunch     aspirin 81 MG tablet  Take 81 mg by mouth every morning.     Cholecalciferol 4000 units Caps  Take 4,000 Units by mouth daily.     Vitamin D3 50000 units Caps  Take 1 capsule by mouth once a week.     docusate sodium 100 MG capsule  Commonly known as:  COLACE  Take 2 capsules (200 mg total) by mouth 2 (two) times daily.     quinapril 20 MG tablet  Commonly known as:  ACCUPRIL  Take 20 mg by mouth 2 (two) times daily.        Allergies:  Allergies  Allergen Reactions  . Hydralazine Palpitations    Family History: Family History  Problem Relation Age of Onset  . Cancer Mother   . Prostate cancer Neg Hx   . Bladder Cancer Neg Hx   . Kidney cancer Neg Hx     Social History:  reports that she has never smoked. She has never used smokeless tobacco. She  reports that she does not drink alcohol or use illicit drugs.  ROS: UROLOGY Frequent Urination?: No Hard to postpone urination?: No Burning/pain with urination?: No Get up at night to urinate?: No Leakage of urine?: Yes Urine stream starts and stops?: No Trouble starting stream?: No Do you have to strain to urinate?: No Blood in urine?: Yes Urinary tract infection?: No Sexually transmitted disease?: No Injury to kidneys or bladder?: No Painful intercourse?: No Weak stream?: No Currently pregnant?: No Vaginal bleeding?: No Last menstrual period?: n  Gastrointestinal Nausea?: No Vomiting?: No Indigestion/heartburn?: No Diarrhea?: No Constipation?: No  Constitutional Fever:  No Night sweats?: No Weight loss?: No Fatigue?: Yes  Skin Skin rash/lesions?: No Itching?: No  Eyes Blurred vision?: No Double vision?: No  Ears/Nose/Throat Sore throat?: No Sinus problems?: No  Hematologic/Lymphatic Swollen glands?: No Easy bruising?: No  Cardiovascular Leg swelling?: No Chest pain?: No  Respiratory Cough?: No Shortness of breath?: No  Endocrine Excessive thirst?: No  Musculoskeletal Back pain?: No Joint pain?: No  Neurological Headaches?: No Dizziness?: No  Psychologic Depression?: No Anxiety?: No  Physical Exam: BP 126/72 mmHg  Pulse 70  Ht 5\' 3"  (1.6 m)  Wt 163 lb 14.4 oz (74.345 kg)  BMI 29.04 kg/m2  Constitutional:  Alert and oriented, No acute distress.  Accompanied by her two daughters today. HEENT: [place] AT, moist mucus membranes.  Trachea midline, no masses. Cardiovascular: No clubbing, cyanosis, or edema. Respiratory: Normal respiratory effort, no increased work of breathing. GI: Abdomen is soft, nontender, nondistended, no abdominal masses GU: Bilateral nephrostomy tube sites clean dry and intact, draining clear yellow urine. Skin: No rashes, bruises or suspicious lesions. Neurologic: Grossly intact, no focal deficits, moving all 4 extremities. Psychiatric: Quiet, somewhat flat today  Laboratory Data: Lab Results  Component Value Date   WBC 8.6 12/14/2015   HGB 9.9* 12/14/2015   HCT 29.5* 12/14/2015   MCV 87.7 12/14/2015   PLT 204 12/14/2015    Lab Results  Component Value Date   CREATININE 0.91 12/15/2015     Assessment & Plan:    1. Malignant neoplasm of cervix, unspecified site [facility]) Pathology reviewed today with the patient and her family.  We discussed that this is T4 disease but need to perform further imaging studies to assess for evidence of possible metastatic disease. Discussed CT PET scan and referral to GYN/ONC. She will likely need XRT/ chemo. - Basic metabolic panel - Ambulatory referral to  Gynecologic Oncology - NM PET Image Initial (PI) Skull Base To Thigh  2. Renal failure Secondary to obstructive nephropathy, creatinine back to baseline with bilateral percutaneous nephrostomy tubes. Recheck BMP today given issues with potassium prior to discharge. We will continue to manage her nephrostomy tubes, will need exchange in 3-4 weeks. Plan for follow up in 2 weeks to help arrange this.   Return in about 2 weeks (around 01/03/2016) for arrange for nephrostomy tube exchange.  Hollice Espy, MD  [place] [address] [place], [place] [address] [phone]  I spent 30 min with this patient of which greater than 50% was spent in counseling and coordination of care with the patient.  All of her questions were answered. Case was reviewed at tumor board. She was also briefly discussed with Dr. Fransisca Connors and GYN/ GU navigator.

## 2015-12-21 ENCOUNTER — Ambulatory Visit
Admission: RE | Admit: 2015-12-21 | Discharge: 2015-12-21 | Disposition: A | Discharge status: Home / Self Care | Payer: Medicare Other | Source: Ambulatory Visit | Attending: Urology | Admitting: Urology

## 2015-12-21 DIAGNOSIS — J90 Pleural effusion, not elsewhere classified: Secondary | ICD-10-CM | POA: Diagnosis not present

## 2015-12-21 DIAGNOSIS — C787 Secondary malignant neoplasm of liver and intrahepatic bile duct: Secondary | ICD-10-CM | POA: Diagnosis not present

## 2015-12-21 DIAGNOSIS — Z936 Other artificial openings of urinary tract status: Secondary | ICD-10-CM | POA: Diagnosis not present

## 2015-12-21 DIAGNOSIS — C539 Malignant neoplasm of cervix uteri, unspecified: Secondary | ICD-10-CM | POA: Diagnosis not present

## 2015-12-21 LAB — BASIC METABOLIC PANEL
BUN / CREAT RATIO: 10 — AB (ref 11–26)
BUN: 9 mg/dL (ref 8–27)
CALCIUM: 10.1 mg/dL (ref 8.7–10.3)
CO2: 24 mmol/L (ref 18–29)
CREATININE: 0.91 mg/dL (ref 0.57–1.00)
Chloride: 96 mmol/L (ref 96–106)
GFR calc Af Amer: 69 mL/min/{1.73_m2} (ref >59)
GFR calc non Af Amer: 60 mL/min/{1.73_m2} (ref >59)
Glucose: 101 mg/dL — ABNORMAL HIGH (ref 65–99)
Potassium: 3.8 mmol/L (ref 3.5–5.2)
Sodium: 138 mmol/L (ref 134–144)

## 2015-12-21 LAB — GLUCOSE, CAPILLARY: GLUCOSE-CAPILLARY: 82 mg/dL (ref 65–99)

## 2015-12-21 MED ORDER — FLUDEOXYGLUCOSE F - 18 (FDG) INJECTION
10.9000 | Freq: Once | INTRAVENOUS | Status: AC | PRN
  Administered 2015-12-21: 10.9 via INTRAVENOUS

## 2015-12-24 ENCOUNTER — Telehealth: Payer: Self-pay

## 2015-12-24 NOTE — Telephone Encounter (Signed)
  Oncology Nurse Navigator Documentation  Navigator Location: [facility] (12/24/15 1500)                                            Time Spent with Patient: 15 (12/24/15 1500)   Called and spoke with daughter Wells Guiles to confirm appt Wed 3/1 at 1400

## 2015-12-26 ENCOUNTER — Inpatient Hospital Stay: Payer: Medicare Other | Attending: Obstetrics and Gynecology | Admitting: Obstetrics and Gynecology

## 2015-12-26 ENCOUNTER — Encounter: Payer: Self-pay | Admitting: Obstetrics and Gynecology

## 2015-12-26 ENCOUNTER — Encounter (INDEPENDENT_AMBULATORY_CARE_PROVIDER_SITE_OTHER): Payer: Self-pay

## 2015-12-26 VITALS — BP 115/69 | HR 80 | Temp 98.7°F | Ht 63.0 in | Wt 137.1 lb

## 2015-12-26 DIAGNOSIS — R590 Localized enlarged lymph nodes: Secondary | ICD-10-CM | POA: Diagnosis not present

## 2015-12-26 DIAGNOSIS — C787 Secondary malignant neoplasm of liver and intrahepatic bile duct: Secondary | ICD-10-CM | POA: Insufficient documentation

## 2015-12-26 DIAGNOSIS — Z5111 Encounter for antineoplastic chemotherapy: Secondary | ICD-10-CM | POA: Insufficient documentation

## 2015-12-26 DIAGNOSIS — Z7982 Long term (current) use of aspirin: Secondary | ICD-10-CM

## 2015-12-26 DIAGNOSIS — R54 Age-related physical debility: Secondary | ICD-10-CM | POA: Diagnosis not present

## 2015-12-26 DIAGNOSIS — C539 Malignant neoplasm of cervix uteri, unspecified: Secondary | ICD-10-CM

## 2015-12-26 DIAGNOSIS — J90 Pleural effusion, not elsewhere classified: Secondary | ICD-10-CM | POA: Insufficient documentation

## 2015-12-26 DIAGNOSIS — N820 Vesicovaginal fistula: Secondary | ICD-10-CM | POA: Diagnosis not present

## 2015-12-26 DIAGNOSIS — D259 Leiomyoma of uterus, unspecified: Secondary | ICD-10-CM | POA: Insufficient documentation

## 2015-12-26 DIAGNOSIS — N1330 Unspecified hydronephrosis: Secondary | ICD-10-CM

## 2015-12-26 DIAGNOSIS — J432 Centrilobular emphysema: Secondary | ICD-10-CM | POA: Insufficient documentation

## 2015-12-26 DIAGNOSIS — I517 Cardiomegaly: Secondary | ICD-10-CM | POA: Insufficient documentation

## 2015-12-26 DIAGNOSIS — R630 Anorexia: Secondary | ICD-10-CM | POA: Insufficient documentation

## 2015-12-26 DIAGNOSIS — N179 Acute kidney failure, unspecified: Secondary | ICD-10-CM | POA: Insufficient documentation

## 2015-12-26 DIAGNOSIS — Z79899 Other long term (current) drug therapy: Secondary | ICD-10-CM

## 2015-12-26 DIAGNOSIS — R002 Palpitations: Secondary | ICD-10-CM

## 2015-12-26 DIAGNOSIS — M1611 Unilateral primary osteoarthritis, right hip: Secondary | ICD-10-CM | POA: Insufficient documentation

## 2015-12-26 DIAGNOSIS — Z809 Family history of malignant neoplasm, unspecified: Secondary | ICD-10-CM | POA: Insufficient documentation

## 2015-12-26 DIAGNOSIS — M1990 Unspecified osteoarthritis, unspecified site: Secondary | ICD-10-CM

## 2015-12-26 DIAGNOSIS — R0600 Dyspnea, unspecified: Secondary | ICD-10-CM

## 2015-12-26 DIAGNOSIS — I10 Essential (primary) hypertension: Secondary | ICD-10-CM

## 2015-12-26 DIAGNOSIS — Z780 Asymptomatic menopausal state: Secondary | ICD-10-CM

## 2015-12-26 NOTE — Progress Notes (Signed)
Gynecologic Oncology Consult Visit   Referring Provider: Dr. 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 G30P4 female who is seen in consultation from Dr. 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   She presented to Dr. Erlene Quan for incidental right hydronephrosis and gross hematuria found to have an infiltrating bladder mass obstructing the trigone. She was taken to the operating room on 10/08/2016 at which time she underwent transurethral resection of her bladder tumor. Bimanual exam in the OR also revealed a fixed tumor involving the anterior vaginal wall. In the PACU, she is relatively anuric and stat labs revealed that she was in florid renal failure. She had a somewhat prolonged hospital admission ultimately requiring bilateral percutaneous nephrostomy tubes at which time her creatinine began to normalize.  PATHOLOGY DIAGNOSIS:  A. BLADDER TUMOR, RIGHT TRIGONE; TURBT:  - INVASIVE CARCINOMA WITH SQUAMOUS DIFFERENTIATION, SEE COMMENT.   Comment:  The neoplastic cells are negative for GATA-3; stain controls worked  appropriately. Given the lack of staining with this urothelial marker  and the abnormal bimanual examination a primary cervical carcinoma is  favored. These findings were communicated to Tonya Moreno in Dr.  Cherrie Gauze office on 12/14/2015.   She presents today. She has no significant complaints of vaginal bleeding. Her last Pap was years ago and she does not think she has a h/o abnormal Pap smears.   Problem List: Patient Active Problem List   Diagnosis Date Noted  . Acute kidney failure ([place]) 12/11/2015  . Hydronephrosis   . Bladder mass 12/10/2015  . Abnormal ECG 04/21/2014  . Breathlessness on exertion 04/21/2014  . BP (high blood pressure) 04/21/2014    Past Medical History: Past Medical History  Diagnosis Date  .  Hypertension   . Shortness of breath dyspnea     doe  . Arthritis   . Complication of anesthesia     tonsillectomy age 43 bad experience with mask and hard to wake up  . Enlarged kidney   . Hernia of abdominal wall 12/04/2015    left side  . Acute kidney failure ([place]) 12/11/2015    Past Surgical History: Past Surgical History  Procedure Laterality Date  . Tonsillectomy    . Cystoscopy w/ retrogrades Left 12/10/2015    Procedure: CYSTOSCOPY WITH RETROGRADE PYELOGRAM;  Surgeon: Hollice Espy, MD;  Location: [facility] ORS;  Service: Urology;  Laterality: Left;  . Transurethral resection of bladder tumor N/A 12/10/2015    Procedure: TRANSURETHRAL RESECTION OF BLADDER TUMOR (TURBT);  Surgeon: Hollice Espy, MD;  Location: [facility] ORS;  Service: Urology;  Laterality: N/A;    Past Gynecologic History:  Menarche: Unknown Menstrual details: postmenopausal Menses regular: N/A History of Abnormal pap: as per HPI   OB History:  OB History  Gravida Para Term Preterm AB SAB TAB Ectopic Multiple Living  5 4   1          # Outcome Date GA Lbr Len/2nd Weight Sex Delivery Anes PTL Lv  5 AB           4 Para           3 Para           2 Para           1 Para               Family History: Family History  Problem Relation Age of Onset  .  Cancer Mother   . Prostate cancer Neg Hx   . Bladder Cancer Neg Hx   . Kidney cancer Neg Hx     Social History: Social History   Social History  . Marital Status: Single    Spouse Name: N/A  . Number of Children: N/A  . Years of Education: N/A   Occupational History  . Not on file.   Social History Main Topics  . Smoking status: Never Smoker   . Smokeless tobacco: Never Used  . Alcohol Use: No  . Drug Use: No  . Sexual Activity: Not on file   Other Topics Concern  . Not on file   Social History Narrative    Allergies: Allergies  Allergen Reactions  . Hydralazine Palpitations    Current Medications: Current Outpatient Prescriptions   Medication Sig Dispense Refill  . amLODipine (NORVASC) 10 MG tablet Take 10 mg by mouth daily. After lunch    . aspirin 81 MG tablet Take 81 mg by mouth every morning.     . Cholecalciferol 4000 UNITS CAPS Take 4,000 Units by mouth daily.    Marland Kitchen docusate sodium (COLACE) 100 MG capsule Take 2 capsules (200 mg total) by mouth 2 (two) times daily. 120 capsule 0  . quinapril (ACCUPRIL) 20 MG tablet Take 20 mg by mouth 2 (two) times daily.    . Cholecalciferol (VITAMIN D3) 50000 units CAPS Take 1 capsule by mouth once a week.     No current facility-administered medications for this visit.    Review of Systems General: fatigue and weakness Skin: negative Eyes: positive for contacts/glasses HEENT: negative Pulmonary: negative except for dyspnea  Cardiac: negative except for palpitations Gastrointestinal: negative Genitourinary/Sexual: negative Ob/Gyn: no vaginal bleeding Musculoskeletal: positive for joint pain Hematology: negative for bleeding Neurologic/Psych: negative  Objective:  Physical Examination:  BP 115/69 mmHg  Pulse 80  Temp(Src) 98.7 F (37.1 C) (Oral)  Ht 5\' 3"  (1.6 m)  Wt 137 lb 2 oz (62.2 kg)  BMI 24.30 kg/m2   ECOG Performance Status: 2 - Symptomatic, <50% confined to bed; walking with a cane.   General appearance: alert, cooperative and appears stated age HEENT:PERRLA, extra ocular movement intact and sclera clear, anicteric Lymph node survey: non-palpable, axillary, supraclavicular; inguinal shotty adenopathy.  Cardiovascular: regular rate and rhythm Respiratory: normal air entry, lungs clear to auscultation Breast exam: breasts appear normal, no suspicious masses, no skin or nipple changes or axillary nodes. Abdomen: obvious 6 cm ventral wall hernia, easily reducible; soft, non-tender, without masses or organomegaly, no hernias or ascites Back: inspection of back is normal; PCN dressings in place and nontender.  Extremities: extremities normal, atraumatic,  no cyanosis or edema Skin exam - normal coloration and turgor, no rashes, no suspicious skin lesions noted. Neurological exam reveals alert, oriented, normal speech, no focal findings or movement disorder noted.  Pelvic: exam chaperoned by nurse;  Vulva: normal appearing vulva with no masses, tenderness or lesions; Vagina: grossly abnormal with tumor infiltration. Cervix; not visualized appears to be completely involved with tumor.   BME:  Uterus: unable to determine size but not grossly enlarged; large cervical mass with complete infiltration of the anterior vaginal wall and the upper 1/3 of the posterior vaginal wall. There is disease involving the parametria bilaterally, but the disease does not extend to the side wall.  Rectal: confirmatory, nodular disease if palpable in the cul-de-sac and anterior rectal peritoneal reflection.    Lab Review Lab Results  Component Value Date   WBC 8.6  12/14/2015   HGB 9.9* 12/14/2015   HCT 29.5* 12/14/2015   MCV 87.7 12/14/2015   PLT 204 12/14/2015     Chemistry      Component Value Date/Time   NA 138 12/20/2015 0933   NA 139 12/15/2015 0525   K 3.8 12/20/2015 0933   CL 96 12/20/2015 0933   CO2 24 12/20/2015 0933   BUN 9 12/20/2015 0933   BUN 21* 12/15/2015 0525   CREATININE 0.91 12/20/2015 0933      Component Value Date/Time   CALCIUM 10.1 12/20/2015 0933   ALKPHOS 86 12/04/2015 1820   AST 20 12/04/2015 1820   ALT 12* 12/04/2015 1820   BILITOT 0.7 12/04/2015 1820       Radiologic Imaging: PET reviewed personally : no radiology final read. We contacted radiology regarding a formal read.     Assessment:  CARLEI ADDERLEY is a 82 y.o. female diagnosed with at least stage IV A locally advanced cervical cancer with  vesicovaginal fistula s/p PCN.    Medical co-morbidities complicating care: frailty and anemia.  Plan:   Problem List Items Addressed This Visit    None    Visit Diagnoses    Cervical cancer, FIGO stage IVA ([place])     -  Primary    Relevant Orders    Ambulatory referral to Radiation Oncology    Ambulatory referral to Oncology       We discussed options for management. Based on findings , we recommend concurrent chemotherapy and radiation for disease control. I have referred to Medical and Radiation Oncology. She will see Dr. Christel Mormon at [facility] for radiation treatment recommendations given the extensive nature of her disease. She will then see Dr. Baruch Gouty and Dr. Oliva Bustard to proceed with therapy.   I provided precautions regarding bleeding.   The patient's diagnosis, an outline of the further diagnostic and laboratory studies which will be required, the recommendation, and alternatives were discussed.  All questions were answered to the patient's satisfaction.  A total of 60 minutes were spent with the patient/family today; 50% was spent in education, counseling and coordination of care for cervical cancer.    Gillis Ends, MD    CC:   Dr. Hollice Espy

## 2015-12-26 NOTE — Patient Instructions (Signed)
Cervical Cancer The cervix is the opening and bottom part of the uterus between the vagina and the uterus. Cervical cancer is a fairly common cancer. It occurs most often in women between the ages of 40 years and 55 years. Cells of the cervix act very much like skin cells. These cells are exposed to toxins, viruses, and bacteria that may cause abnormal changes.  There are two kinds of cancers of the cervix:   Squamous cell carcinoma. This type of cancer starts in the flat or scale-like cells that line the cervix. Squamous cell carcinoma can develop from a sexually transmitted infection caused by the human papillomavirus (HPV).  Adenocarcinoma. This type of cervical cancer starts in glandular cells that line the cervix. RISK FACTORS The risk of getting cancer of the cervix is related to your lifestyle, sexual history, health, and immune system. Risks for cervical cancer include:   Having a sexually transmitted viral infection. These include:  Chlamydia.   Herpes.   HPV.  Becoming sexually active before age 18 years.  Having more than one sexual partner or having sex with someone who has more than one sexual partner.  Not using condoms with sexual partners.  Having had cancer of the vagina or vulva.  Having a sexual partner who has or had cancer of the penis or who has had a sexual partner with abnormal cervical cells (dysplasia) or cervical cancer.  Using oral contraceptives (also called birth control pills).  Smoking.   Having a weakened immune system. For example, human immunodeficiency virus (HIV) or other immune deficiency disorders.  Being the daughter of a woman who took diethylstilbestrol (DES) during pregnancy.  Having a sister or mother who has had cancer of the cervix.  Being African American, Hispanic, Asian, or a woman from the [place].  A history of dysplasia of the cervix. SIGNS AND SYMPTOMS  Symptoms are usually not present in the early stages of  cervical cancer. Once the cancer invades the cervix and surrounding tissues, the woman may have:   Abnormal vaginal bleeding or menstrual bleeding that is longer or heavier than usual.  Bleeding after intercourse, douching, or a Pap test.  Vaginal bleeding following menopause.  Abnormal vaginal discharge.  Pelvic discomfort or pain.  An abnormal Pap test.  Pain during sexual intercourse. Symptoms of more advanced cervical cancer may include:   Loss of appetite or weight loss.  Tiredness (fatigue).  Back and leg pain.  Inability to control urination or bowel movements. DIAGNOSIS  A pelvic exam and Pap test are done to diagnose the condition. If abnormalities are found during the exam or Pap test, the Pap test may be repeated in 3 months, or your health care provider may do additional tests or procedures, such as:   A colposcopy. This is a procedure that uses a special microscope that allows the health care provider to magnify and closely examine the cells of the cervix, vagina, and vulva.  Cervical biopsies. This is a procedure where small tissue samples are taken from the cervix to be examined under a microscope by a specialist.   A cone biopsy. This is a procedure to test for or remove cancerous tissue. Other tests may be needed, including:   Cystoscopy.   Proctoscopy or sigmoidoscopy.  Ultrasound.   CT scan.   MRI.   Laparoscopy.  There are different stages of cervical cancer:   Stage 0, carcinoma in situ (CIS)--This first stage of cancer is the last and most serious stage of   dysplasia.  Stage I--This means the tumor is in the uterus and cervix only.  Stage II--This means the tumor has spread to the upper vagina. The cancer has spread beyond the uterus but not to the pelvic walls or lower third of the vagina.  Stage III--This means the tumor has invaded the side wall of the pelvis and the lower third of the vagina. If the tumor blocks the tubes that  carry urine to the bladder (ureters), it may cause urine to back up and the kidneys to swell (hydronephrosis).  Stage IV--This means the tumor has spread to the rectum or bladder. In the later part of this stage, it has also spread to distant organs, like the lungs. TREATMENT  Treatment options can include:   Cone biopsy to remove the cancerous tissue.   Removal of the entire uterus and cervix.   Removal of the uterus, cervix, upper vagina, lymph nodes, and surrounding tissue (modified radical hysterectomy). The ovaries may be left in place or removed.  Medicines to treat cancer.   A combination of surgery, radiation, and chemotherapy.   Biological response modifiers. These are substances that help strengthen your immune system's fight against cancer or infection. They may be used in combination with chemotherapy. HOME CARE INSTRUCTIONS   Get a gynecology exam and Pap test once every year or as directed by your health care provider.   Get the HPV vaccine.   Do not smoke.  Do not have sexual intercourse until your health care provider says it is okay.  Use a condom every time you have sex. SEEK MEDICAL CARE IF:   You have increased pelvic pain or pressure.   Your are becoming increasingly tired.   You have increased leg or back pain.   You have a fever.  You have abnormal bleeding or discharge.  You lose weight. SEEK IMMEDIATE MEDICAL CARE IF:   You cannot urinate.  You have blood in your urine.   You have blood or pressure with a bowel movement.   You develop severe back, stomach, or pelvic pain.   This information is not intended to replace advice given to you by your health care provider. Make sure you discuss any questions you have with your health care provider.   Document Released: 10/13/2005 Document Revised: 10/18/2013 Document Reviewed: 04/06/2013 Elsevier Interactive Patient Education 2016 [employer]  

## 2015-12-27 ENCOUNTER — Telehealth: Payer: Self-pay

## 2015-12-31 ENCOUNTER — Inpatient Hospital Stay (HOSPITAL_BASED_OUTPATIENT_CLINIC_OR_DEPARTMENT_OTHER): Payer: Medicare Other | Admitting: Oncology

## 2015-12-31 ENCOUNTER — Encounter: Payer: Self-pay | Admitting: Oncology

## 2015-12-31 ENCOUNTER — Other Ambulatory Visit: Payer: Self-pay | Admitting: Oncology

## 2015-12-31 ENCOUNTER — Encounter: Payer: Self-pay | Admitting: Radiation Oncology

## 2015-12-31 ENCOUNTER — Ambulatory Visit
Admission: RE | Admit: 2015-12-31 | Discharge: 2015-12-31 | Disposition: A | Discharge status: Home / Self Care | Payer: Medicare Other | Source: Ambulatory Visit | Attending: Radiation Oncology | Admitting: Radiation Oncology

## 2015-12-31 ENCOUNTER — Inpatient Hospital Stay: Payer: Medicare Other

## 2015-12-31 VITALS — BP 146/77 | HR 90 | Temp 95.7°F | Resp 18 | Wt 133.8 lb

## 2015-12-31 DIAGNOSIS — D259 Leiomyoma of uterus, unspecified: Secondary | ICD-10-CM

## 2015-12-31 DIAGNOSIS — I517 Cardiomegaly: Secondary | ICD-10-CM | POA: Diagnosis not present

## 2015-12-31 DIAGNOSIS — Z809 Family history of malignant neoplasm, unspecified: Secondary | ICD-10-CM | POA: Insufficient documentation

## 2015-12-31 DIAGNOSIS — Z5111 Encounter for antineoplastic chemotherapy: Secondary | ICD-10-CM | POA: Diagnosis not present

## 2015-12-31 DIAGNOSIS — C787 Secondary malignant neoplasm of liver and intrahepatic bile duct: Secondary | ICD-10-CM | POA: Insufficient documentation

## 2015-12-31 DIAGNOSIS — R54 Age-related physical debility: Secondary | ICD-10-CM

## 2015-12-31 DIAGNOSIS — I10 Essential (primary) hypertension: Secondary | ICD-10-CM

## 2015-12-31 DIAGNOSIS — R002 Palpitations: Secondary | ICD-10-CM

## 2015-12-31 DIAGNOSIS — N820 Vesicovaginal fistula: Secondary | ICD-10-CM | POA: Diagnosis not present

## 2015-12-31 DIAGNOSIS — Z7982 Long term (current) use of aspirin: Secondary | ICD-10-CM

## 2015-12-31 DIAGNOSIS — Z780 Asymptomatic menopausal state: Secondary | ICD-10-CM

## 2015-12-31 DIAGNOSIS — J432 Centrilobular emphysema: Secondary | ICD-10-CM

## 2015-12-31 DIAGNOSIS — N179 Acute kidney failure, unspecified: Secondary | ICD-10-CM | POA: Diagnosis not present

## 2015-12-31 DIAGNOSIS — Z79899 Other long term (current) drug therapy: Secondary | ICD-10-CM

## 2015-12-31 DIAGNOSIS — C7989 Secondary malignant neoplasm of other specified sites: Secondary | ICD-10-CM | POA: Insufficient documentation

## 2015-12-31 DIAGNOSIS — R0600 Dyspnea, unspecified: Secondary | ICD-10-CM | POA: Diagnosis not present

## 2015-12-31 DIAGNOSIS — C539 Malignant neoplasm of cervix uteri, unspecified: Secondary | ICD-10-CM | POA: Diagnosis not present

## 2015-12-31 DIAGNOSIS — R590 Localized enlarged lymph nodes: Secondary | ICD-10-CM

## 2015-12-31 DIAGNOSIS — M129 Arthropathy, unspecified: Secondary | ICD-10-CM | POA: Insufficient documentation

## 2015-12-31 DIAGNOSIS — J90 Pleural effusion, not elsewhere classified: Secondary | ICD-10-CM

## 2015-12-31 DIAGNOSIS — K469 Unspecified abdominal hernia without obstruction or gangrene: Secondary | ICD-10-CM | POA: Insufficient documentation

## 2015-12-31 DIAGNOSIS — Z510 Encounter for antineoplastic radiation therapy: Secondary | ICD-10-CM | POA: Insufficient documentation

## 2015-12-31 DIAGNOSIS — M1611 Unilateral primary osteoarthritis, right hip: Secondary | ICD-10-CM | POA: Diagnosis not present

## 2015-12-31 DIAGNOSIS — N1330 Unspecified hydronephrosis: Secondary | ICD-10-CM | POA: Diagnosis not present

## 2015-12-31 DIAGNOSIS — R630 Anorexia: Secondary | ICD-10-CM | POA: Diagnosis not present

## 2015-12-31 DIAGNOSIS — R0602 Shortness of breath: Secondary | ICD-10-CM | POA: Insufficient documentation

## 2015-12-31 HISTORY — DX: Malignant neoplasm of cervix uteri, unspecified: C53.9

## 2015-12-31 LAB — COMPREHENSIVE METABOLIC PANEL
ALT: 9 U/L — ABNORMAL LOW (ref 14–54)
AST: 22 U/L (ref 15–41)
Albumin: 3.4 g/dL — ABNORMAL LOW (ref 3.5–5.0)
Alkaline Phosphatase: 112 U/L (ref 38–126)
Anion gap: 8 (ref 5–15)
BUN: 14 mg/dL (ref 6–20)
CHLORIDE: 96 mmol/L — AB (ref 101–111)
CO2: 27 mmol/L (ref 22–32)
Calcium: 9.6 mg/dL (ref 8.9–10.3)
Creatinine, Ser: 1.12 mg/dL — ABNORMAL HIGH (ref 0.44–1.00)
GFR, EST AFRICAN AMERICAN: 53 mL/min — AB (ref >60)
GFR, EST NON AFRICAN AMERICAN: 45 mL/min — AB (ref >60)
Glucose, Bld: 106 mg/dL — ABNORMAL HIGH (ref 65–99)
POTASSIUM: 3.1 mmol/L — AB (ref 3.5–5.1)
Sodium: 131 mmol/L — ABNORMAL LOW (ref 135–145)
TOTAL PROTEIN: 7.4 g/dL (ref 6.5–8.1)
Total Bilirubin: 0.4 mg/dL (ref 0.3–1.2)

## 2015-12-31 LAB — CBC WITH DIFFERENTIAL/PLATELET
BASOS ABS: 0.1 10*3/uL (ref 0–0.1)
Basophils Relative: 1 %
EOS PCT: 2 %
Eosinophils Absolute: 0.1 10*3/uL (ref 0–0.7)
HCT: 31.1 % — ABNORMAL LOW (ref 35.0–47.0)
Hemoglobin: 10.4 g/dL — ABNORMAL LOW (ref 12.0–16.0)
LYMPHS ABS: 1.2 10*3/uL (ref 1.0–3.6)
LYMPHS PCT: 22 %
MCH: 29.1 pg (ref 26.0–34.0)
MCHC: 33.4 g/dL (ref 32.0–36.0)
MCV: 87.2 fL (ref 80.0–100.0)
MONO ABS: 0.6 10*3/uL (ref 0.2–0.9)
MONOS PCT: 10 %
Neutro Abs: 3.5 10*3/uL (ref 1.4–6.5)
Neutrophils Relative %: 65 %
PLATELETS: 284 10*3/uL (ref 150–440)
RBC: 3.57 MIL/uL — ABNORMAL LOW (ref 3.80–5.20)
RDW: 14.2 % (ref 11.5–14.5)
WBC: 5.4 10*3/uL (ref 3.6–11.0)

## 2015-12-31 LAB — MAGNESIUM: MAGNESIUM: 2.1 mg/dL (ref 1.7–2.4)

## 2015-12-31 MED ORDER — DEXAMETHASONE 4 MG PO TABS: ORAL_TABLET | ORAL | Status: DC

## 2015-12-31 MED ORDER — ONDANSETRON HCL 4 MG PO TABS: 4.0000 mg | ORAL_TABLET | Freq: Four times a day (QID) | ORAL | Status: DC | PRN

## 2015-12-31 NOTE — Consult Note (Signed)
Except an outstanding is perfect of Radiation Oncology NEW PATIENT EVALUATION  Name: Tonya Moreno  MRN: [id number]  Date:   12/31/2015     DOB: 12/28/1933   This 82 y.o. female patient presents to the clinic for initial evaluation of stage IV cervical cancer with liver metastasis and large pelvic mass  REFERRING PHYSICIAN: Letta Median, MD  CHIEF COMPLAINT: No chief complaint on file.   DIAGNOSIS: The encounter diagnosis was Malignant neoplasm of cervix, unspecified site [facility]).   PREVIOUS INVESTIGATIONS:  PET CT scan and CT scans reviewed Pathology report reviewed Clinical notes reviewed  HPI: Patient is a 82 year old female who presented with lower abdominal discomfort and vaginal bleeding. She was found to have bilateral hydronephrosis found on CT scan to have a large pelvic mass biopsy positive for invasive squamous cell carcinoma. PET CT scan demonstrated amorphous hypermetabolic pelvic soft tissue mass with new hepatic metastasis. She's been seen by medical oncology and recommendation for systemic chemotherapy using platinum and Taxol along with radiation therapy was made. She has been seen by radiation oncology at [facility] and recognition for palliative radiation was forwarded. She is seen today for my recommendations. She is doing fairly well she's having real bleeding no abdominal pain at this time which is interesting. She continues to bleed uses several pads a day.  PLANNED TREATMENT REGIMEN: Palliative pelvic radiation with concurrent chemotherapy  PAST MEDICAL HISTORY:  has a past medical history of Hypertension; Shortness of breath dyspnea; Arthritis; Complication of anesthesia; Enlarged kidney; Hernia of abdominal wall (12/04/2015); Acute kidney failure ([place]) (12/11/2015); and Carcinoma of cervix, stage 4 ([place]) (12/31/2015).    PAST SURGICAL HISTORY:  Past Surgical History  Procedure Laterality Date  . Tonsillectomy    . Cystoscopy w/ retrogrades Left 12/10/2015   Procedure: CYSTOSCOPY WITH RETROGRADE PYELOGRAM;  Surgeon: Hollice Espy, MD;  Location: [facility] ORS;  Service: Urology;  Laterality: Left;  . Transurethral resection of bladder tumor N/A 12/10/2015    Procedure: TRANSURETHRAL RESECTION OF BLADDER TUMOR (TURBT);  Surgeon: Hollice Espy, MD;  Location: [facility] ORS;  Service: Urology;  Laterality: N/A;    FAMILY HISTORY: family history includes Cancer in her mother. There is no history of Prostate cancer, Bladder Cancer, or Kidney cancer.  SOCIAL HISTORY:  reports that she has never smoked. She has never used smokeless tobacco. She reports that she does not drink alcohol or use illicit drugs.  ALLERGIES: Hydralazine  MEDICATIONS:  Current Outpatient Prescriptions  Medication Sig Dispense Refill  . amLODipine (NORVASC) 10 MG tablet Take 10 mg by mouth daily. After lunch    . aspirin 81 MG tablet Take 81 mg by mouth every morning.     . Cholecalciferol (VITAMIN D3) 50000 units CAPS Take 1 capsule by mouth once a week.    . Cholecalciferol 4000 UNITS CAPS Take 4,000 Units by mouth daily.    Marland Kitchen dexamethasone (DECADRON) 4 MG tablet Take 2 tablets by mouth the night before chemotherapy only 12 tablet 0  . docusate sodium (COLACE) 100 MG capsule Take 2 capsules (200 mg total) by mouth 2 (two) times daily. 120 capsule 0  . docusate sodium (RA COL-RITE) 100 MG capsule Take by mouth.    . ondansetron (ZOFRAN) 4 MG tablet Take 1 tablet (4 mg total) by mouth every 6 (six) hours as needed. 30 tablet 3  . quinapril (ACCUPRIL) 20 MG tablet Take 20 mg by mouth 2 (two) times daily.     No current facility-administered medications for this encounter.  ECOG PERFORMANCE STATUS:  1 - Symptomatic but completely ambulatory  REVIEW OF SYSTEMS: Except for vaginal bleeding and fatigue Patient denies any weight loss, fatigue, weakness, fever, chills or night sweats. Patient denies any loss of vision, blurred vision. Patient denies any ringing  of the ears or hearing  loss. No irregular heartbeat. Patient denies heart murmur or history of fainting. Patient denies any chest pain or pain radiating to her upper extremities. Patient denies any shortness of breath, difficulty breathing at night, cough or hemoptysis. Patient denies any swelling in the lower legs. Patient denies any nausea vomiting, vomiting of blood, or coffee ground material in the vomitus. Patient denies any stomach pain. Patient states has had normal bowel movements no significant constipation or diarrhea. Patient denies any dysuria, hematuria or significant nocturia. Patient denies any problems walking, swelling in the joints or loss of balance. Patient denies any skin changes, loss of hair or loss of weight. Patient denies any excessive worrying or anxiety or significant depression. Patient denies any problems with insomnia. Patient denies excessive thirst, polyuria, polydipsia. Patient denies any swollen glands, patient denies easy bruising or easy bleeding. Patient denies any recent infections, allergies or URI. Patient "s visual fields have not changed significantly in recent time.    PHYSICAL EXAM: There were no vitals taken for this visit. Well-developed elderly female in NAD.Well-developed well-nourished patient in NAD. HEENT reveals PERLA, EOMI, discs not visualized.  Oral cavity is clear. No oral mucosal lesions are identified. Neck is clear without evidence of cervical or supraclavicular adenopathy. Lungs are clear to A&P. Cardiac examination is essentially unremarkable with regular rate and rhythm without murmur rub or thrill. Abdomen is benign with no organomegaly or masses noted. Motor sensory and DTR levels are equal and symmetric in the upper and lower extremities. Cranial nerves II through XII are grossly intact. Proprioception is intact. No peripheral adenopathy or edema is identified. No motor or sensory levels are noted. Crude visual fields are within normal range. Based on her recent  multiple pelvic exams and bleeding pelvic exam was deferred today  LABORATORY DATA: Pathology reports reviewed    RADIOLOGY RESULTS: CT scans and PET/CT scans reviewed   IMPRESSION: Locally advanced cervical cancer with liver metastasis in 82 year old female  PLAN: At this time I have recommended palliative radiation therapy to her whole pelvis. Would plan on delivering 4500 cGy over [redacted] weeks along with concurrent chemotherapy. May make further recommendations depending on treatment response at that time. Risks and benefits of treatment including skin reaction fatigue possible diarrhea possible irritation of the bladder were all reviewed in detail with the patient and her family. She does have bilateral nephrostomy tubes at this point. Family and patient seem to compress my treatment plan well. I have set up and ordered CT simulation for tomorrow. Case was discussed personally with medical oncology.  I would like to take this opportunity for allowing me to participate in the care of your patient.Armstead Peaks., MD

## 2015-12-31 NOTE — Patient Instructions (Signed)

## 2015-12-31 NOTE — Progress Notes (Signed)
[place] @ [facility] [phone]  Fax:([phone]) Corazon: March 31, 1934  MR#: [id number]  [id number]  Patient Care Team: Letta Median, MD as PCP - General Clent Jacks, RN as Registered Nurse  CHIEF COMPLAINT:  Chief Complaint  Patient presents with  . 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  Diagnosis by cystoscopy in March of 2017 2.  Hydronephrosis was rising serum creatinine status post bilateral percutaneous nephrostomy drainage (March of 2017)  VISIT DIAGNOSIS:     ICD-9-CM ICD-10-CM   1. Malignant neoplasm of cervix, unspecified site (HCC) 180.9 C53.9 docusate sodium (RA COL-RITE) 100 MG capsule     CBC with Differential     Comprehensive metabolic panel     Magnesium  2. Carcinoma of cervix, stage 4 (HCC) 180.9 C53.9       No history exists.    Oncology Flowsheet 12/10/2015 12/11/2015  dexamethasone (DECADRON) IV - -  ondansetron (ZOFRAN) IV - 4 mg    INTERVAL HISTORY:  82 year old African-American lady very poor historian started having lower abdominal discomfort bleeding from vagina or urine not sure at that point in time further evaluation revealed hydronephrosis cystoscopy revealed a bladder mass which on biopsy.  Revealed invasive carcinoma with squamous differentiation. He is and underwent PET scan which revealed metastases to the liver.  Postoperatively patient's creatinine has increased so patient ended up with bilateral nephrostomy tube following that creatinine has been improving.  Patient denies any pain does not have any bleeding has a poor appetite has lost some weight.  Here for further follow-up and treatment consideration has been seen by GYN oncologist.  It now being seen today by radiation oncologist  REVIEW OF SYSTEMS:   Gen. status: Performance status is 2.  Not in any acute distress. GI: Appetite is  poor.  Lower abdominal discomfort. HEENT: No difficulty swallowing.  No headache.  No dizziness. Lungs: No shortness of breath patient has not smoked in the past. Cardiac: History of hypertension but no history of coronary artery disease GU: Had hematuria.  Hydronephrosis.  Bilateral percutaneous nephrostomy Skin: No rash Neurological system no headache no dizziness Lower extremity no swelling Musculoskeletal system no bony pain  As per HPI. Otherwise, a complete review of systems is negatve.  (All other 12 systems have been reviewed  PAST MEDICAL HISTORY: Past Medical History  Diagnosis Date  . Hypertension   . Shortness of breath dyspnea     doe  . Arthritis   . Complication of anesthesia     tonsillectomy age 65 bad experience with mask and hard to wake up  . Enlarged kidney   . Hernia of abdominal wall 12/04/2015    left side  . Acute kidney failure ([place]) 12/11/2015  . Carcinoma of cervix, stage 4 ([place]) 12/31/2015    PAST SURGICAL HISTORY: Past Surgical History  Procedure Laterality Date  . Tonsillectomy    . Cystoscopy w/ retrogrades Left 12/10/2015    Procedure: CYSTOSCOPY WITH RETROGRADE PYELOGRAM;  Surgeon: Hollice Espy, MD;  Location: [facility] ORS;  Service: Urology;  Laterality: Left;  . Transurethral resection of bladder tumor N/A 12/10/2015    Procedure: TRANSURETHRAL RESECTION OF BLADDER TUMOR (TURBT);  Surgeon: Hollice Espy, MD;  Location: [facility] ORS;  Service: Urology;  Laterality: N/A;    FAMILY HISTORY Family History  Problem Relation Age of Onset  . Cancer Mother   .  Prostate cancer Neg Hx   . Bladder Cancer Neg Hx   . Kidney cancer Neg Hx     GYNECOLOGIC HISTORY:  No LMP recorded. Patient is postmenopausal.     ADVANCED DIRECTIVES:  Patient does not have any living will or healthcare power of attorney.  Information was given .  Available resources had been discussed.  We will follow-up on subsequent appointments regarding this issue  HEALTH  MAINTENANCE: Social History  Substance Use Topics  . Smoking status: Never Smoker   . Smokeless tobacco: Never Used  . Alcohol Use: No       Allergies  Allergen Reactions  . Hydralazine Palpitations    Current Outpatient Prescriptions  Medication Sig Dispense Refill  . amLODipine (NORVASC) 10 MG tablet Take 10 mg by mouth daily. After lunch    . aspirin 81 MG tablet Take 81 mg by mouth every morning.     . Cholecalciferol (VITAMIN D3) 50000 units CAPS Take 1 capsule by mouth once a week.    . Cholecalciferol 4000 UNITS CAPS Take 4,000 Units by mouth daily.    Marland Kitchen docusate sodium (COLACE) 100 MG capsule Take 2 capsules (200 mg total) by mouth 2 (two) times daily. 120 capsule 0  . docusate sodium (RA COL-RITE) 100 MG capsule Take by mouth.    . quinapril (ACCUPRIL) 20 MG tablet Take 20 mg by mouth 2 (two) times daily.     No current facility-administered medications for this visit.    OBJECTIVE: PHYSICAL EXAM: GENERAL:  Patient is not in any distress.  Has lost significant weight.  Poor historian.  Family with the patient answering most of the questions . MENTAL STATUS:  Alert and oriented to person, place and time.  ENT:  Oropharynx clear without lesion.  Tongue normal. Mucous membranes moist.  RESPIRATORY:  Clear to auscultation without rales, wheezes or rhonchi. CARDIOVASCULAR:  Regular rate and rhythm without murmur, rub or gallop. BREAST:  Right breast without masses, skin changes or nipple discharge.  Left breast without masses, skin changes or nipple discharge. ABDOMEN:  Soft, non-tender, with active bowel sounds, and no hepatosplenomegaly.  No masses. BACK:  No CVA tenderness.  No tenderness on percussion of the back or rib cage. SKIN:  No rashes, ulcers or lesions. EXTREMITIES: No edema, no skin discoloration or tenderness.  No palpable cords. LYMPH NODES: No palpable cervical, supraclavicular, axillary or inguinal adenopathy  NEUROLOGICAL: Unremarkable. PSYCH:   Appropriate.  Filed Vitals:   12/31/15 0939  BP: 146/77  Pulse: 90  Temp: 95.7 F (35.4 C)  Resp: 18     Body mass index is 23.71 kg/(m^2).    ECOG FS:2 - Symptomatic, <50% confined to bed  LAB RESULTS:  No visits with results within 5 Day(s) from this visit. Latest known visit with results is:  Hospital Outpatient Visit on 12/21/2015  Component Date Value Ref Range Status  . Glucose-Capillary 12/21/2015 82  65 - 99 mg/dL Final     STUDIES: [place] Intraoperative  12/13/2015  CLINICAL DATA:  Ultrasound was provided for use by the ordering physician, and a technical charge was applied by the performing facility.  No radiologist interpretation/professional services rendered.   [place] Intraoperative  12/10/2015  CLINICAL DATA:  Ultrasound was provided for use by the ordering physician, and a technical charge was applied by the performing facility.  No radiologist interpretation/professional services rendered.   US Renal  12/12/2015  CLINICAL DATA:  Renal failure.  Right nephrostomy placed 12/10/2015 EXAM: RENAL / URINARY  TRACT ULTRASOUND COMPLETE COMPARISON:  CT 10/24/2015 FINDINGS: Right Kidney: Length: 10.3 cm. Right nephrostomy catheter in place. No hydronephrosis. Small cyst in the lower pole measures up to 1.6 cm. Left Kidney: Length: 11.4 cm.  Mild left hydronephrosis, new since prior CT. Bladder: Appears normal for degree of bladder distention. IMPRESSION: Resolution of the previously seen right hydronephrosis with right nephrostomy catheter in place. Mild left hydronephrosis.  This is new since prior CT. Electronically Signed   By: Rolm Baptise M.D.   On: 12/12/2015 09:39   Nm Pet Image Initial (pi) Skull Base To Thigh  12/26/2015  CLINICAL DATA:  Subsequent treatment strategy for T4 cervical cancer. EXAM: NUCLEAR MEDICINE PET SKULL BASE TO THIGH TECHNIQUE: 10.9 mCi F-18 FDG was injected intravenously. Full-ring PET imaging was performed from the skull base to thigh after the radiotracer.  CT data was obtained and used for attenuation correction and anatomic localization. FASTING BLOOD GLUCOSE:  Value: 82 mg/dl COMPARISON:  10/24/2015 abdominal pelvic CT. FINDINGS: NECK No areas of abnormal hypermetabolism. CHEST No areas of abnormal hypermetabolism. ABDOMEN/PELVIS Development of hepatic metastasis since 10/24/2015. Index left hepatic lobe lesion is relatively ill-defined on CT, but a S.U.V. max of 7.4 on image 128/series 3. This central right hepatic lobe lesion measures a S.U.V. max of 6.4 on image 126/series 3. Other multiple hypo attenuating lesions, including in the right liver lobe on image 145/series 3, new. Small retroperitoneal node demonstrates low-level hypermetabolism. this measures 7 mm and a S.U.V. max of 2.8 on image 155/series 3. 5 mm on the prior diagnostic study. Locally advanced cervical carcinoma, as evidenced by amorphous hypermetabolic soft tissue throughout the pelvis. This is increased. For example, this measures 8.1 x 8.0 cm and a S.U.V. max of 18.8 on image 207/series 3. Soft tissue density at the same level on the prior measures on the order of 5.8 x 6.0 cm (when remeasured). Left obturator node measures 1.5 cm and a S.U.V. max of 6.0 versus 8 mm on the prior diagnostic CT. SKELETON No abnormal marrow activity. CT IMAGES PERFORMED FOR ATTENUATION CORRECTION No cervical adenopathy. Mild cardiomegaly. Small right pleural effusion is new. Centrilobular emphysema. Scattered areas of atelectasis at the lung bases, worse on the right. Nonspecific mild is pulmonary nodularity, including a subpleural posterior left upper lobe 2 mm nodule image 96/series 3. Interval placement of bilateral nephrostomy tubes without residual hydronephrosis. Colonic stool burden suggests constipation. Ventral abdominal wall laxity containing nonobstructive transverse colon. Uterine fibroids. Right hip osteoarthritis. IMPRESSION: 1. Since 10/24/2015 diagnostic CT, disease progression. 2. New hepatic  metastasis. 3. Progression of locally advanced cervical carcinoma, as evidenced by enlargement of an amorphous hypermetabolic pelvic soft tissue mass. 4. New or progressive left obturator nodal metastasis. Small abdominal retroperitoneal node is mildly hypermetabolic and indeterminate. 5. Placement of bilateral nephrostomy catheters, without hydronephrosis. 6. New small right pleural effusion. Electronically Signed   By: Abigail Miyamoto M.D.   On: 12/26/2015 16:18   Ir  Nephroureteral Cath Place Left  12/13/2015  CLINICAL DATA:  Bladder tumor and status post right-sided percutaneous nephrostomy tube placement on 12/10/2015. There remains oliguric renal failure and repeat renal ultrasound on 12/12/2015 revealed progressive left hydronephrosis and the patient now requires a left percutaneous nephrostomy tube. EXAM: 1. ULTRASOUND GUIDANCE FOR PUNCTURE OF THE LEFT RENAL COLLECTING SYSTEM. 2. LEFT PERCUTANEOUS NEPHROSTOMY TUBE PLACEMENT. 3. EXCHANGE OF RIGHT PERCUTANEOUS NEPHROSTOMY TUBE. COMPARISON:  Imaging on 12/10/2015 ANESTHESIA/SEDATION: 2.5 mg IV Versed; 75 mcg IV Fentanyl. Total Moderate Sedation Time 30 minutes The  patient's level of consciousness and physiologic status were continuously monitored during the procedure by Radiology nursing. CONTRAST:  15 ml Omnipaque 300 MEDICATIONS: 400 mg IV Cipro. Ciprofloxacin was given within two hours of incision. FLUOROSCOPY TIME:  1 minute. PROCEDURE: The procedure, risks, benefits, and alternatives were explained to the patient. Questions regarding the procedure were encouraged and answered. The patient understands and consents to the procedure. A time-out was performed prior to the procedure. The left flank region was prepped with chlorhexidine in a sterile fashion, and a sterile drape was applied covering the operative field. A sterile gown and sterile gloves were used for the procedure. Local anesthesia was provided with 1% Lidocaine. Ultrasound was used to localize  the left kidney. Under direct ultrasound guidance, a 21 gauge needle was advanced into the renal collecting system. Ultrasound image documentation was performed. Aspiration of urine sample was performed followed by contrast injection. A transitional dilator was advanced over a guidewire. Percutaneous tract dilatation was then performed over the guidewire. A 10 -French percutaneous nephrostomy tube was then advanced and formed in the collecting system. Catheter position was confirmed by fluoroscopy after contrast injection. The catheter was secured at the skin with a Prolene retention suture and Stat-Lock device. A gravity bag was placed. The pre-existing right nephrostomy tube was prepped with chlorhexidine. Contrast injection was performed under fluoroscopy. The catheter was then cut and removed over a guidewire. A new 10 French nephrostomy tube was then advanced into the collecting system over the guidewire. The tube was formed and connected to a gravity drainage bag. The catheter was secured at the skin with a Prolene retention suture and StatLock device. COMPLICATIONS: None. FINDINGS: Ultrasound confirms persistent moderate left-sided hydronephrosis. After lower pole access, a 10 French nephrostomy tube was placed and formed in the renal pelvis. Urine return was blood-tinged after tube placement. It was noted that the previously placed 8 French right-sided nephrostomy to have retracted at the skin exit site at the level of a StatLock device. Fluoroscopy and contrast injection confirms retraction of the catheter into a lower pole calyx. The tube was exchanged and upsized for a 10 French nephrostomy tube which was formed in the central collecting system. The tube could not be completely advanced into the renal pelvis. IMPRESSION: 1. Placement of new 42 French left-sided nephrostomy tube to treat progressive hydronephrosis. The tube was formed in the renal pelvis and connected to a gravity drainage bag. 2. Exchange  of partially retracted previously placed right-sided nephrostomy tube. A new 10 French nephrostomy tube was advanced into the central collecting system and attached to a gravity drainage bag. Electronically Signed   By: Aletta Edouard M.D.   On: 12/13/2015 17:29   Ir  Nephroureteral Cath Place Right  12/11/2015  INDICATION: Right hydronephrosis secondary to ureteral obstruction. EXAM: IR NEPHROURETERAL CATH PLACEMENT RIGHT; ULTRAOUND INTRAOPERATIVE - NRPT [facility] COMPARISON:  CT scan of October 24, 2015. MEDICATIONS: 1 g Ancef IV; The antibiotic was administered in an appropriate time frame prior to skin puncture. ANESTHESIA/SEDATION: Fentanyl 50 mcg IV; Versed 2.0 mg IV Moderate Sedation Time:  15 minutes. The patient was continuously monitored during the procedure by the interventional radiology nurse under my direct supervision. CONTRAST:  42mL OMNIPAQUE IOHEXOL 300 MG/ML SOLN - administered into the collecting system(s) FLUOROSCOPY TIME:  Fluoroscopy Time: 2 minutes 30 seconds (391.25 mGy). COMPLICATIONS: None immediate. PROCEDURE: Informed written consent was obtained from the patient after a thorough discussion of the procedural risks, benefits and alternatives. All questions were addressed.  Maximal Sterile Barrier Technique was utilized including caps, mask, sterile gowns, sterile gloves, sterile drape, hand hygiene and skin antiseptic. A timeout was performed prior to the initiation of the procedure. Patient was placed prone on angiographic table. Right flank region was prepped and draped using sterile technique. Local anesthetic was applied. Under real-time ultrasound guidance, 21 gauge needle was directed into dilated right intrarenal collecting system. Contrast under fluoroscopy was injected for antegrade nephrostogram, which demonstrates severe right hydronephrosis. Guidewire was placed under fluoroscopic guidance, followed by 8 [place] dilator. Then, 8.5 French nephrostomy catheter was passed over the  wire, and its internal loop was internally secured. Under fluoroscopy, contrast was injected to confirm its position within the right renal pelvis. Dilatation of the right ureter was noted to the level of the sacroiliac joint, where it appears to be occluded. The external portion of the catheter was secured and hooked up to external drainage. IMPRESSION: Under ultrasound and fluoroscopic guidance, successful placement of 8.5 French nephrostomy catheter into right renal pelvis. This was hooked up to external drainage. Electronically Signed   By: Marijo Conception, M.D.   On: 12/11/2015 08:02   Ir Nephrostomy Exchange Right  12/13/2015  CLINICAL DATA:  Bladder tumor and status post right-sided percutaneous nephrostomy tube placement on 12/10/2015. There remains oliguric renal failure and repeat renal ultrasound on 12/12/2015 revealed progressive left hydronephrosis and the patient now requires a left percutaneous nephrostomy tube. EXAM: 1. ULTRASOUND GUIDANCE FOR PUNCTURE OF THE LEFT RENAL COLLECTING SYSTEM. 2. LEFT PERCUTANEOUS NEPHROSTOMY TUBE PLACEMENT. 3. EXCHANGE OF RIGHT PERCUTANEOUS NEPHROSTOMY TUBE. COMPARISON:  Imaging on 12/10/2015 ANESTHESIA/SEDATION: 2.5 mg IV Versed; 75 mcg IV Fentanyl. Total Moderate Sedation Time 30 minutes The patient's level of consciousness and physiologic status were continuously monitored during the procedure by Radiology nursing. CONTRAST:  15 ml Omnipaque 300 MEDICATIONS: 400 mg IV Cipro. Ciprofloxacin was given within two hours of incision. FLUOROSCOPY TIME:  1 minute. PROCEDURE: The procedure, risks, benefits, and alternatives were explained to the patient. Questions regarding the procedure were encouraged and answered. The patient understands and consents to the procedure. A time-out was performed prior to the procedure. The left flank region was prepped with chlorhexidine in a sterile fashion, and a sterile drape was applied covering the operative field. A sterile gown and  sterile gloves were used for the procedure. Local anesthesia was provided with 1% Lidocaine. Ultrasound was used to localize the left kidney. Under direct ultrasound guidance, a 21 gauge needle was advanced into the renal collecting system. Ultrasound image documentation was performed. Aspiration of urine sample was performed followed by contrast injection. A transitional dilator was advanced over a guidewire. Percutaneous tract dilatation was then performed over the guidewire. A 10 -French percutaneous nephrostomy tube was then advanced and formed in the collecting system. Catheter position was confirmed by fluoroscopy after contrast injection. The catheter was secured at the skin with a Prolene retention suture and Stat-Lock device. A gravity bag was placed. The pre-existing right nephrostomy tube was prepped with chlorhexidine. Contrast injection was performed under fluoroscopy. The catheter was then cut and removed over a guidewire. A new 10 French nephrostomy tube was then advanced into the collecting system over the guidewire. The tube was formed and connected to a gravity drainage bag. The catheter was secured at the skin with a Prolene retention suture and StatLock device. COMPLICATIONS: None. FINDINGS: Ultrasound confirms persistent moderate left-sided hydronephrosis. After lower pole access, a 10 French nephrostomy tube was placed and formed in the renal pelvis.  Urine return was blood-tinged after tube placement. It was noted that the previously placed 8 French right-sided nephrostomy to have retracted at the skin exit site at the level of a StatLock device. Fluoroscopy and contrast injection confirms retraction of the catheter into a lower pole calyx. The tube was exchanged and upsized for a 10 French nephrostomy tube which was formed in the central collecting system. The tube could not be completely advanced into the renal pelvis. IMPRESSION: 1. Placement of new 38 French left-sided nephrostomy tube to  treat progressive hydronephrosis. The tube was formed in the renal pelvis and connected to a gravity drainage bag. 2. Exchange of partially retracted previously placed right-sided nephrostomy tube. A new 10 French nephrostomy tube was advanced into the central collecting system and attached to a gravity drainage bag. Electronically Signed   By: Aletta Edouard M.D.   On: 12/13/2015 17:29    ASSESSMENT: Carcinoma of cervix.  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 PET scan has been reviewed independently and reviewed with the patient and family. Patient will benefit from local radiation therapy for control of symptoms but certainly would need systemic therapy to control disease in the liver. Used for plan of treatment is cis-platinum with radiation but I doubt patient will able to tolerate cis-platinum because a previous high creatinine.  In considering patient's metastases to the liver combination of carboplatinum and Taxol along with radiation followed by full dose of carboplatinum and Taxol might be beneficial to this patient.  2.  Proceed with port placement and chemotherapy class 3.  Overall prognosis and palliative approach has been discussed with the family Considering stage IV disease treatments are not curative that has been discussed 4.  Intent of chemotherapy is palliation radius side effects will be discussing chemotherapy class and I also discussed some of the side effects Discuss situation with Dr. Donella Stade today as well as case was discussed in tumor conference 5.  Bilateral percutaneous nephrostomy being taken care by Dr. Hollice Espy, urologist Laboratory check today   Patient expressed understanding and was in agreement with this plan. She also understands that She can call clinic at any time with any questions, concerns, or complaints.    Carcinoma of cervix, stage 4 ([place])   Staging form: Cervix  Uteri, AJCC 7th Edition     Clinical: FIGO Stage IVB (T4, N1, M1) - Signed by Forest Gleason, MD on 12/31/2015   Forest Gleason, MD   12/31/2015 10:18 AM

## 2015-12-31 NOTE — Progress Notes (Signed)
Pt. Here as new evaluation regarding ovarian mass. Referred by Dr. Theora Gianotti

## 2016-01-01 ENCOUNTER — Other Ambulatory Visit: Payer: Self-pay | Admitting: Vascular Surgery

## 2016-01-01 ENCOUNTER — Ambulatory Visit
Admission: RE | Admit: 2016-01-01 | Discharge: 2016-01-01 | Disposition: A | Discharge status: Home / Self Care | Payer: Medicare Other | Source: Ambulatory Visit | Attending: Radiation Oncology | Admitting: Radiation Oncology

## 2016-01-01 DIAGNOSIS — C787 Secondary malignant neoplasm of liver and intrahepatic bile duct: Secondary | ICD-10-CM | POA: Diagnosis not present

## 2016-01-01 DIAGNOSIS — M129 Arthropathy, unspecified: Secondary | ICD-10-CM | POA: Diagnosis not present

## 2016-01-01 DIAGNOSIS — Z510 Encounter for antineoplastic radiation therapy: Secondary | ICD-10-CM | POA: Diagnosis present

## 2016-01-01 DIAGNOSIS — Z79899 Other long term (current) drug therapy: Secondary | ICD-10-CM | POA: Diagnosis not present

## 2016-01-01 DIAGNOSIS — K469 Unspecified abdominal hernia without obstruction or gangrene: Secondary | ICD-10-CM | POA: Diagnosis not present

## 2016-01-01 DIAGNOSIS — C539 Malignant neoplasm of cervix uteri, unspecified: Secondary | ICD-10-CM | POA: Diagnosis not present

## 2016-01-01 DIAGNOSIS — R0602 Shortness of breath: Secondary | ICD-10-CM | POA: Diagnosis not present

## 2016-01-01 DIAGNOSIS — C7989 Secondary malignant neoplasm of other specified sites: Secondary | ICD-10-CM | POA: Diagnosis not present

## 2016-01-01 DIAGNOSIS — Z809 Family history of malignant neoplasm, unspecified: Secondary | ICD-10-CM | POA: Diagnosis not present

## 2016-01-01 DIAGNOSIS — Z7982 Long term (current) use of aspirin: Secondary | ICD-10-CM | POA: Diagnosis not present

## 2016-01-01 DIAGNOSIS — I10 Essential (primary) hypertension: Secondary | ICD-10-CM | POA: Diagnosis not present

## 2016-01-02 ENCOUNTER — Telehealth: Payer: Self-pay | Admitting: Radiology

## 2016-01-02 ENCOUNTER — Encounter: Payer: Self-pay | Admitting: Urology

## 2016-01-02 ENCOUNTER — Ambulatory Visit (INDEPENDENT_AMBULATORY_CARE_PROVIDER_SITE_OTHER): Payer: Medicare Other | Admitting: Urology

## 2016-01-02 VITALS — BP 131/71 | HR 53 | Ht 64.0 in | Wt 134.0 lb

## 2016-01-02 DIAGNOSIS — Z510 Encounter for antineoplastic radiation therapy: Secondary | ICD-10-CM | POA: Diagnosis not present

## 2016-01-02 DIAGNOSIS — C539 Malignant neoplasm of cervix uteri, unspecified: Secondary | ICD-10-CM

## 2016-01-02 DIAGNOSIS — N19 Unspecified kidney failure: Secondary | ICD-10-CM

## 2016-01-02 NOTE — Progress Notes (Signed)
6:17 PM  01/02/2016   Tonya Moreno [birth year] [id number]  Referring provider: Letta Median, MD [facility] [place], [place] [address]  Chief Complaint  Patient presents with  . Hydronephrosis    follow up    HPI: 82 year old female initially referred for incidental right hydronephrosis and gross hematuria found to have an infiltrating bladder mass obstructing the trigone.  She was taken to the operating room on 10/08/2016 at which time she underwent transurethral resection of her bladder tumor.  Bimanual exam in the OR also revealed a fixed tumor involving the anterior vaginal wall.   In the PACU, she is relatively anuric and stat labs revealed that she was in florid renal failure.  She had a somewhat prolonged hospital admission ultimately requiring bilateral percutaneous nephrostomy tubes at which time her creatinine began to normalize.  Surgical pathology consistent with invasive carcinoma of cervical origin. CT PET shows evidence of metastasis to the liver along with significant lymphadenopathy.  Stage IVa.    She has now been seen and evaluated by Dr. Theora Gianotti, Dr. Oliva Bustard, and Dr. Baruch Gouty at the cancer center. She is scheduled to start palliative chemotherapy and radiation for her stage IV cervical cancer on Monday.  He is scheduled for port placement tomorrow with Dr. Lucky Cowboy.  Her nephrostomy tubes are working well. They have been in place approximately 3 weeks. She is now able to empty the bag herself.  PMH: Past Medical History  Diagnosis Date  . Hypertension   . Shortness of breath dyspnea     doe  . Arthritis   . Complication of anesthesia     tonsillectomy age 8 bad experience with mask and hard to wake up  . Enlarged kidney   . Hernia of abdominal wall 12/04/2015    left side  . Acute kidney failure ([place]) 12/11/2015  . Carcinoma of cervix, stage 4 ([place]) 12/31/2015    Surgical History: Past Surgical History  Procedure Laterality Date  .  Tonsillectomy    . Cystoscopy w/ retrogrades Left 12/10/2015    Procedure: CYSTOSCOPY WITH RETROGRADE PYELOGRAM;  Surgeon: Hollice Espy, MD;  Location: [facility] ORS;  Service: Urology;  Laterality: Left;  . Transurethral resection of bladder tumor N/A 12/10/2015    Procedure: TRANSURETHRAL RESECTION OF BLADDER TUMOR (TURBT);  Surgeon: Hollice Espy, MD;  Location: [facility] ORS;  Service: Urology;  Laterality: N/A;    Home Medications:    Medication List       This list is accurate as of: 01/02/16  6:17 PM.  Always use your most recent med list.               amLODipine 10 MG tablet  Commonly known as:  NORVASC  Take 10 mg by mouth daily. After lunch     aspirin 81 MG tablet  Take 81 mg by mouth every morning.     Cholecalciferol 4000 units Caps  Take 4,000 Units by mouth daily. Reported on 01/02/2016     Vitamin D3 50000 units Caps  Take 1 capsule by mouth once a week. Reported on 01/02/2016     dexamethasone 4 MG tablet  Commonly known as:  DECADRON  Take 2 tablets by mouth the night before chemotherapy only     RA COL-RITE 100 MG capsule  Generic drug:  docusate sodium  Take by mouth. Reported on 01/02/2016     docusate sodium 100 MG capsule  Commonly known as:  COLACE  Take 2 capsules (200 mg  total) by mouth 2 (two) times daily.     quinapril 20 MG tablet  Commonly known as:  ACCUPRIL  Take 20 mg by mouth 2 (two) times daily.        Allergies:  Allergies  Allergen Reactions  . Hydralazine Palpitations    Family History: Family History  Problem Relation Age of Onset  . Cancer Mother   . Prostate cancer Neg Hx   . Bladder Cancer Neg Hx   . Kidney cancer Neg Hx     Social History:  reports that she has never smoked. She has never used smokeless tobacco. She reports that she does not drink alcohol or use illicit drugs.  ROS: UROLOGY Frequent Urination?: No Hard to postpone urination?: No Burning/pain with urination?: No Get up at night to urinate?: No Leakage  of urine?: Yes Urine stream starts and stops?: No Trouble starting stream?: No Do you have to strain to urinate?: No Blood in urine?: No Urinary tract infection?: No Sexually transmitted disease?: No Injury to kidneys or bladder?: No Painful intercourse?: No Weak stream?: No Currently pregnant?: No Vaginal bleeding?: No Last menstrual period?: n  Gastrointestinal Nausea?: No Vomiting?: No Indigestion/heartburn?: No Diarrhea?: No Constipation?: No  Constitutional Fever: No Night sweats?: No Weight loss?: Yes Fatigue?: No  Skin Skin rash/lesions?: No Itching?: No  Eyes Blurred vision?: No Double vision?: No  Ears/Nose/Throat Sore throat?: No Sinus problems?: No  Hematologic/Lymphatic Swollen glands?: No Easy bruising?: No  Cardiovascular Leg swelling?: No Chest pain?: No  Respiratory Cough?: No Shortness of breath?: Yes  Endocrine Excessive thirst?: No  Musculoskeletal Back pain?: No Joint pain?: No  Neurological Headaches?: No Dizziness?: No  Psychologic Depression?: No Anxiety?: No  Physical Exam: BP 131/71 mmHg  Pulse 53  Ht 5\' 4"  (1.626 m)  Wt 134 lb (60.782 kg)  BMI 22.99 kg/m2  Constitutional:  Alert and oriented, No acute distress.  Accompanied by her daughter today. HEENT: [place] AT, moist mucus membranes.  Trachea midline, no masses. Cardiovascular: No clubbing, cyanosis, or edema. Respiratory: Normal respiratory effort, no increased work of breathing. GI: Abdomen is soft, nontender, nondistended, no abdominal masses GU: Bilateral nephrostomy tube sites clean dry and intact, draining clear yellow urine. Skin: No rashes, bruises or suspicious lesions. Neurologic: Grossly intact, no focal deficits, moving all 4 extremities. Psychiatric: Quiet, somewhat flat today  Laboratory Data: Lab Results  Component Value Date   WBC 5.4 12/31/2015   HGB 10.4* 12/31/2015   HCT 31.1* 12/31/2015   MCV 87.2 12/31/2015   PLT 284 12/31/2015     Lab Results  Component Value Date   CREATININE 1.12* 12/31/2015    Assessment & Plan:    1. Cervical cancer, stage IVA Scheduled to initiate palliative chemo and radiation Monday.   2. Renal failure Secondary to obstructive uropathy, creatinine back to baseline with bilateral percutaneous nephrostomy tubes. Given initiation of radiation/chemotherapy next week, prefer to go ahead and exchange nephrostomy tubes prior to this rather than during treatment and her immunocompromised state. We'll arrange for this later this week.   Return in about 6 weeks (around 02/13/2016).  Hollice Espy, MD  [facility] [address], [place], [place] [address] [phone]

## 2016-01-02 NOTE — Telephone Encounter (Signed)
Pt's daughter, April, notified of bilateral nephrostomy tube exchange on 01/04/16. Pt is to arrive at registration at 12:30, be npo after 4 am except she should take her HTN meds (Norvasc & quinapril) as usual with a sip of water. April voices understanding.

## 2016-01-03 ENCOUNTER — Encounter
Admission: RE | Disposition: A | Discharge status: Home / Self Care | Payer: Self-pay | Source: Ambulatory Visit | Attending: Vascular Surgery

## 2016-01-03 ENCOUNTER — Ambulatory Visit
Admission: RE | Admit: 2016-01-03 | Discharge: 2016-01-03 | Disposition: A | Discharge status: Home / Self Care | Payer: Medicare Other | Source: Ambulatory Visit | Attending: Vascular Surgery | Admitting: Vascular Surgery

## 2016-01-03 ENCOUNTER — Other Ambulatory Visit: Payer: Self-pay | Admitting: Urology

## 2016-01-03 DIAGNOSIS — Z9889 Other specified postprocedural states: Secondary | ICD-10-CM | POA: Insufficient documentation

## 2016-01-03 DIAGNOSIS — I129 Hypertensive chronic kidney disease with stage 1 through stage 4 chronic kidney disease, or unspecified chronic kidney disease: Secondary | ICD-10-CM | POA: Insufficient documentation

## 2016-01-03 DIAGNOSIS — Z888 Allergy status to other drugs, medicaments and biological substances status: Secondary | ICD-10-CM | POA: Insufficient documentation

## 2016-01-03 DIAGNOSIS — N179 Acute kidney failure, unspecified: Secondary | ICD-10-CM | POA: Insufficient documentation

## 2016-01-03 DIAGNOSIS — N189 Chronic kidney disease, unspecified: Secondary | ICD-10-CM | POA: Diagnosis not present

## 2016-01-03 DIAGNOSIS — N289 Disorder of kidney and ureter, unspecified: Secondary | ICD-10-CM | POA: Insufficient documentation

## 2016-01-03 DIAGNOSIS — K439 Ventral hernia without obstruction or gangrene: Secondary | ICD-10-CM | POA: Insufficient documentation

## 2016-01-03 DIAGNOSIS — Z8541 Personal history of malignant neoplasm of cervix uteri: Secondary | ICD-10-CM | POA: Insufficient documentation

## 2016-01-03 DIAGNOSIS — C539 Malignant neoplasm of cervix uteri, unspecified: Secondary | ICD-10-CM | POA: Insufficient documentation

## 2016-01-03 DIAGNOSIS — Z809 Family history of malignant neoplasm, unspecified: Secondary | ICD-10-CM | POA: Diagnosis not present

## 2016-01-03 DIAGNOSIS — R0602 Shortness of breath: Secondary | ICD-10-CM | POA: Diagnosis not present

## 2016-01-03 DIAGNOSIS — M1990 Unspecified osteoarthritis, unspecified site: Secondary | ICD-10-CM | POA: Diagnosis not present

## 2016-01-03 DIAGNOSIS — N1330 Unspecified hydronephrosis: Secondary | ICD-10-CM | POA: Insufficient documentation

## 2016-01-03 DIAGNOSIS — Z510 Encounter for antineoplastic radiation therapy: Secondary | ICD-10-CM | POA: Diagnosis not present

## 2016-01-03 HISTORY — PX: PERIPHERAL VASCULAR CATHETERIZATION: SHX172C

## 2016-01-03 SURGERY — PORTA CATH INSERTION
Anesthesia: Moderate Sedation

## 2016-01-03 MED ORDER — SODIUM CHLORIDE 0.9 % IV SOLN: 500.0000 mL | Freq: Once | INTRAVENOUS | Status: DC | PRN

## 2016-01-03 MED ORDER — MIDAZOLAM HCL 5 MG/5ML IJ SOLN
INTRAMUSCULAR | Status: AC
  Filled 2016-01-03: qty 5

## 2016-01-03 MED ORDER — HEPARIN (PORCINE) IN NACL 2-0.9 UNIT/ML-% IJ SOLN
INTRAMUSCULAR | Status: AC
  Filled 2016-01-03: qty 500

## 2016-01-03 MED ORDER — SODIUM CHLORIDE 0.9 % IR SOLN
Freq: Once | Status: DC
  Filled 2016-01-03: qty 2

## 2016-01-03 MED ORDER — PHENOL 1.4 % MT LIQD: 1.0000 | OROMUCOSAL | Status: DC | PRN

## 2016-01-03 MED ORDER — LIDOCAINE-EPINEPHRINE (PF) 1 %-1:200000 IJ SOLN
INTRAMUSCULAR | Status: DC | PRN
  Administered 2016-01-03: 10 mL via INTRADERMAL

## 2016-01-03 MED ORDER — SODIUM CHLORIDE 0.9 % IR SOLN
Status: DC | PRN
  Administered 2016-01-03: 10:00:00

## 2016-01-03 MED ORDER — FENTANYL CITRATE (PF) 100 MCG/2ML IJ SOLN
INTRAMUSCULAR | Status: DC | PRN
  Administered 2016-01-03: 50 ug via INTRAVENOUS

## 2016-01-03 MED ORDER — MORPHINE SULFATE (PF) 4 MG/ML IV SOLN: 2.0000 mg | INTRAVENOUS | Status: DC | PRN

## 2016-01-03 MED ORDER — HYDROMORPHONE HCL 1 MG/ML IJ SOLN: 1.0000 mg | Freq: Once | INTRAMUSCULAR | Status: DC

## 2016-01-03 MED ORDER — GUAIFENESIN-DM 100-10 MG/5ML PO SYRP
15.0000 mL | ORAL_SOLUTION | ORAL | Status: DC | PRN
Start: 2016-01-03 — End: 2016-01-03
  Filled 2016-01-03: qty 15

## 2016-01-03 MED ORDER — ACETAMINOPHEN 325 MG PO TABS: 325.0000 mg | ORAL_TABLET | ORAL | Status: DC | PRN

## 2016-01-03 MED ORDER — LIDOCAINE-EPINEPHRINE (PF) 1 %-1:200000 IJ SOLN
INTRAMUSCULAR | Status: AC
  Filled 2016-01-03: qty 30

## 2016-01-03 MED ORDER — OXYCODONE-ACETAMINOPHEN 5-325 MG PO TABS: 1.0000 | ORAL_TABLET | ORAL | Status: DC | PRN

## 2016-01-03 MED ORDER — METHYLPREDNISOLONE SODIUM SUCC 125 MG IJ SOLR: 125.0000 mg | INTRAMUSCULAR | Status: DC | PRN

## 2016-01-03 MED ORDER — HYDRALAZINE HCL 20 MG/ML IJ SOLN: 5.0000 mg | INTRAMUSCULAR | Status: DC | PRN

## 2016-01-03 MED ORDER — SODIUM CHLORIDE 0.9 % IV SOLN
INTRAVENOUS | Status: DC
  Administered 2016-01-03: 09:00:00 via INTRAVENOUS

## 2016-01-03 MED ORDER — DEXTROSE 5 % IV SOLN
1.5000 g | INTRAVENOUS | Status: AC
  Administered 2016-01-03: 1.5 g via INTRAVENOUS

## 2016-01-03 MED ORDER — LABETALOL HCL 5 MG/ML IV SOLN: 10.0000 mg | INTRAVENOUS | Status: DC | PRN

## 2016-01-03 MED ORDER — ALUM & MAG HYDROXIDE-SIMETH 200-200-20 MG/5ML PO SUSP: 15.0000 mL | ORAL | Status: DC | PRN

## 2016-01-03 MED ORDER — METOPROLOL TARTRATE 1 MG/ML IV SOLN: 2.0000 mg | INTRAVENOUS | Status: DC | PRN

## 2016-01-03 MED ORDER — ONDANSETRON HCL 4 MG/2ML IJ SOLN: 4.0000 mg | Freq: Four times a day (QID) | INTRAMUSCULAR | Status: DC | PRN

## 2016-01-03 MED ORDER — FAMOTIDINE 20 MG PO TABS: 40.0000 mg | ORAL_TABLET | ORAL | Status: DC | PRN

## 2016-01-03 MED ORDER — MIDAZOLAM HCL 2 MG/2ML IJ SOLN
INTRAMUSCULAR | Status: DC | PRN
  Administered 2016-01-03: 2 mg via INTRAVENOUS

## 2016-01-03 MED ORDER — ACETAMINOPHEN 325 MG RE SUPP
325.0000 mg | RECTAL | Status: DC | PRN
  Filled 2016-01-03: qty 2

## 2016-01-03 MED ORDER — FENTANYL CITRATE (PF) 100 MCG/2ML IJ SOLN
INTRAMUSCULAR | Status: AC
  Filled 2016-01-03: qty 2

## 2016-01-03 SURGICAL SUPPLY — 10 items
BAG DECANTER STRL (MISCELLANEOUS) ×3 IMPLANT
PACK ANGIOGRAPHY (CUSTOM PROCEDURE TRAY) ×3 IMPLANT
PAD GROUND ADULT SPLIT (MISCELLANEOUS) ×3 IMPLANT
PENCIL ELECTRO HAND CTR (MISCELLANEOUS) ×3 IMPLANT
PORTACATH POWER 8F (Port) ×3 IMPLANT
PREP CHG 10.5 TEAL (MISCELLANEOUS) ×3 IMPLANT
SUT MNCRL AB 4-0 PS2 18 (SUTURE) ×3 IMPLANT
SUT PROLENE 0 CT 1 30 (SUTURE) ×3 IMPLANT
SUTURE VIC 3-0 (SUTURE) ×3 IMPLANT
TOWEL OR 17X26 4PK STRL BLUE (TOWEL DISPOSABLE) ×3 IMPLANT

## 2016-01-03 NOTE — H&P (Signed)
[facility] Admission History & Physical  MRN : [id number]  Tonya Moreno is a 82 y.o. (05-Feb-1934) female who presents with chief complaint of No chief complaint on file. Marland Kitchen  History of Present Illness:  Patient is a 82 year old female who was sent over from her oncologist for placement of a Port-A-Cath. She has bladder tumor and hydronephrosis issues which have been managed surgically over the past several weeks. She has cervical cancer. She needs a Port-A-Cath for therapy and durable venous access. Her urologic issues seem to be better after treatment. She has no specific complaints today. Current Facility-Administered Medications  Medication Dose Route Frequency Provider Last Rate Last Dose  . cefUROXime (ZINACEF) 1.5 g in dextrose 5 % 50 mL IVPB  1.5 g Intravenous 30 min Pre-Op Sela Hua, PA-C        Past Medical History  Diagnosis Date  . Hypertension   . Shortness of breath dyspnea     doe  . Arthritis   . Complication of anesthesia     tonsillectomy age 48 bad experience with mask and hard to wake up  . Enlarged kidney   . Hernia of abdominal wall 12/04/2015    left side  . Acute kidney failure ([place]) 12/11/2015  . Carcinoma of cervix, stage 4 ([place]) 12/31/2015    Past Surgical History  Procedure Laterality Date  . Tonsillectomy    . Cystoscopy w/ retrogrades Left 12/10/2015    Procedure: CYSTOSCOPY WITH RETROGRADE PYELOGRAM;  Surgeon: Hollice Espy, MD;  Location: [facility] ORS;  Service: Urology;  Laterality: Left;  . Transurethral resection of bladder tumor N/A 12/10/2015    Procedure: TRANSURETHRAL RESECTION OF BLADDER TUMOR (TURBT);  Surgeon: Hollice Espy, MD;  Location: [facility] OR[facility];  Service: Urology;  Laterality: N/A;    Social History Social History  Substance Use Topics  . Smoking status: Never Smoker   . Smokeless tobacco: Never Used  . Alcohol Use: No   no IV drug use  Family History Family History  Problem Relation Age of  Onset  . Cancer Mother   . Prostate cancer Neg Hx   . Bladder Cancer Neg Hx   . Kidney cancer Neg Hx    no bleeding or clotting disorders  Allergies  Allergen Reactions  . Hydralazine Palpitations     REVIEW OF SYSTEMS (Negative unless checked)  Constitutional: [] Weight loss  [] Fever  [] Chills Cardiac: [] Chest pain   [] Chest pressure   [] Palpitations   [] Shortness of breath when laying flat   [] Shortness of breath at rest   [] Shortness of breath with exertion. Vascular:  [] Pain in legs with walking   [] Pain in legs at rest   [] Pain in legs when laying flat   [] Claudication   [] Pain in feet when walking  [] Pain in feet at rest  [] Pain in feet when laying flat   [] History of DVT   [] Phlebitis   [] Swelling in legs   [] Varicose veins   [] Non-healing ulcers Pulmonary:   [] Uses home oxygen   [] Productive cough   [] Hemoptysis   [] Wheeze  [] COPD   [] Asthma Neurologic:  [] Dizziness  [] Blackouts   [] Seizures   [] History of stroke   [] History of TIA  [] Aphasia   [] Temporary blindness   [] Dysphagia   [] Weakness or numbness in arms   [] Weakness or numbness in legs Musculoskeletal:  [] Arthritis   [] Joint swelling   [] Joint pain   [] Low back pain Hematologic:  [] Easy bruising  [] Easy bleeding   [] Hypercoagulable  state   [] Anemic  [] Hepatitis Gastrointestinal:  [] Blood in stool   [] Vomiting blood  [] Gastroesophageal reflux/heartburn   [] Difficulty swallowing. Genitourinary:  [x] Chronic kidney disease   [x] Difficult urination  [] Frequent urination  [] Burning with urination   [x] Blood in urine Skin:  [] Rashes   [] Ulcers   [] Wounds Psychological:  [] History of anxiety   []  History of major depression.  Physical Examination  There were no vitals filed for this visit. There is no weight on file to calculate BMI. Gen: WD/WN, NAD Head: [place]/AT, No temporalis wasting. Prominent temp pulse not noted. Ear/Nose/Throat: Hearing grossly intact, nares w/o erythema or drainage, oropharynx w/o Erythema/Exudate,   Eyes: PERRLA, EOMI.  Neck: Supple, no nuchal rigidity.  No JVD.  Pulmonary:  Good air movement, no use of accessory muscles.  Cardiac: RRR, normal S1, S2 Vascular:  Vessel Right Left  Radial Palpable Palpable                                   Gastrointestinal: soft, non-tender/non-distended. No guarding/reflex.  Musculoskeletal: M/S 5/5 throughout.  Extremities without ischemic changes.  No deformity or atrophy.  Neurologic: CN 2-12 intact. Pain and light touch intact in extremities.  Symmetrical.  Speech is fluent. Motor exam as listed above. Psychiatric: Judgment intact, Mood & affect appropriate for pt's clinical situation. Dermatologic: No rashes or ulcers noted.  No cellulitis or open wounds. Lymph : No Cervical, Axillary, or Inguinal lymphadenopathy.   CBC Lab Results  Component Value Date   WBC 5.4 12/31/2015   HGB 10.4* 12/31/2015   HCT 31.1* 12/31/2015   MCV 87.2 12/31/2015   PLT 284 12/31/2015    BMET    Component Value Date/Time   NA 131* 12/31/2015 1043   NA 138 12/20/2015 0933   K 3.1* 12/31/2015 1043   CL 96* 12/31/2015 1043   CO2 27 12/31/2015 1043   GLUCOSE 106* 12/31/2015 1043   GLUCOSE 101* 12/20/2015 0933   BUN 14 12/31/2015 1043   BUN 9 12/20/2015 0933   CREATININE 1.12* 12/31/2015 1043   CALCIUM 9.6 12/31/2015 1043   GFRNONAA 45* 12/31/2015 1043   GFRAA 53* 12/31/2015 1043   Estimated Creatinine Clearance: 35.2 mL/min (by C-G formula based on Cr of 1.12).  COAG Lab Results  Component Value Date   INR 1.33 12/11/2015    Radiology [place] Intraoperative  12/13/2015  CLINICAL DATA:  Ultrasound was provided for use by the ordering physician, and a technical charge was applied by the performing facility.  No radiologist interpretation/professional services rendered.   [place] Intraoperative  12/10/2015  CLINICAL DATA:  Ultrasound was provided for use by the ordering physician, and a technical charge was applied by the performing facility.  No  radiologist interpretation/professional services rendered.   US Renal  12/12/2015  CLINICAL DATA:  Renal failure.  Right nephrostomy placed 12/10/2015 EXAM: RENAL / URINARY TRACT ULTRASOUND COMPLETE COMPARISON:  CT 10/24/2015 FINDINGS: Right Kidney: Length: 10.3 cm. Right nephrostomy catheter in place. No hydronephrosis. Small cyst in the lower pole measures up to 1.6 cm. Left Kidney: Length: 11.4 cm.  Mild left hydronephrosis, new since prior CT. Bladder: Appears normal for degree of bladder distention. IMPRESSION: Resolution of the previously seen right hydronephrosis with right nephrostomy catheter in place. Mild left hydronephrosis.  This is new since prior CT. Electronically Signed   By: Rolm Baptise M.D.   On: 12/12/2015 09:39   Nm Pet Image Initial (pi) Skull  Base To Thigh  12/26/2015  CLINICAL DATA:  Subsequent treatment strategy for T4 cervical cancer. EXAM: NUCLEAR MEDICINE PET SKULL BASE TO THIGH TECHNIQUE: 10.9 mCi F-18 FDG was injected intravenously. Full-ring PET imaging was performed from the skull base to thigh after the radiotracer. CT data was obtained and used for attenuation correction and anatomic localization. FASTING BLOOD GLUCOSE:  Value: 82 mg/dl COMPARISON:  10/24/2015 abdominal pelvic CT. FINDINGS: NECK No areas of abnormal hypermetabolism. CHEST No areas of abnormal hypermetabolism. ABDOMEN/PELVIS Development of hepatic metastasis since 10/24/2015. Index left hepatic lobe lesion is relatively ill-defined on CT, but a S.U.V. max of 7.4 on image 128/series 3. This central right hepatic lobe lesion measures a S.U.V. max of 6.4 on image 126/series 3. Other multiple hypo attenuating lesions, including in the right liver lobe on image 145/series 3, new. Small retroperitoneal node demonstrates low-level hypermetabolism. this measures 7 mm and a S.U.V. max of 2.8 on image 155/series 3. 5 mm on the prior diagnostic study. Locally advanced cervical carcinoma, as evidenced by amorphous  hypermetabolic soft tissue throughout the pelvis. This is increased. For example, this measures 8.1 x 8.0 cm and a S.U.V. max of 18.8 on image 207/series 3. Soft tissue density at the same level on the prior measures on the order of 5.8 x 6.0 cm (when remeasured). Left obturator node measures 1.5 cm and a S.U.V. max of 6.0 versus 8 mm on the prior diagnostic CT. SKELETON No abnormal marrow activity. CT IMAGES PERFORMED FOR ATTENUATION CORRECTION No cervical adenopathy. Mild cardiomegaly. Small right pleural effusion is new. Centrilobular emphysema. Scattered areas of atelectasis at the lung bases, worse on the right. Nonspecific mild is pulmonary nodularity, including a subpleural posterior left upper lobe 2 mm nodule image 96/series 3. Interval placement of bilateral nephrostomy tubes without residual hydronephrosis. Colonic stool burden suggests constipation. Ventral abdominal wall laxity containing nonobstructive transverse colon. Uterine fibroids. Right hip osteoarthritis. IMPRESSION: 1. Since 10/24/2015 diagnostic CT, disease progression. 2. New hepatic metastasis. 3. Progression of locally advanced cervical carcinoma, as evidenced by enlargement of an amorphous hypermetabolic pelvic soft tissue mass. 4. New or progressive left obturator nodal metastasis. Small abdominal retroperitoneal node is mildly hypermetabolic and indeterminate. 5. Placement of bilateral nephrostomy catheters, without hydronephrosis. 6. New small right pleural effusion. Electronically Signed   By: Abigail Miyamoto M.D.   On: 12/26/2015 16:18   Ir  Nephroureteral Cath Place Left  12/13/2015  CLINICAL DATA:  Bladder tumor and status post right-sided percutaneous nephrostomy tube placement on 12/10/2015. There remains oliguric renal failure and repeat renal ultrasound on 12/12/2015 revealed progressive left hydronephrosis and the patient now requires a left percutaneous nephrostomy tube. EXAM: 1. ULTRASOUND GUIDANCE FOR PUNCTURE OF THE LEFT  RENAL COLLECTING SYSTEM. 2. LEFT PERCUTANEOUS NEPHROSTOMY TUBE PLACEMENT. 3. EXCHANGE OF RIGHT PERCUTANEOUS NEPHROSTOMY TUBE. COMPARISON:  Imaging on 12/10/2015 ANESTHESIA/SEDATION: 2.5 mg IV Versed; 75 mcg IV Fentanyl. Total Moderate Sedation Time 30 minutes The patient's level of consciousness and physiologic status were continuously monitored during the procedure by Radiology nursing. CONTRAST:  15 ml Omnipaque 300 MEDICATIONS: 400 mg IV Cipro. Ciprofloxacin was given within two hours of incision. FLUOROSCOPY TIME:  1 minute. PROCEDURE: The procedure, risks, benefits, and alternatives were explained to the patient. Questions regarding the procedure were encouraged and answered. The patient understands and consents to the procedure. A time-out was performed prior to the procedure. The left flank region was prepped with chlorhexidine in a sterile fashion, and a sterile drape was applied covering the operative field.  A sterile gown and sterile gloves were used for the procedure. Local anesthesia was provided with 1% Lidocaine. Ultrasound was used to localize the left kidney. Under direct ultrasound guidance, a 21 gauge needle was advanced into the renal collecting system. Ultrasound image documentation was performed. Aspiration of urine sample was performed followed by contrast injection. A transitional dilator was advanced over a guidewire. Percutaneous tract dilatation was then performed over the guidewire. A 10 -French percutaneous nephrostomy tube was then advanced and formed in the collecting system. Catheter position was confirmed by fluoroscopy after contrast injection. The catheter was secured at the skin with a Prolene retention suture and Stat-Lock device. A gravity bag was placed. The pre-existing right nephrostomy tube was prepped with chlorhexidine. Contrast injection was performed under fluoroscopy. The catheter was then cut and removed over a guidewire. A new 10 French nephrostomy tube was then  advanced into the collecting system over the guidewire. The tube was formed and connected to a gravity drainage bag. The catheter was secured at the skin with a Prolene retention suture and StatLock device. COMPLICATIONS: None. FINDINGS: Ultrasound confirms persistent moderate left-sided hydronephrosis. After lower pole access, a 10 French nephrostomy tube was placed and formed in the renal pelvis. Urine return was blood-tinged after tube placement. It was noted that the previously placed 8 French right-sided nephrostomy to have retracted at the skin exit site at the level of a StatLock device. Fluoroscopy and contrast injection confirms retraction of the catheter into a lower pole calyx. The tube was exchanged and upsized for a 10 French nephrostomy tube which was formed in the central collecting system. The tube could not be completely advanced into the renal pelvis. IMPRESSION: 1. Placement of new 52 French left-sided nephrostomy tube to treat progressive hydronephrosis. The tube was formed in the renal pelvis and connected to a gravity drainage bag. 2. Exchange of partially retracted previously placed right-sided nephrostomy tube. A new 10 French nephrostomy tube was advanced into the central collecting system and attached to a gravity drainage bag. Electronically Signed   By: Aletta Edouard M.D.   On: 12/13/2015 17:29   Ir  Nephroureteral Cath Place Right  12/11/2015  INDICATION: Right hydronephrosis secondary to ureteral obstruction. EXAM: IR NEPHROURETERAL CATH PLACEMENT RIGHT; ULTRAOUND INTRAOPERATIVE - NRPT [facility] COMPARISON:  CT scan of October 24, 2015. MEDICATIONS: 1 g Ancef IV; The antibiotic was administered in an appropriate time frame prior to skin puncture. ANESTHESIA/SEDATION: Fentanyl 50 mcg IV; Versed 2.0 mg IV Moderate Sedation Time:  15 minutes. The patient was continuously monitored during the procedure by the interventional radiology nurse under my direct supervision. CONTRAST:  78mL  OMNIPAQUE IOHEXOL 300 MG/ML SOLN - administered into the collecting system(s) FLUOROSCOPY TIME:  Fluoroscopy Time: 2 minutes 30 seconds (391.25 mGy). COMPLICATIONS: None immediate. PROCEDURE: Informed written consent was obtained from the patient after a thorough discussion of the procedural risks, benefits and alternatives. All questions were addressed. Maximal Sterile Barrier Technique was utilized including caps, mask, sterile gowns, sterile gloves, sterile drape, hand hygiene and skin antiseptic. A timeout was performed prior to the initiation of the procedure. Patient was placed prone on angiographic table. Right flank region was prepped and draped using sterile technique. Local anesthetic was applied. Under real-time ultrasound guidance, 21 gauge needle was directed into dilated right intrarenal collecting system. Contrast under fluoroscopy was injected for antegrade nephrostogram, which demonstrates severe right hydronephrosis. Guidewire was placed under fluoroscopic guidance, followed by 8 [place] dilator. Then, 8.5 French nephrostomy catheter was passed  over the wire, and its internal loop was internally secured. Under fluoroscopy, contrast was injected to confirm its position within the right renal pelvis. Dilatation of the right ureter was noted to the level of the sacroiliac joint, where it appears to be occluded. The external portion of the catheter was secured and hooked up to external drainage. IMPRESSION: Under ultrasound and fluoroscopic guidance, successful placement of 8.5 French nephrostomy catheter into right renal pelvis. This was hooked up to external drainage. Electronically Signed   By: Marijo Conception, M.D.   On: 12/11/2015 08:02   Ir Nephrostomy Exchange Right  12/13/2015  CLINICAL DATA:  Bladder tumor and status post right-sided percutaneous nephrostomy tube placement on 12/10/2015. There remains oliguric renal failure and repeat renal ultrasound on 12/12/2015 revealed progressive left  hydronephrosis and the patient now requires a left percutaneous nephrostomy tube. EXAM: 1. ULTRASOUND GUIDANCE FOR PUNCTURE OF THE LEFT RENAL COLLECTING SYSTEM. 2. LEFT PERCUTANEOUS NEPHROSTOMY TUBE PLACEMENT. 3. EXCHANGE OF RIGHT PERCUTANEOUS NEPHROSTOMY TUBE. COMPARISON:  Imaging on 12/10/2015 ANESTHESIA/SEDATION: 2.5 mg IV Versed; 75 mcg IV Fentanyl. Total Moderate Sedation Time 30 minutes The patient's level of consciousness and physiologic status were continuously monitored during the procedure by Radiology nursing. CONTRAST:  15 ml Omnipaque 300 MEDICATIONS: 400 mg IV Cipro. Ciprofloxacin was given within two hours of incision. FLUOROSCOPY TIME:  1 minute. PROCEDURE: The procedure, risks, benefits, and alternatives were explained to the patient. Questions regarding the procedure were encouraged and answered. The patient understands and consents to the procedure. A time-out was performed prior to the procedure. The left flank region was prepped with chlorhexidine in a sterile fashion, and a sterile drape was applied covering the operative field. A sterile gown and sterile gloves were used for the procedure. Local anesthesia was provided with 1% Lidocaine. Ultrasound was used to localize the left kidney. Under direct ultrasound guidance, a 21 gauge needle was advanced into the renal collecting system. Ultrasound image documentation was performed. Aspiration of urine sample was performed followed by contrast injection. A transitional dilator was advanced over a guidewire. Percutaneous tract dilatation was then performed over the guidewire. A 10 -French percutaneous nephrostomy tube was then advanced and formed in the collecting system. Catheter position was confirmed by fluoroscopy after contrast injection. The catheter was secured at the skin with a Prolene retention suture and Stat-Lock device. A gravity bag was placed. The pre-existing right nephrostomy tube was prepped with chlorhexidine. Contrast injection  was performed under fluoroscopy. The catheter was then cut and removed over a guidewire. A new 10 French nephrostomy tube was then advanced into the collecting system over the guidewire. The tube was formed and connected to a gravity drainage bag. The catheter was secured at the skin with a Prolene retention suture and StatLock device. COMPLICATIONS: None. FINDINGS: Ultrasound confirms persistent moderate left-sided hydronephrosis. After lower pole access, a 10 French nephrostomy tube was placed and formed in the renal pelvis. Urine return was blood-tinged after tube placement. It was noted that the previously placed 8 French right-sided nephrostomy to have retracted at the skin exit site at the level of a StatLock device. Fluoroscopy and contrast injection confirms retraction of the catheter into a lower pole calyx. The tube was exchanged and upsized for a 10 French nephrostomy tube which was formed in the central collecting system. The tube could not be completely advanced into the renal pelvis. IMPRESSION: 1. Placement of new 31 French left-sided nephrostomy tube to treat progressive hydronephrosis. The tube was formed in the  renal pelvis and connected to a gravity drainage bag. 2. Exchange of partially retracted previously placed right-sided nephrostomy tube. A new 10 French nephrostomy tube was advanced into the central collecting system and attached to a gravity drainage bag. Electronically Signed   By: Aletta Edouard M.D.   On: 12/13/2015 17:29      Assessment/Plan 1. Cervical cancer. We are asked to place a port for chemotherapy and durable venous access which seems reasonable. Risks and benefits are discussed. 2. Renal insufficiency secondary to hydronephrosis and chronic kidney disease. Last GFR would indicate stage IV CK D. 3. Hypertension. Stable on outpatient medications.   DEW,JASON, MD  01/03/2016 8:40 AM

## 2016-01-03 NOTE — Op Note (Signed)
      [facility]       Operative Note  Date: 01/03/2016  Preoperative diagnosis:  1. Cervical cancer  Postoperative diagnosis:  Same as above  Procedures: #1. Ultrasound guidance for vascular access to the right internal jugular vein. #2. Fluoroscopic guidance for placement of catheter. #3. Placement of CT compatible Port-A-Cath, right internal jugular vein.  Surgeon: Leotis Pain, MD.   Anesthesia: Local with moderate conscious sedation for approximately 25 minutes using 2 mg of Versed and 50 mcg of Fentanyl  Fluoroscopy time: less than 1 minute  Contrast used: 0  Estimated blood loss: minimal  Indication for the procedure:  The patient is a 82 y.o.female with cervical cancer.  The patient needs a Port-A-Cath for durable venous access, chemotherapy, lab draws, and CT scans. We are asked to place this. Risks and benefits were discussed and informed consent was obtained.  Description of procedure: The patient was brought to the vascular and interventional radiology suite.  Moderate conscious sedation was administered throughout the procedure with my supervision of the RN administering medicines and monitoring the patient's vital signs, pulse oximetry, telemetry and mental status throughout from the start of the procedure until the patient was taken to the recovery room. The right neck chest and shoulder were sterilely prepped and draped, and a sterile surgical field was created. Ultrasound was used to help visualize a patent right internal jugular vein. This was then accessed under direct ultrasound guidance without difficulty with the Seldinger needle and a permanent image was recorded. A J-wire was placed. After skin nick and dilatation, the peel-away sheath was then placed over the wire. I then anesthetized an area under the clavicle approximately 1-2 fingerbreadths. A transverse incision was created and an inferior pocket was created with electrocautery and blunt  dissection. The port was then brought onto the field, placed into the pocket and secured to the chest wall with 2 Prolene sutures. The catheter was connected to the port and tunneled from the subclavicular incision to the access site. Fluoroscopic guidance was then used to cut the catheter to an appropriate length. The catheter was then placed through the peel-away sheath and the peel-away sheath was removed. The catheter tip was parked in excellent location under fluorocoscopic guidance in the mid SVC. The pocket was then irrigated with antibiotic impregnated saline and the wound was closed with a running 3-0 Vicryl and a 4-0 Monocryl. The access incision was closed with a single 4-0 Monocryl. The Huber needle was used to withdraw blood and flush the port with heparinized saline. Dermabond was then placed as a dressing. The patient tolerated the procedure well and was taken to the recovery room in stable condition.   DEW,JASON 01/03/2016 10:40 AM

## 2016-01-03 NOTE — Progress Notes (Signed)
Discharge instructions reviewed with patient and family, Port information packet given to patient. Patient tolerated procedure and is awake and alert with no reported pain. VSS, will discharge shortly per orders.

## 2016-01-04 ENCOUNTER — Inpatient Hospital Stay: Payer: Medicare Other

## 2016-01-04 ENCOUNTER — Ambulatory Visit
Admission: RE | Admit: 2016-01-04 | Discharge: 2016-01-04 | Disposition: A | Discharge status: Home / Self Care | Payer: Medicare Other | Source: Ambulatory Visit | Attending: Urology | Admitting: Urology

## 2016-01-04 ENCOUNTER — Encounter: Payer: Self-pay | Admitting: Vascular Surgery

## 2016-01-04 ENCOUNTER — Other Ambulatory Visit: Payer: Self-pay | Admitting: Radiology

## 2016-01-04 ENCOUNTER — Telehealth: Payer: Self-pay | Admitting: *Deleted

## 2016-01-04 DIAGNOSIS — Z8541 Personal history of malignant neoplasm of cervix uteri: Secondary | ICD-10-CM | POA: Insufficient documentation

## 2016-01-04 DIAGNOSIS — T8389XA Other specified complication of genitourinary prosthetic devices, implants and grafts, initial encounter: Secondary | ICD-10-CM | POA: Diagnosis not present

## 2016-01-04 DIAGNOSIS — C539 Malignant neoplasm of cervix uteri, unspecified: Secondary | ICD-10-CM

## 2016-01-04 DIAGNOSIS — N135 Crossing vessel and stricture of ureter without hydronephrosis: Secondary | ICD-10-CM | POA: Insufficient documentation

## 2016-01-04 MED ORDER — CEFAZOLIN SODIUM 1-5 GM-% IV SOLN
INTRAVENOUS | Status: AC
  Filled 2016-01-04: qty 50

## 2016-01-04 MED ORDER — LIDOCAINE-PRILOCAINE 2.5-2.5 % EX CREA: 1.0000 "application " | TOPICAL_CREAM | CUTANEOUS | Status: DC | PRN

## 2016-01-04 MED ORDER — SODIUM CHLORIDE 0.9 % IV SOLN
INTRAVENOUS | Status: DC
  Administered 2016-01-04: 13:00:00 via INTRAVENOUS

## 2016-01-04 MED ORDER — IOHEXOL 300 MG/ML  SOLN
30.0000 mL | Freq: Once | INTRAMUSCULAR | Status: AC | PRN
  Administered 2016-01-04: 20 mL

## 2016-01-04 MED ORDER — LIDOCAINE HCL (PF) 1 % IJ SOLN
INTRAMUSCULAR | Status: AC | PRN
  Administered 2016-01-04: 10 mL

## 2016-01-04 MED ORDER — CEFAZOLIN SODIUM 1-5 GM-% IV SOLN
1.0000 g | Freq: Once | INTRAVENOUS | Status: AC
  Administered 2016-01-04: 1 g via INTRAVENOUS
  Filled 2016-01-04: qty 50

## 2016-01-04 NOTE — Telephone Encounter (Signed)
Pt was in chemo class and needs emla cream sent in.  It was sent in electronically and anita will call and let pt know.

## 2016-01-04 NOTE — Telephone Encounter (Signed)
emla cream escribed to [facility].

## 2016-01-04 NOTE — Telephone Encounter (Signed)
Attempted to call patient to let her know EMLA cream has been called to pharmacy.  Voice mailbox full. Unable to leave message.  However, spoke with Magdalene Patricia, RN who requested prescription for patient and she had advised the family to continue to check with the pharmacy/

## 2016-01-04 NOTE — Procedures (Signed)
Bilateral nephrostomy exchange without difficulty  Complications:  None  Blood Loss: none  See dictation in canopy pacs

## 2016-01-07 ENCOUNTER — Inpatient Hospital Stay (HOSPITAL_BASED_OUTPATIENT_CLINIC_OR_DEPARTMENT_OTHER): Payer: Medicare Other | Admitting: Oncology

## 2016-01-07 ENCOUNTER — Other Ambulatory Visit: Payer: Self-pay | Admitting: Oncology

## 2016-01-07 ENCOUNTER — Ambulatory Visit
Admission: RE | Admit: 2016-01-07 | Discharge: 2016-01-07 | Disposition: A | Discharge status: Home / Self Care | Payer: Medicare Other | Source: Ambulatory Visit | Attending: Radiation Oncology | Admitting: Radiation Oncology

## 2016-01-07 ENCOUNTER — Inpatient Hospital Stay: Payer: Medicare Other

## 2016-01-07 ENCOUNTER — Encounter: Payer: Self-pay | Admitting: Oncology

## 2016-01-07 VITALS — BP 130/82 | HR 81 | Resp 18

## 2016-01-07 VITALS — BP 113/73 | HR 73 | Temp 98.4°F | Ht 64.0 in | Wt 131.8 lb

## 2016-01-07 DIAGNOSIS — D259 Leiomyoma of uterus, unspecified: Secondary | ICD-10-CM

## 2016-01-07 DIAGNOSIS — Z5111 Encounter for antineoplastic chemotherapy: Secondary | ICD-10-CM | POA: Diagnosis not present

## 2016-01-07 DIAGNOSIS — Z809 Family history of malignant neoplasm, unspecified: Secondary | ICD-10-CM

## 2016-01-07 DIAGNOSIS — Z7982 Long term (current) use of aspirin: Secondary | ICD-10-CM

## 2016-01-07 DIAGNOSIS — Z79899 Other long term (current) drug therapy: Secondary | ICD-10-CM

## 2016-01-07 DIAGNOSIS — R0600 Dyspnea, unspecified: Secondary | ICD-10-CM

## 2016-01-07 DIAGNOSIS — I517 Cardiomegaly: Secondary | ICD-10-CM

## 2016-01-07 DIAGNOSIS — I10 Essential (primary) hypertension: Secondary | ICD-10-CM

## 2016-01-07 DIAGNOSIS — J90 Pleural effusion, not elsewhere classified: Secondary | ICD-10-CM

## 2016-01-07 DIAGNOSIS — C539 Malignant neoplasm of cervix uteri, unspecified: Secondary | ICD-10-CM

## 2016-01-07 DIAGNOSIS — N1330 Unspecified hydronephrosis: Secondary | ICD-10-CM | POA: Diagnosis not present

## 2016-01-07 DIAGNOSIS — C787 Secondary malignant neoplasm of liver and intrahepatic bile duct: Secondary | ICD-10-CM | POA: Diagnosis not present

## 2016-01-07 DIAGNOSIS — Z510 Encounter for antineoplastic radiation therapy: Secondary | ICD-10-CM | POA: Diagnosis not present

## 2016-01-07 DIAGNOSIS — J432 Centrilobular emphysema: Secondary | ICD-10-CM

## 2016-01-07 DIAGNOSIS — R590 Localized enlarged lymph nodes: Secondary | ICD-10-CM

## 2016-01-07 DIAGNOSIS — N179 Acute kidney failure, unspecified: Secondary | ICD-10-CM | POA: Diagnosis not present

## 2016-01-07 DIAGNOSIS — R54 Age-related physical debility: Secondary | ICD-10-CM

## 2016-01-07 DIAGNOSIS — N820 Vesicovaginal fistula: Secondary | ICD-10-CM

## 2016-01-07 DIAGNOSIS — Z780 Asymptomatic menopausal state: Secondary | ICD-10-CM

## 2016-01-07 DIAGNOSIS — M1611 Unilateral primary osteoarthritis, right hip: Secondary | ICD-10-CM

## 2016-01-07 DIAGNOSIS — R002 Palpitations: Secondary | ICD-10-CM

## 2016-01-07 MED ORDER — SODIUM CHLORIDE 0.9 % IV SOLN
20.0000 mg | Freq: Once | INTRAVENOUS | Status: AC
  Administered 2016-01-07: 20 mg via INTRAVENOUS
  Filled 2016-01-07: qty 2

## 2016-01-07 MED ORDER — SODIUM CHLORIDE 0.9 % IV SOLN
Freq: Once | INTRAVENOUS | Status: AC | PRN
  Administered 2016-01-07: 13:00:00 via INTRAVENOUS
  Filled 2016-01-07: qty 1000

## 2016-01-07 MED ORDER — HYDROCORTISONE NA SUCCINATE PF 100 MG IJ SOLR
200.0000 mg | Freq: Once | INTRAMUSCULAR | Status: AC
  Administered 2016-01-07: 200 mg via INTRAVENOUS

## 2016-01-07 MED ORDER — SODIUM CHLORIDE 0.9 % IV SOLN
130.0000 mg | Freq: Once | INTRAVENOUS | Status: AC
  Administered 2016-01-07: 130 mg via INTRAVENOUS
  Filled 2016-01-07: qty 13

## 2016-01-07 MED ORDER — HEPARIN SOD (PORK) LOCK FLUSH 100 UNIT/ML IV SOLN
500.0000 [IU] | Freq: Once | INTRAVENOUS | Status: AC
  Administered 2016-01-07: 500 [IU] via INTRAVENOUS
  Filled 2016-01-07: qty 5

## 2016-01-07 MED ORDER — SODIUM CHLORIDE 0.9 % IV SOLN: 137.4000 mg | Freq: Once | INTRAVENOUS | Status: DC

## 2016-01-07 MED ORDER — FAMOTIDINE IN NACL 20-0.9 MG/50ML-% IV SOLN
20.0000 mg | Freq: Once | INTRAVENOUS | Status: AC
  Administered 2016-01-07: 20 mg via INTRAVENOUS
  Filled 2016-01-07: qty 50

## 2016-01-07 MED ORDER — PALONOSETRON HCL INJECTION 0.25 MG/5ML
0.2500 mg | Freq: Once | INTRAVENOUS | Status: AC
  Administered 2016-01-07: 0.25 mg via INTRAVENOUS
  Filled 2016-01-07: qty 5

## 2016-01-07 MED ORDER — DIPHENHYDRAMINE HCL 50 MG/ML IJ SOLN
25.0000 mg | Freq: Once | INTRAMUSCULAR | Status: AC | PRN
Start: 2016-01-07 — End: 2016-01-07
  Administered 2016-01-07: 25 mg via INTRAVENOUS

## 2016-01-07 MED ORDER — SODIUM CHLORIDE 0.9% FLUSH
10.0000 mL | INTRAVENOUS | Status: DC | PRN
  Administered 2016-01-07: 10 mL via INTRAVENOUS
  Filled 2016-01-07: qty 10

## 2016-01-07 MED ORDER — SODIUM CHLORIDE 0.9 % IV SOLN
Freq: Once | INTRAVENOUS | Status: AC
  Administered 2016-01-07: 12:00:00 via INTRAVENOUS
  Filled 2016-01-07: qty 1000

## 2016-01-07 MED ORDER — DIPHENHYDRAMINE HCL 50 MG/ML IJ SOLN
50.0000 mg | Freq: Once | INTRAMUSCULAR | Status: AC
  Administered 2016-01-07: 50 mg via INTRAVENOUS
  Filled 2016-01-07: qty 1

## 2016-01-07 MED ORDER — DEXTROSE 5 % IV SOLN
45.0000 mg/m2 | Freq: Once | INTRAVENOUS | Status: AC
  Administered 2016-01-07: 72 mg via INTRAVENOUS
  Filled 2016-01-07: qty 12

## 2016-01-07 NOTE — Progress Notes (Signed)
1245-Patient lethargic and presented with a red flushed appearance to her face. Patient states, "I feel a little swimmy headed and like I can't get my breath." Taxol stopped. MD, Dr. Oliva Bustard, notified via telephone and present at patient's chairside by 1248. Cancer center adverse reaction protocol followed. Normal Saline, Solu-Cortef, and Benadryl given per protocol. See MAR for medications. See vital sign flowsheet. 1256- patient states, "I feel better." Patient alert. No further facial flushing present. VSS at this time. Will continue to monitor patient. Per MD, Dr. Oliva Bustard, order: Hold/do not give anymore of the Taxol. Wait 30 minutes and proceed with Carboplatin treatment. Carboplatin treatment to be started around 1330 as long as patient does not experience any further signs or symptoms.

## 2016-01-07 NOTE — Progress Notes (Addendum)
[place] [facility] Telephone:([phone]) [phone]  Fax:([phone]) [place]: [birth year]  MR#: [id number]  KDT#:[id number]  Patient Care Team: Letta Median, MD as PCP - General Clent Jacks, RN as Registered Nurse Hollice Espy, MD as Consulting Physician (Urology)  CHIEF COMPLAINT:  Chief Complaint  Patient presents with  . 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  Diagnosis by cystoscopy in March of 2017 2.  Hydronephrosis was rising serum creatinine status post bilateral percutaneous nephrostomy drainage (March of 2017). 3.Patient is starting radiation therapy carboplatinum and Taxol from January 07, 2016  VISIT DIAGNOSIS:     ICD-9-CM ICD-10-CM   1. Carcinoma of Tonya, stage 4 (HCC) 180.9 C53.9 dexamethasone (DECADRON) 4 MG tablet     ondansetron (ZOFRAN) 4 MG tablet     CBC with Differential     Comprehensive metabolic panel     Magnesium      No history exists.    Oncology Flowsheet 12/10/2015 12/11/2015  dexamethasone (DECADRON) IV - -  ondansetron (ZOFRAN) IV - 4 mg    INTERVAL HISTORY:  82 year old African-American lady very poor historian started having lower abdominal discomfort bleeding from vagina or urine not sure at that point in time further evaluation revealed hydronephrosis cystoscopy revealed a bladder mass which on biopsy.  Revealed invasive carcinoma with squamous differentiation. He is and underwent PET scan which revealed metastases to the liver.  Postoperatively patient's creatinine has increased so patient ended up with bilateral nephrostomy tube following that creatinine has been improving.  Patient denies any pain does not have any bleeding has a poor appetite has lost some weight.  Here for further follow-up and treatment consideration has been seen by GYN oncologist.  It now being seen today by radiation oncologist  REVIEW OF SYSTEMS:   Gen.  status: Performance status is 2.  Not in any acute distress. She and is noncommunicative answers questions but does not participate in conversation.  Most of the questions were answered by family.  Had a port placement.  Patient has attended chemotherapy class. GI: Appetite is poor.  Lower abdominal discomfort. HEENT: No difficulty swallowing.  No headache.  No dizziness. Lungs: No shortness of breath patient has not smoked in the past. Cardiac: History of hypertension but no history of coronary artery disease GU: Had hematuria.  Hydronephrosis.  Bilateral percutaneous nephrostomy Skin: No rash Neurological system no headache no dizziness Lower extremity no swelling Musculoskeletal system no bony pain  As per HPI. Otherwise, a complete review of systems is negatve.  (All other 12 systems have been reviewed  PAST MEDICAL HISTORY: Past Medical History  Diagnosis Date  . Hypertension   . Shortness of breath dyspnea     doe  . Arthritis   . Complication of anesthesia     tonsillectomy age 39 bad experience with mask and hard to wake up  . Enlarged kidney   . Hernia of abdominal wall 12/04/2015    left side  . Acute kidney failure ([place]) 12/11/2015  . Carcinoma of Tonya, stage 4 ([place]) 12/31/2015    PAST SURGICAL HISTORY: Past Surgical History  Procedure Laterality Date  . Tonsillectomy    . Cystoscopy w/ retrogrades Left 12/10/2015    Procedure: CYSTOSCOPY WITH RETROGRADE PYELOGRAM;  Surgeon: Hollice Espy, MD;  Location: [facility] ORS;  Service: Urology;  Laterality: Left;  . Transurethral resection of bladder tumor N/A 12/10/2015  Procedure: TRANSURETHRAL RESECTION OF BLADDER TUMOR (TURBT);  Surgeon: Hollice Espy, MD;  Location: [facility] OR[facility];  Service: Urology;  Laterality: N/A;  . Peripheral vascular catheterization N/A 01/03/2016    Procedure: Porta Cath Insertion;  Surgeon: Algernon Huxley, MD;  Location: [place] [facility];  Service: Cardiovascular;  Laterality: N/A;    FAMILY  HISTORY Family History  Problem Relation Age of Onset  . Moreno Mother   . Prostate Moreno Neg Hx   . Bladder Moreno Neg Hx   . Kidney Moreno Neg Hx     GYNECOLOGIC HISTORY:  No LMP recorded. Patient is postmenopausal.     ADVANCED DIRECTIVES:  Patient does not have any living will or healthcare power of attorney.  Information was given .  Available resources had been discussed.  We will follow-up on subsequent appointments regarding this issue  HEALTH MAINTENANCE: Social History  Substance Use Topics  . Smoking status: Never Smoker   . Smokeless tobacco: Never Used  . Alcohol Use: No       Allergies  Allergen Reactions  . Hydralazine Palpitations    Current Outpatient Prescriptions  Medication Sig Dispense Refill  . amLODipine (NORVASC) 10 MG tablet Take 10 mg by mouth daily. After lunch    . aspirin 81 MG tablet Take 81 mg by mouth every morning.     . Cholecalciferol (VITAMIN D3) 50000 units CAPS Take 1 capsule by mouth once a week. Reported on 01/02/2016    . Cholecalciferol 4000 UNITS CAPS Take 4,000 Units by mouth daily. Reported on 01/02/2016    . dexamethasone (DECADRON) 4 MG tablet take 2 tablets by mouth THE NIGHT BEFORE CHEMOTHERAPY ONLY  0  . docusate sodium (COLACE) 100 MG capsule Take 2 capsules (200 mg total) by mouth 2 (two) times daily. 120 capsule 0  . docusate sodium (RA COL-RITE) 100 MG capsule Take by mouth. Reported on 01/02/2016    . lidocaine-prilocaine (EMLA) cream Apply 1 application topically as needed. 30 g 3  . ondansetron (ZOFRAN) 4 MG tablet take 1 tablet by mouth every 6 hours if needed  0  . quinapril (ACCUPRIL) 20 MG tablet Take 20 mg by mouth 2 (two) times daily.     No current facility-administered medications for this visit.    OBJECTIVE: PHYSICAL EXAM: GENERAL:  Patient is not in any distress.  Has lost significant weight.  Poor historian.  Family with the patient answering most of the questions . MENTAL STATUS:  Alert and oriented to  person, place and time.  ENT:  Oropharynx clear without lesion.  Tongue normal. Mucous membranes moist.  RESPIRATORY:  Clear to auscultation without rales, wheezes or rhonchi. CARDIOVASCULAR:  Regular rate and rhythm without murmur, rub or gallop. BREAST:  Right breast without masses, skin changes or nipple discharge.  Left breast without masses, skin changes or nipple discharge. ABDOMEN:  Soft, non-tender, with active bowel sounds, and no hepatosplenomegaly.  No masses.  Ventral hernia BACK:  No CVA tenderness.  No tenderness on percussion of the back or rib cage. SKIN:  No rashes, ulcers or lesions. EXTREMITIES: No edema, no skin discoloration or tenderness.  No palpable cords. LYMPH NODES: No palpable cervical, supraclavicular, axillary or inguinal adenopathy  NEUROLOGICAL: Unremarkable. PSYCH:  Appropriate.  Filed Vitals:   01/07/16 1021  BP: 113/73  Pulse: 73  Temp: 98.4 F (36.9 C)     Body mass index is 22.62 kg/(m^2).    ECOG FS:2 - Symptomatic, <50% confined to bed  LAB RESULTS:  No visits with results within 5 Day(s) from this visit. Latest known visit with results is:  Appointment on 12/31/2015  Component Date Value Ref Range Status  . WBC 12/31/2015 5.4  3.6 - 11.0 K/uL Final  . RBC 12/31/2015 3.57* 3.80 - 5.20 MIL/uL Final  . Hemoglobin 12/31/2015 10.4* 12.0 - 16.0 g/dL Final  . HCT 12/31/2015 31.1* 35.0 - 47.0 % Final  . MCV 12/31/2015 87.2  80.0 - 100.0 fL Final  . MCH 12/31/2015 29.1  26.0 - 34.0 pg Final  . MCHC 12/31/2015 33.4  32.0 - 36.0 g/dL Final  . RDW 12/31/2015 14.2  11.5 - 14.5 % Final  . Platelets 12/31/2015 284  150 - 440 K/uL Final  . Neutrophils Relative % 12/31/2015 65   Final  . Neutro Abs 12/31/2015 3.5  1.4 - 6.5 K/uL Final  . Lymphocytes Relative 12/31/2015 22   Final  . Lymphs Abs 12/31/2015 1.2  1.0 - 3.6 K/uL Final  . Monocytes Relative 12/31/2015 10   Final  . Monocytes Absolute 12/31/2015 0.6  0.2 - 0.9 K/uL Final  . Eosinophils  Relative 12/31/2015 2   Final  . Eosinophils Absolute 12/31/2015 0.1  0 - 0.7 K/uL Final  . Basophils Relative 12/31/2015 1   Final  . Basophils Absolute 12/31/2015 0.1  0 - 0.1 K/uL Final  . Sodium 12/31/2015 131* 135 - 145 mmol/L Final  . Potassium 12/31/2015 3.1* 3.5 - 5.1 mmol/L Final  . Chloride 12/31/2015 96* 101 - 111 mmol/L Final  . CO2 12/31/2015 27  22 - 32 mmol/L Final  . Glucose, Bld 12/31/2015 106* 65 - 99 mg/dL Final  . BUN 12/31/2015 14  6 - 20 mg/dL Final  . Creatinine, Ser 12/31/2015 1.12* 0.44 - 1.00 mg/dL Final  . Calcium 12/31/2015 9.6  8.9 - 10.3 mg/dL Final  . Total Protein 12/31/2015 7.4  6.5 - 8.1 g/dL Final  . Albumin 12/31/2015 3.4* 3.5 - 5.0 g/dL Final  . AST 12/31/2015 22  15 - 41 U/L Final  . ALT 12/31/2015 9* 14 - 54 U/L Final  . Alkaline Phosphatase 12/31/2015 112  38 - 126 U/L Final  . Total Bilirubin 12/31/2015 0.4  0.3 - 1.2 mg/dL Final  . GFR calc non Af Amer 12/31/2015 45* >60 mL/min Final  . GFR calc Af Amer 12/31/2015 53* >60 mL/min Final   Comment: (NOTE) The eGFR has been calculated using the CKD EPI equation. This calculation has not been validated in all clinical situations. eGFR's persistently <60 mL/min signify possible Chronic Kidney Disease.   . Anion gap 12/31/2015 8  5 - 15 Final  . Magnesium 12/31/2015 2.1  1.7 - 2.4 mg/dL Final     STUDIES: [place] Intraoperative  12/13/2015  CLINICAL DATA:  Ultrasound was provided for use by the ordering physician, and a technical charge was applied by the performing facility.  No radiologist interpretation/professional services rendered.   [place] Intraoperative  12/10/2015  CLINICAL DATA:  Ultrasound was provided for use by the ordering physician, and a technical charge was applied by the performing facility.  No radiologist interpretation/professional services rendered.   US Renal  12/12/2015  CLINICAL DATA:  Renal failure.  Right nephrostomy placed 12/10/2015 EXAM: RENAL / URINARY TRACT ULTRASOUND  COMPLETE COMPARISON:  CT 10/24/2015 FINDINGS: Right Kidney: Length: 10.3 cm. Right nephrostomy catheter in place. No hydronephrosis. Small cyst in the lower pole measures up to 1.6 cm. Left Kidney: Length: 11.4 cm.  Mild left hydronephrosis, new since prior CT.  Bladder: Appears normal for degree of bladder distention. IMPRESSION: Resolution of the previously seen right hydronephrosis with right nephrostomy catheter in place. Mild left hydronephrosis.  This is new since prior CT. Electronically Signed   By: Rolm Baptise M.D.   On: 12/12/2015 09:39   Nm Pet Image Initial (pi) Skull Base To Thigh  12/26/2015  CLINICAL DATA:  Subsequent treatment strategy for T4 cervical Moreno. EXAM: NUCLEAR MEDICINE PET SKULL BASE TO THIGH TECHNIQUE: 10.9 mCi F-18 FDG was injected intravenously. Full-ring PET imaging was performed from the skull base to thigh after the radiotracer. CT data was obtained and used for attenuation correction and anatomic localization. FASTING BLOOD GLUCOSE:  Value: 82 mg/dl COMPARISON:  10/24/2015 abdominal pelvic CT. FINDINGS: NECK No areas of abnormal hypermetabolism. CHEST No areas of abnormal hypermetabolism. ABDOMEN/PELVIS Development of hepatic metastasis since 10/24/2015. Index left hepatic lobe lesion is relatively ill-defined on CT, but a S.U.V. max of 7.4 on image 128/series 3. This central right hepatic lobe lesion measures a S.U.V. max of 6.4 on image 126/series 3. Other multiple hypo attenuating lesions, including in the right liver lobe on image 145/series 3, new. Small retroperitoneal node demonstrates low-level hypermetabolism. this measures 7 mm and a S.U.V. max of 2.8 on image 155/series 3. 5 mm on the prior diagnostic study. Locally advanced cervical carcinoma, as evidenced by amorphous hypermetabolic soft tissue throughout the pelvis. This is increased. For example, this measures 8.1 x 8.0 cm and a S.U.V. max of 18.8 on image 207/series 3. Soft tissue density at the same level on the  prior measures on the order of 5.8 x 6.0 cm (when remeasured). Left obturator node measures 1.5 cm and a S.U.V. max of 6.0 versus 8 mm on the prior diagnostic CT. SKELETON No abnormal marrow activity. CT IMAGES PERFORMED FOR ATTENUATION CORRECTION No cervical adenopathy. Mild cardiomegaly. Small right pleural effusion is new. Centrilobular emphysema. Scattered areas of atelectasis at the lung bases, worse on the right. Nonspecific mild is pulmonary nodularity, including a subpleural posterior left upper lobe 2 mm nodule image 96/series 3. Interval placement of bilateral nephrostomy tubes without residual hydronephrosis. Colonic stool burden suggests constipation. Ventral abdominal wall laxity containing nonobstructive transverse colon. Uterine fibroids. Right hip osteoarthritis. IMPRESSION: 1. Since 10/24/2015 diagnostic CT, disease progression. 2. New hepatic metastasis. 3. Progression of locally advanced cervical carcinoma, as evidenced by enlargement of an amorphous hypermetabolic pelvic soft tissue mass. 4. New or progressive left obturator nodal metastasis. Small abdominal retroperitoneal node is mildly hypermetabolic and indeterminate. 5. Placement of bilateral nephrostomy catheters, without hydronephrosis. 6. New small right pleural effusion. Electronically Signed   By: Abigail Miyamoto M.D.   On: 12/26/2015 16:18   Ir  Nephroureteral Cath Place Left  12/13/2015  CLINICAL DATA:  Bladder tumor and status post right-sided percutaneous nephrostomy tube placement on 12/10/2015. There remains oliguric renal failure and repeat renal ultrasound on 12/12/2015 revealed progressive left hydronephrosis and the patient now requires a left percutaneous nephrostomy tube. EXAM: 1. ULTRASOUND GUIDANCE FOR PUNCTURE OF THE LEFT RENAL COLLECTING SYSTEM. 2. LEFT PERCUTANEOUS NEPHROSTOMY TUBE PLACEMENT. 3. EXCHANGE OF RIGHT PERCUTANEOUS NEPHROSTOMY TUBE. COMPARISON:  Imaging on 12/10/2015 ANESTHESIA/SEDATION: 2.5 mg IV Versed; 75  mcg IV Fentanyl. Total Moderate Sedation Time 30 minutes The patient's level of consciousness and physiologic status were continuously monitored during the procedure by Radiology nursing. CONTRAST:  15 ml Omnipaque 300 MEDICATIONS: 400 mg IV Cipro. Ciprofloxacin was given within two hours of incision. FLUOROSCOPY TIME:  1 minute. PROCEDURE: The procedure, risks,  benefits, and alternatives were explained to the patient. Questions regarding the procedure were encouraged and answered. The patient understands and consents to the procedure. A time-out was performed prior to the procedure. The left flank region was prepped with chlorhexidine in a sterile fashion, and a sterile drape was applied covering the operative field. A sterile gown and sterile gloves were used for the procedure. Local anesthesia was provided with 1% Lidocaine. Ultrasound was used to localize the left kidney. Under direct ultrasound guidance, a 21 gauge needle was advanced into the renal collecting system. Ultrasound image documentation was performed. Aspiration of urine sample was performed followed by contrast injection. A transitional dilator was advanced over a guidewire. Percutaneous tract dilatation was then performed over the guidewire. A 10 -French percutaneous nephrostomy tube was then advanced and formed in the collecting system. Catheter position was confirmed by fluoroscopy after contrast injection. The catheter was secured at the skin with a Prolene retention suture and Stat-Lock device. A gravity bag was placed. The pre-existing right nephrostomy tube was prepped with chlorhexidine. Contrast injection was performed under fluoroscopy. The catheter was then cut and removed over a guidewire. A new 10 French nephrostomy tube was then advanced into the collecting system over the guidewire. The tube was formed and connected to a gravity drainage bag. The catheter was secured at the skin with a Prolene retention suture and StatLock device.  COMPLICATIONS: None. FINDINGS: Ultrasound confirms persistent moderate left-sided hydronephrosis. After lower pole access, a 10 French nephrostomy tube was placed and formed in the renal pelvis. Urine return was blood-tinged after tube placement. It was noted that the previously placed 8 French right-sided nephrostomy to have retracted at the skin exit site at the level of a StatLock device. Fluoroscopy and contrast injection confirms retraction of the catheter into a lower pole calyx. The tube was exchanged and upsized for a 10 French nephrostomy tube which was formed in the central collecting system. The tube could not be completely advanced into the renal pelvis. IMPRESSION: 1. Placement of new 34 French left-sided nephrostomy tube to treat progressive hydronephrosis. The tube was formed in the renal pelvis and connected to a gravity drainage bag. 2. Exchange of partially retracted previously placed right-sided nephrostomy tube. A new 10 French nephrostomy tube was advanced into the central collecting system and attached to a gravity drainage bag. Electronically Signed   By: Aletta Edouard M.D.   On: 12/13/2015 17:29   Ir  Nephroureteral Cath Place Right  12/11/2015  INDICATION: Right hydronephrosis secondary to ureteral obstruction. EXAM: IR NEPHROURETERAL CATH PLACEMENT RIGHT; ULTRAOUND INTRAOPERATIVE - NRPT [facility] COMPARISON:  CT scan of October 24, 2015. MEDICATIONS: 1 g Ancef IV; The antibiotic was administered in an appropriate time frame prior to skin puncture. ANESTHESIA/SEDATION: Fentanyl 50 mcg IV; Versed 2.0 mg IV Moderate Sedation Time:  15 minutes. The patient was continuously monitored during the procedure by the interventional radiology nurse under my direct supervision. CONTRAST:  76m OMNIPAQUE IOHEXOL 300 MG/ML SOLN - administered into the collecting system(s) FLUOROSCOPY TIME:  Fluoroscopy Time: 2 minutes 30 seconds (391.25 mGy). COMPLICATIONS: None immediate. PROCEDURE: Informed written  consent was obtained from the patient after a thorough discussion of the procedural risks, benefits and alternatives. All questions were addressed. Maximal Sterile Barrier Technique was utilized including caps, mask, sterile gowns, sterile gloves, sterile drape, hand hygiene and skin antiseptic. A timeout was performed prior to the initiation of the procedure. Patient was placed prone on angiographic table. Right flank region was prepped and  draped using sterile technique. Local anesthetic was applied. Under real-time ultrasound guidance, 21 gauge needle was directed into dilated right intrarenal collecting system. Contrast under fluoroscopy was injected for antegrade nephrostogram, which demonstrates severe right hydronephrosis. Guidewire was placed under fluoroscopic guidance, followed by 8 [place] dilator. Then, 8.5 French nephrostomy catheter was passed over the wire, and its internal loop was internally secured. Under fluoroscopy, contrast was injected to confirm its position within the right renal pelvis. Dilatation of the right ureter was noted to the level of the sacroiliac joint, where it appears to be occluded. The external portion of the catheter was secured and hooked up to external drainage. IMPRESSION: Under ultrasound and fluoroscopic guidance, successful placement of 8.5 French nephrostomy catheter into right renal pelvis. This was hooked up to external drainage. Electronically Signed   By: Marijo Conception, M.D.   On: 12/11/2015 08:02   Ir Nephrostomy Exchange Left  01/04/2016  INDICATION: History of cervical Moreno with ureteral obstruction and need for bilateral nephrostomy catheter exchange. EXAM: IR EXCHANGE NEPHROSTOMY RIGHT; IR EXCHANGE NEPHROSTOMY LEFT COMPARISON:  None. MEDICATIONS: Ancef 1 gm IV; The antibiotic was administered in an appropriate time frame prior to skin puncture. ANESTHESIA/SEDATION: None CONTRAST:  39m OMNIPAQUE IOHEXOL 300 MG/ML SOLN - administered into the collecting  system(s) FLUOROSCOPY TIME:  Fluoroscopy Time: 1 minutes 48 seconds (175.9mGy COMPLICATIONS: None immediate. PROCEDURE: Informed written consent was obtained from the patient and her family after a thorough discussion of the procedural risks, benefits and alternatives. All questions were addressed. Maximal Sterile Barrier Technique was utilized including caps, mask, sterile gowns, sterile gloves, sterile drape, hand hygiene and skin antiseptic. A timeout was performed prior to the initiation of the procedure. Utilizing 1% xylocaine as local anesthetic and fluoroscopic guidance a guidewire was advanced into the nephrostomy catheter on the left and the catheter was removed. A new 10 French nephrostomy catheter was placed over the wiring coiled within the renal collecting system without difficulty. Contrast was injected to confirm catheter position. The catheter was then sutured into place utilizing a Hopkins buttress and placed to an external drainage bag. Attention was then turned to the right in utilizing a similar technique to the right nephrostomy catheter was exchanged. Catheter position was confirmed utilizing a contrast injection. The patient tolerated the procedure well and was discharged home after short observation period. IMPRESSION: Bilateral exchange of nephrostomy catheters as described. Electronically Signed   By: MInez CatalinaM.D.   On: 01/04/2016 14:32   Ir Nephrostomy Exchange Right  01/04/2016  INDICATION: History of cervical Moreno with ureteral obstruction and need for bilateral nephrostomy catheter exchange. EXAM: IR EXCHANGE NEPHROSTOMY RIGHT; IR EXCHANGE NEPHROSTOMY LEFT COMPARISON:  None. MEDICATIONS: Ancef 1 gm IV; The antibiotic was administered in an appropriate time frame prior to skin puncture. ANESTHESIA/SEDATION: None CONTRAST:  266mOMNIPAQUE IOHEXOL 300 MG/ML SOLN - administered into the collecting system(s) FLUOROSCOPY TIME:  Fluoroscopy Time: 1 minutes 48 seconds (1216.3Gy  COMPLICATIONS: None immediate. PROCEDURE: Informed written consent was obtained from the patient and her family after a thorough discussion of the procedural risks, benefits and alternatives. All questions were addressed. Maximal Sterile Barrier Technique was utilized including caps, mask, sterile gowns, sterile gloves, sterile drape, hand hygiene and skin antiseptic. A timeout was performed prior to the initiation of the procedure. Utilizing 1% xylocaine as local anesthetic and fluoroscopic guidance a guidewire was advanced into the nephrostomy catheter on the left and the catheter was removed. A new 10 French nephrostomy catheter  was placed over the wiring coiled within the renal collecting system without difficulty. Contrast was injected to confirm catheter position. The catheter was then sutured into place utilizing a Hopkins buttress and placed to an external drainage bag. Attention was then turned to the right in utilizing a similar technique to the right nephrostomy catheter was exchanged. Catheter position was confirmed utilizing a contrast injection. The patient tolerated the procedure well and was discharged home after short observation period. IMPRESSION: Bilateral exchange of nephrostomy catheters as described. Electronically Signed   By: Inez Catalina M.D.   On: 01/04/2016 14:32   Ir Nephrostomy Exchange Right  12/13/2015  CLINICAL DATA:  Bladder tumor and status post right-sided percutaneous nephrostomy tube placement on 12/10/2015. There remains oliguric renal failure and repeat renal ultrasound on 12/12/2015 revealed progressive left hydronephrosis and the patient now requires a left percutaneous nephrostomy tube. EXAM: 1. ULTRASOUND GUIDANCE FOR PUNCTURE OF THE LEFT RENAL COLLECTING SYSTEM. 2. LEFT PERCUTANEOUS NEPHROSTOMY TUBE PLACEMENT. 3. EXCHANGE OF RIGHT PERCUTANEOUS NEPHROSTOMY TUBE. COMPARISON:  Imaging on 12/10/2015 ANESTHESIA/SEDATION: 2.5 mg IV Versed; 75 mcg IV Fentanyl. Total Moderate  Sedation Time 30 minutes The patient's level of consciousness and physiologic status were continuously monitored during the procedure by Radiology nursing. CONTRAST:  15 ml Omnipaque 300 MEDICATIONS: 400 mg IV Cipro. Ciprofloxacin was given within two hours of incision. FLUOROSCOPY TIME:  1 minute. PROCEDURE: The procedure, risks, benefits, and alternatives were explained to the patient. Questions regarding the procedure were encouraged and answered. The patient understands and consents to the procedure. A time-out was performed prior to the procedure. The left flank region was prepped with chlorhexidine in a sterile fashion, and a sterile drape was applied covering the operative field. A sterile gown and sterile gloves were used for the procedure. Local anesthesia was provided with 1% Lidocaine. Ultrasound was used to localize the left kidney. Under direct ultrasound guidance, a 21 gauge needle was advanced into the renal collecting system. Ultrasound image documentation was performed. Aspiration of urine sample was performed followed by contrast injection. A transitional dilator was advanced over a guidewire. Percutaneous tract dilatation was then performed over the guidewire. A 10 -French percutaneous nephrostomy tube was then advanced and formed in the collecting system. Catheter position was confirmed by fluoroscopy after contrast injection. The catheter was secured at the skin with a Prolene retention suture and Stat-Lock device. A gravity bag was placed. The pre-existing right nephrostomy tube was prepped with chlorhexidine. Contrast injection was performed under fluoroscopy. The catheter was then cut and removed over a guidewire. A new 10 French nephrostomy tube was then advanced into the collecting system over the guidewire. The tube was formed and connected to a gravity drainage bag. The catheter was secured at the skin with a Prolene retention suture and StatLock device. COMPLICATIONS: None. FINDINGS:  Ultrasound confirms persistent moderate left-sided hydronephrosis. After lower pole access, a 10 French nephrostomy tube was placed and formed in the renal pelvis. Urine return was blood-tinged after tube placement. It was noted that the previously placed 8 French right-sided nephrostomy to have retracted at the skin exit site at the level of a StatLock device. Fluoroscopy and contrast injection confirms retraction of the catheter into a lower pole calyx. The tube was exchanged and upsized for a 10 French nephrostomy tube which was formed in the central collecting system. The tube could not be completely advanced into the renal pelvis. IMPRESSION: 1. Placement of new 79 French left-sided nephrostomy tube to treat progressive hydronephrosis. The tube  was formed in the renal pelvis and connected to a gravity drainage bag. 2. Exchange of partially retracted previously placed right-sided nephrostomy tube. A new 10 French nephrostomy tube was advanced into the central collecting system and attached to a gravity drainage bag. Electronically Signed   By: Aletta Edouard M.D.   On: 12/13/2015 17:29    ASSESSMENT: Carcinoma of Tonya.  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 PET scan has been reviewed independently and reviewed with the patient and family. Patient will benefit from local radiation therapy for control of symptoms but certainly would need systemic therapy to control disease in the liver. Used for plan of treatment is cis-platinum with radiation but I doubt patient will able to tolerate cis-platinum because a previous high creatinine.  In considering patient's metastases to the liver combination of carboplatinum and Taxol along with radiation followed by full dose of carboplatinum and Taxol might be beneficial to this patient.  2.  Proceed with port placement and chemotherapy class 3.  Overall prognosis and palliative  approach has been discussed with the family Considering stage IV disease treatments are not curative that has been discussed 4.  Intent of chemotherapy is palliation radius side effects will be discussing chemotherapy class and I also discussed some of the side effects Discuss situation with Dr. Baruch Gouty today as well as case was discussed in tumor conference 5.  Bilateral percutaneous nephrostomy being taken care by Dr. Hollice Espy, urologist All lab data has been reviewed.  We will start patient on carboplatinum and Taxol. Informed consent has been obtained.  Next week in my absence patient would be evaluated by Dr. Jacinto Reap.  Infusion reaction I was called in infusion center because patient got dizzy and extremely headedness.  Blood pressure dropped after Taxol was started. I examined the patient and by that time patient had Benadryl and Solu-Cortef and started improving blood pressure improved patient started feeling better there was no rash.  No rhonchi.  No tachycardia. We might be dealing with either reaction to Decadron versus Taxol Considering elderly lady with decided to discontinue Taxol and will be replaced with Abraxane with lower dose of steroid.  Significant amount of time was spent at patient's bedside managing infusion reaction Patient expressed understanding and was in agreement with this plan. She also understands that She can call clinic at any time with any questions, concerns, or complaints.    Carcinoma of Tonya, stage 4 ([place])   Staging form: Tonya Uteri, AJCC 7th Edition     Clinical: FIGO Stage IVB (T4, N1, M1) - Signed by Forest Gleason, MD on 12/31/2015   Forest Gleason, MD   01/07/2016 10:42 AM

## 2016-01-07 NOTE — Addendum Note (Signed)
Addended by: Forest Gleason on: 01/07/2016 08:18 PM   Modules accepted: Level of Service

## 2016-01-07 NOTE — Progress Notes (Signed)
Carboplatin dose ordered was 137mg , which computer rounded up to 140mg . This was based on SCr of 0.91. SCr today is 1.12, which calculates to 128mg . Called and spoke with Dr Oliva Bustard who stated it was ok to reduce dose to within 10% of calculation. Dose was reduced to 130mg .

## 2016-01-08 ENCOUNTER — Ambulatory Visit: Payer: Medicare Other

## 2016-01-08 ENCOUNTER — Ambulatory Visit: Payer: Medicare Other | Admitting: Oncology

## 2016-01-08 ENCOUNTER — Encounter: Payer: Self-pay | Admitting: Urology

## 2016-01-08 ENCOUNTER — Telehealth: Payer: Self-pay | Admitting: *Deleted

## 2016-01-08 ENCOUNTER — Ambulatory Visit
Admission: RE | Admit: 2016-01-08 | Discharge: 2016-01-08 | Disposition: A | Discharge status: Home / Self Care | Payer: Medicare Other | Source: Ambulatory Visit | Attending: Radiation Oncology | Admitting: Radiation Oncology

## 2016-01-08 ENCOUNTER — Ambulatory Visit (INDEPENDENT_AMBULATORY_CARE_PROVIDER_SITE_OTHER): Payer: Medicare Other | Admitting: Urology

## 2016-01-08 ENCOUNTER — Telehealth: Payer: Self-pay

## 2016-01-08 VITALS — BP 135/73 | HR 56 | Ht 64.0 in | Wt 132.0 lb

## 2016-01-08 DIAGNOSIS — Z936 Other artificial openings of urinary tract status: Secondary | ICD-10-CM | POA: Diagnosis not present

## 2016-01-08 DIAGNOSIS — Z510 Encounter for antineoplastic radiation therapy: Secondary | ICD-10-CM | POA: Diagnosis not present

## 2016-01-08 NOTE — Telephone Encounter (Signed)
Pt's daughter stated noticed there is blood-tinged urine in left leg bag connected to nephrostomy tube. States had tube exchange last Friday. Chemotherapy was given yesterday. Per Dr. Oliva Bustard, chemo is not the cause for blood-tinged urine and pt needs to advise urologist for further evaluation. Pt instructed to call Dr. Cherrie Gauze office for further instructions. Offered to set up an appt for pt to be seen by Dr. Erlene Quan but pt's daughter stated would like to call her office first.

## 2016-01-08 NOTE — Telephone Encounter (Signed)
The daughter in law, Wells Guiles, is concerned that it is just blood draining into the tube.  So, I offered for her to come in and I'll take a look and hopefully reassure her.

## 2016-01-08 NOTE — Telephone Encounter (Signed)
Pt daughter in law, Wells Guiles, called stating pt is having blood in neph tube. Wells Guiles described the blood to be bright red. Per Wells Guiles neph tube is continuing to drain. Please advise.

## 2016-01-08 NOTE — Progress Notes (Signed)
01/08/2016 11:22 PM   Tonya Moreno, Tonya Moreno [id number]  Referring provider: Letta Median, MD [place], [place] [id number]  Chief Complaint  Patient presents with  . Follow-up    HPI: Patient is a 82 year old African-American female who is status post placement of bilateral nephrostomy tubes for obstructive uropathy resulting in acute renal failure who was daughter-in-law, Tonya Moreno, his request seen in urgent appointment for blood in the nephrostomy tube.  [name] states that she underwent chemotherapy treatment yesterday and this morning she noted that the drainage bag for the nephrostomy tube contained blood.  She was contacted by phone and reassured that this is a normal finding, but she wanted her mother-in-law to be seen for reassurance.  Patient states she is fatigued, but she denies fevers, chills, nausea or vomiting.  Examining both of the drainage bags for the nephrostomy tubes, each are currently draining clear yellow urine. The insertion sites are clean and dry. There is no erythema located at the insertion sites.  PMH: Past Medical History  Diagnosis Date  . Hypertension   . Shortness of breath dyspnea     doe  . Arthritis   . Complication of anesthesia     tonsillectomy age 73 bad experience with mask and hard to wake up  . Enlarged kidney   . Hernia of abdominal wall 12/04/2015    left side  . Acute kidney failure ([place]) 12/11/2015  . Carcinoma of cervix, stage 4 ([place]) 12/31/2015    Surgical History: Past Surgical History  Procedure Laterality Date  . Tonsillectomy    . Cystoscopy w/ retrogrades Left 12/10/2015    Procedure: CYSTOSCOPY WITH RETROGRADE PYELOGRAM;  Surgeon: Hollice Espy, MD;  Location: [facility];  Service: Urology;  Laterality: Left;  . Transurethral resection of bladder tumor N/A 12/10/2015    Procedure: TRANSURETHRAL RESECTION OF BLADDER TUMOR (TURBT);  Surgeon: Hollice Espy, MD;   Location: [facility];  Service: Urology;  Laterality: N/A;  . Peripheral vascular catheterization N/A 01/03/2016    Procedure: Porta Cath Insertion;  Surgeon: Algernon Huxley, MD;  Location: [facility];  Service: Cardiovascular;  Laterality: N/A;    Home Medications:    Medication List       This list is accurate as of: 01/08/16 11:59 PM.  Always use your most recent med list.               amLODipine 10 MG tablet  Commonly known as:  NORVASC  Take 10 mg by mouth daily. After lunch     aspirin 81 MG tablet  Take 81 mg by mouth every morning.     Cholecalciferol 4000 units Caps  Take 4,000 Units by mouth daily. Reported on 01/02/2016     Vitamin D3 50000 units Caps  Take 1 capsule by mouth once a week. Reported on 01/02/2016     dexamethasone 4 MG tablet  Commonly known as:  DECADRON  take 2 tablets by mouth THE NIGHT BEFORE CHEMOTHERAPY ONLY     RA COL-RITE 100 MG capsule  Generic drug:  docusate sodium  Take by mouth. Reported on 01/02/2016     docusate sodium 100 MG capsule  Commonly known as:  COLACE  Take 2 capsules (200 mg total) by mouth 2 (two) times daily.     lidocaine-prilocaine cream  Commonly known as:  EMLA  Apply 1 application topically as needed.     ondansetron 4 MG tablet  Commonly known as:  ZOFRAN  take 1 tablet by mouth every 6 hours if needed     quinapril 20 MG tablet  Commonly known as:  ACCUPRIL  Take 20 mg by mouth 2 (two) times daily.        Allergies:  Allergies  Allergen Reactions  . Hydralazine Palpitations  . Taxol [Paclitaxel] Other (See Comments)    Shortness of breath, facial flushing, dizziness.     Family History: Family History  Problem Relation Age of Onset  . Cancer Mother   . Prostate cancer Neg Hx   . Bladder Cancer Neg Hx   . Kidney cancer Neg Hx     Social History:  reports that she has never smoked. She has never used smokeless tobacco. She reports that she does not drink alcohol or use illicit  drugs.  ROS: UROLOGY Frequent Urination?: No Hard to postpone urination?: No Burning/pain with urination?: No Get up at night to urinate?: No Leakage of urine?: Yes Urine stream starts and stops?: No Trouble starting stream?: No Do you have to strain to urinate?: No Blood in urine?: No Urinary tract infection?: No Sexually transmitted disease?: No Injury to kidneys or bladder?: No Painful intercourse?: No Weak stream?: No Currently pregnant?: No Vaginal bleeding?: No Last menstrual period?: n  Gastrointestinal Nausea?: No Vomiting?: No Indigestion/heartburn?: No Diarrhea?: No Constipation?: No  Constitutional Fever: No Night sweats?: No Weight loss?: No Fatigue?: No  Skin Skin rash/lesions?: No Itching?: No  Eyes Blurred vision?: No Double vision?: No  Ears/Nose/Throat Sore throat?: No Sinus problems?: No  Hematologic/Lymphatic Swollen glands?: No Easy bruising?: No  Cardiovascular Leg swelling?: Yes Chest pain?: No  Respiratory Cough?: No Shortness of breath?: Yes  Endocrine Excessive thirst?: No  Musculoskeletal Back pain?: No Joint pain?: No  Neurological Headaches?: No Dizziness?: No  Psychologic Depression?: No Anxiety?: No  Physical Exam: BP 135/73 mmHg  Pulse 56  Ht 5\' 4"  (1.626 m)  Wt 132 lb (59.875 kg)  BMI 22.65 kg/m2  Constitutional: Well nourished. Alert and oriented, No acute distress. HEENT: [place] AT, moist mucus membranes. Trachea midline, no masses. Cardiovascular: No clubbing, cyanosis, or edema. Respiratory: Normal respiratory effort, no increased work of breathing. GI: Abdomen is soft, non tender, non distended, no abdominal masses. Liver and spleen not palpable.  No hernias appreciated.  Stool sample for occult testing is not indicated.   GU: No CVA tenderness.  No bladder fullness or masses.  Nephrostomy tubes are in place. Draining clear yellow urine. Skin: No rashes, bruises or suspicious lesions. Lymph: No  cervical or inguinal adenopathy. Neurologic: Grossly intact, no focal deficits, moving all 4 extremities. Psychiatric: Normal mood and affect.  Laboratory Data: Lab Results  Component Value Date   WBC 5.4 12/31/2015   HGB 10.4* 12/31/2015   HCT 31.1* 12/31/2015   MCV 87.2 12/31/2015   PLT 284 12/31/2015    Lab Results  Component Value Date   CREATININE 1.12* 12/31/2015    Lab Results  Component Value Date   AST 22 12/31/2015   Lab Results  Component Value Date   ALT 9* 12/31/2015     Assessment & Plan:    1. Management of nephrostomy tubes:   Patient and patient's daughter-in-law have been reassured that the nephrostomy tubes are in the correct position and draining well.  I did explain that she would see blood in the urine from time to time and this is to be expected with nephrostomy tubes.  I reemphasized the importance of drinking plenty of water  as she admitted that she had not been drinking a lot of fluids last 2 days.  If she sits start feeling lightheaded, weak or any other worrisome symptoms she should contact our office or seek treatment in the emergency room.  She is to keep her scheduled follow-up appointment with Dr. Erlene Quan in 6 weeks.   Return in about 6 weeks (around 02/19/2016) for with Dr. Erlene Quan.  These notes generated with voice recognition software. I apologize for typographical errors.  Zara Council, [facility] [address] [place], [place] [address] [phone]

## 2016-01-08 NOTE — Telephone Encounter (Signed)
Please let her know that these tubes can bleed intermittently with manipulation or activity. She should drink plenty of water. If the tube stops draining, they will need to be flushed.  If she is feeling lightheaded, weak, or any other worrisome symptoms, she call our office or go to the emergency room.  Typically, blood in the urine can look quite bad even if minimal.    Hollice Espy, MD

## 2016-01-09 ENCOUNTER — Ambulatory Visit
Admission: RE | Admit: 2016-01-09 | Discharge: 2016-01-09 | Disposition: A | Discharge status: Home / Self Care | Payer: Medicare Other | Source: Ambulatory Visit | Attending: Radiation Oncology | Admitting: Radiation Oncology

## 2016-01-09 ENCOUNTER — Telehealth: Payer: Self-pay | Admitting: *Deleted

## 2016-01-09 DIAGNOSIS — Z510 Encounter for antineoplastic radiation therapy: Secondary | ICD-10-CM | POA: Diagnosis not present

## 2016-01-09 MED ORDER — PREDNISONE 20 MG PO TABS: 20.0000 mg | ORAL_TABLET | Freq: Every day | ORAL | Status: DC

## 2016-01-09 NOTE — Telephone Encounter (Signed)
Per Magda Paganini C in infusion, pt's daughter-in-law stated pt has decreased appetite and wants to know if anything can be done to help with that. Per Dr. Oliva Bustard, may call in prednisone 20mg  daily to help increase appetite. Medication has been called into [facility].

## 2016-01-10 ENCOUNTER — Ambulatory Visit
Admission: RE | Admit: 2016-01-10 | Discharge: 2016-01-10 | Disposition: A | Discharge status: Home / Self Care | Payer: Medicare Other | Source: Ambulatory Visit | Attending: Radiation Oncology | Admitting: Radiation Oncology

## 2016-01-10 DIAGNOSIS — Z510 Encounter for antineoplastic radiation therapy: Secondary | ICD-10-CM | POA: Diagnosis not present

## 2016-01-11 ENCOUNTER — Ambulatory Visit
Admission: RE | Admit: 2016-01-11 | Discharge: 2016-01-11 | Disposition: A | Discharge status: Home / Self Care | Payer: Medicare Other | Source: Ambulatory Visit | Attending: Radiation Oncology | Admitting: Radiation Oncology

## 2016-01-11 DIAGNOSIS — Z510 Encounter for antineoplastic radiation therapy: Secondary | ICD-10-CM | POA: Diagnosis not present

## 2016-01-14 ENCOUNTER — Inpatient Hospital Stay (HOSPITAL_BASED_OUTPATIENT_CLINIC_OR_DEPARTMENT_OTHER): Payer: Medicare Other | Admitting: Internal Medicine

## 2016-01-14 ENCOUNTER — Inpatient Hospital Stay: Payer: Medicare Other

## 2016-01-14 ENCOUNTER — Ambulatory Visit
Admission: RE | Admit: 2016-01-14 | Discharge: 2016-01-14 | Disposition: A | Discharge status: Home / Self Care | Payer: Medicare Other | Source: Ambulatory Visit | Attending: Radiation Oncology | Admitting: Radiation Oncology

## 2016-01-14 ENCOUNTER — Encounter: Payer: Self-pay | Admitting: Internal Medicine

## 2016-01-14 VITALS — BP 107/60 | HR 55 | Temp 96.4°F | Resp 18 | Ht 64.0 in | Wt 130.1 lb

## 2016-01-14 DIAGNOSIS — C539 Malignant neoplasm of cervix uteri, unspecified: Secondary | ICD-10-CM

## 2016-01-14 DIAGNOSIS — Z809 Family history of malignant neoplasm, unspecified: Secondary | ICD-10-CM

## 2016-01-14 DIAGNOSIS — R630 Anorexia: Secondary | ICD-10-CM

## 2016-01-14 DIAGNOSIS — J90 Pleural effusion, not elsewhere classified: Secondary | ICD-10-CM

## 2016-01-14 DIAGNOSIS — C787 Secondary malignant neoplasm of liver and intrahepatic bile duct: Secondary | ICD-10-CM

## 2016-01-14 DIAGNOSIS — Z79899 Other long term (current) drug therapy: Secondary | ICD-10-CM

## 2016-01-14 DIAGNOSIS — R002 Palpitations: Secondary | ICD-10-CM

## 2016-01-14 DIAGNOSIS — Z5111 Encounter for antineoplastic chemotherapy: Secondary | ICD-10-CM | POA: Diagnosis not present

## 2016-01-14 DIAGNOSIS — R0600 Dyspnea, unspecified: Secondary | ICD-10-CM

## 2016-01-14 DIAGNOSIS — N1330 Unspecified hydronephrosis: Secondary | ICD-10-CM

## 2016-01-14 DIAGNOSIS — N179 Acute kidney failure, unspecified: Secondary | ICD-10-CM

## 2016-01-14 DIAGNOSIS — J432 Centrilobular emphysema: Secondary | ICD-10-CM

## 2016-01-14 DIAGNOSIS — Z780 Asymptomatic menopausal state: Secondary | ICD-10-CM

## 2016-01-14 DIAGNOSIS — M1611 Unilateral primary osteoarthritis, right hip: Secondary | ICD-10-CM

## 2016-01-14 DIAGNOSIS — N820 Vesicovaginal fistula: Secondary | ICD-10-CM

## 2016-01-14 DIAGNOSIS — R54 Age-related physical debility: Secondary | ICD-10-CM

## 2016-01-14 DIAGNOSIS — Z7982 Long term (current) use of aspirin: Secondary | ICD-10-CM

## 2016-01-14 DIAGNOSIS — I517 Cardiomegaly: Secondary | ICD-10-CM

## 2016-01-14 DIAGNOSIS — I10 Essential (primary) hypertension: Secondary | ICD-10-CM

## 2016-01-14 DIAGNOSIS — R590 Localized enlarged lymph nodes: Secondary | ICD-10-CM

## 2016-01-14 DIAGNOSIS — D259 Leiomyoma of uterus, unspecified: Secondary | ICD-10-CM

## 2016-01-14 DIAGNOSIS — Z510 Encounter for antineoplastic radiation therapy: Secondary | ICD-10-CM | POA: Diagnosis not present

## 2016-01-14 LAB — COMPREHENSIVE METABOLIC PANEL
ALBUMIN: 3.2 g/dL — AB (ref 3.5–5.0)
ALT: 16 U/L (ref 14–54)
ANION GAP: 7 (ref 5–15)
AST: 32 U/L (ref 15–41)
Alkaline Phosphatase: 115 U/L (ref 38–126)
BUN: 23 mg/dL — AB (ref 6–20)
CHLORIDE: 97 mmol/L — AB (ref 101–111)
CO2: 26 mmol/L (ref 22–32)
Calcium: 9.8 mg/dL (ref 8.9–10.3)
Creatinine, Ser: 1.03 mg/dL — ABNORMAL HIGH (ref 0.44–1.00)
GFR calc Af Amer: 58 mL/min — ABNORMAL LOW (ref >60)
GFR calc non Af Amer: 50 mL/min — ABNORMAL LOW (ref >60)
GLUCOSE: 190 mg/dL — AB (ref 65–99)
POTASSIUM: 3.8 mmol/L (ref 3.5–5.1)
Sodium: 130 mmol/L — ABNORMAL LOW (ref 135–145)
Total Bilirubin: 0.4 mg/dL (ref 0.3–1.2)
Total Protein: 6.9 g/dL (ref 6.5–8.1)

## 2016-01-14 LAB — CBC WITH DIFFERENTIAL/PLATELET
BASOS ABS: 0 10*3/uL (ref 0–0.1)
BASOS PCT: 0 %
EOS ABS: 0 10*3/uL (ref 0–0.7)
EOS PCT: 0 %
HCT: 29.2 % — ABNORMAL LOW (ref 35.0–47.0)
HEMOGLOBIN: 9.9 g/dL — AB (ref 12.0–16.0)
Lymphocytes Relative: 6 %
Lymphs Abs: 0.4 10*3/uL — ABNORMAL LOW (ref 1.0–3.6)
MCH: 29.3 pg (ref 26.0–34.0)
MCHC: 34 g/dL (ref 32.0–36.0)
MCV: 86.2 fL (ref 80.0–100.0)
Monocytes Absolute: 0.4 10*3/uL (ref 0.2–0.9)
Monocytes Relative: 6 %
NEUTROS PCT: 88 %
Neutro Abs: 6.7 10*3/uL — ABNORMAL HIGH (ref 1.4–6.5)
PLATELETS: 227 10*3/uL (ref 150–440)
RBC: 3.38 MIL/uL — AB (ref 3.80–5.20)
RDW: 13.7 % (ref 11.5–14.5)
WBC: 7.6 10*3/uL (ref 3.6–11.0)

## 2016-01-14 LAB — MAGNESIUM: MAGNESIUM: 2.2 mg/dL (ref 1.7–2.4)

## 2016-01-14 MED ORDER — PACLITAXEL PROTEIN-BOUND CHEMO INJECTION 100 MG
50.0000 mg/m2 | Freq: Once | INTRAVENOUS | Status: AC
  Administered 2016-01-14: 75 mg via INTRAVENOUS
  Filled 2016-01-14: qty 15

## 2016-01-14 MED ORDER — HEPARIN SOD (PORK) LOCK FLUSH 100 UNIT/ML IV SOLN
500.0000 [IU] | Freq: Once | INTRAVENOUS | Status: AC | PRN
  Administered 2016-01-14: 500 [IU]
  Filled 2016-01-14: qty 5

## 2016-01-14 MED ORDER — PALONOSETRON HCL INJECTION 0.25 MG/5ML
0.2500 mg | Freq: Once | INTRAVENOUS | Status: AC
  Administered 2016-01-14: 0.25 mg via INTRAVENOUS
  Filled 2016-01-14: qty 5

## 2016-01-14 MED ORDER — SODIUM CHLORIDE 0.9 % IV SOLN
4.0000 mg | Freq: Once | INTRAVENOUS | Status: AC
  Administered 2016-01-14: 4 mg via INTRAVENOUS
  Filled 2016-01-14: qty 0.4

## 2016-01-14 MED ORDER — SODIUM CHLORIDE 0.9 % IV SOLN
130.0000 mg | Freq: Once | INTRAVENOUS | Status: AC
  Administered 2016-01-14: 130 mg via INTRAVENOUS
  Filled 2016-01-14: qty 13

## 2016-01-14 MED ORDER — SODIUM CHLORIDE 0.9 % IV SOLN
Freq: Once | INTRAVENOUS | Status: AC
  Administered 2016-01-14: 13:00:00 via INTRAVENOUS
  Filled 2016-01-14: qty 1000

## 2016-01-14 NOTE — Progress Notes (Signed)
Patient received one dose of carbo/taxol for cervical cancer with a taxol dose of 45mg /m2 with radiation. Taxol was changed to abraxane at dose of 50mg /m2 in combination with [place] and radiation.  Called and verified dose of abraxane with Dr Rogue Bussing and he would like to continue current 50mg /m2 dosing due to combination radiation therapy.

## 2016-01-14 NOTE — Progress Notes (Signed)
[facility] OFFICE PROGRESS NOTE  Patient Care Team: Abby Cindy Hazy, MD as PCP - General Clent Jacks, RN as Registered Nurse Hollice Espy, MD as Consulting Physician (Urology)   SUMMARY OF ONCOLOGIC HISTORY:  # FEB 2017- metastatic cervical cancer- concurrent chemoradiation/local control  INTERVAL HISTORY:  82 year old female patient with above history of metastatic cervical cancer status post bilateral nephrostomy tubes; currently on chemotherapy with carboplatin and Abraxane along with radiation for local control.  Patient continues to complain of poor appetite. She was started on prednisone approximately 4 days ago. Otherwise denies any nausea vomiting. Denies any weight loss. Denies any vaginal discharge or bleeding.    Patient however continues to feel weak.  She denies any pain. Denies any diarrhea or any tingling and numbness.  REVIEW OF SYSTEMS:  A complete 10 point review of system is done which is negative except mentioned above/history of present illness.   PAST MEDICAL HISTORY :  Past Medical History  Diagnosis Date  . Hypertension   . Shortness of breath dyspnea     doe  . Arthritis   . Complication of anesthesia     tonsillectomy age 81 bad experience with mask and hard to wake up  . Enlarged kidney   . Hernia of abdominal wall 12/04/2015    left side  . Acute kidney failure ([place]) 12/11/2015  . Carcinoma of cervix, stage 4 ([place]) 12/31/2015    PAST SURGICAL HISTORY :   Past Surgical History  Procedure Laterality Date  . Tonsillectomy    . Cystoscopy w/ retrogrades Left 12/10/2015    Procedure: CYSTOSCOPY WITH RETROGRADE PYELOGRAM;  Surgeon: Hollice Espy, MD;  Location: [facility];  Service: Urology;  Laterality: Left;  . Transurethral resection of bladder tumor N/A 12/10/2015    Procedure: TRANSURETHRAL RESECTION OF BLADDER TUMOR (TURBT);  Surgeon: Hollice Espy, MD;  Location: [facility];  Service: Urology;  Laterality: N/A;  . Peripheral  vascular catheterization N/A 01/03/2016    Procedure: Porta Cath Insertion;  Surgeon: Algernon Huxley, MD;  Location: [facility];  Service: Cardiovascular;  Laterality: N/A;    FAMILY HISTORY :   Family History  Problem Relation Age of Onset  . Cancer Mother   . Prostate cancer Neg Hx   . Bladder Cancer Neg Hx   . Kidney cancer Neg Hx     SOCIAL HISTORY:   Social History  Substance Use Topics  . Smoking status: Never Smoker   . Smokeless tobacco: Never Used  . Alcohol Use: No    ALLERGIES:  is allergic to hydralazine and taxol.  MEDICATIONS:  Current Outpatient Prescriptions  Medication Sig Dispense Refill  . aspirin 81 MG tablet Take 81 mg by mouth every morning.     Marland Kitchen dexamethasone (DECADRON) 4 MG tablet take 2 tablets by mouth THE NIGHT BEFORE CHEMOTHERAPY ONLY  0  . docusate sodium (COLACE) 100 MG capsule Take 2 capsules (200 mg total) by mouth 2 (two) times daily. 120 capsule 0  . ondansetron (ZOFRAN) 4 MG tablet take 1 tablet by mouth every 6 hours if needed  0  . predniSONE (DELTASONE) 20 MG tablet Take 1 tablet (20 mg total) by mouth daily with breakfast. 30 tablet 0  . quinapril (ACCUPRIL) 20 MG tablet Take 20 mg by mouth 2 (two) times daily.    Marland Kitchen amLODipine (NORVASC) 10 MG tablet Take 10 mg by mouth daily. After lunch    . Cholecalciferol (VITAMIN D3) 50000 units CAPS Take 1  capsule by mouth once a week. Reported on 01/14/2016    . Cholecalciferol 4000 UNITS CAPS Take 4,000 Units by mouth daily. Reported on 01/14/2016    . lidocaine-prilocaine (EMLA) cream Apply 1 application topically as needed. 30 g 3   No current facility-administered medications for this visit.    PHYSICAL EXAMINATION:   BP 107/60 mmHg  Pulse 55  Temp(Src) 96.4 F (35.8 C) (Tympanic)  Resp 18  Ht 5\' 4"  (1.626 m)  Wt 130 lb 1.1 oz (59 kg)  BMI 22.32 kg/m2  Filed Weights   01/14/16 1140  Weight: 130 lb 1.1 oz (59 kg)    GENERAL: Well-nourished well-developed; Alert, no distress  and comfortable.  Patient appears tired. She is in a wheelchair. Accompanied by daughter.  EYES: no pallor or icterus OROPHARYNX: no thrush or ulceration; good dentition  NECK: supple, no masses felt LYMPH:  no palpable lymphadenopathy in the cervical, axillary or inguinal regions LUNGS: clear to auscultation and  No wheeze or crackles HEART/CVS: regular rate & rhythm and no murmurs; No lower extremity edema ABDOMEN:abdomen soft, non-tender and normal bowel sounds Musculoskeletal:no cyanosis of digits and no clubbing  PSYCH: alert & oriented x 3 with fluent speech NEURO: no focal motor/sensory deficits SKIN:  no rashes or significant lesions; bil nephrostomy tubes.   LABORATORY DATA:  I have reviewed the data as listed    Component Value Date/Time   NA 130* 01/14/2016 1024   NA 138 12/20/2015 0933   K 3.8 01/14/2016 1024   CL 97* 01/14/2016 1024   CO2 26 01/14/2016 1024   GLUCOSE 190* 01/14/2016 1024   GLUCOSE 101* 12/20/2015 0933   BUN 23* 01/14/2016 1024   BUN 9 12/20/2015 0933   CREATININE 1.03* 01/14/2016 1024   CALCIUM 9.8 01/14/2016 1024   PROT 6.9 01/14/2016 1024   ALBUMIN 3.2* 01/14/2016 1024   AST 32 01/14/2016 1024   ALT 16 01/14/2016 1024   ALKPHOS 115 01/14/2016 1024   BILITOT 0.4 01/14/2016 1024   GFRNONAA 50* 01/14/2016 1024   GFRAA 58* 01/14/2016 1024    No results found for: SPEP, UPEP  Lab Results  Component Value Date   WBC 7.6 01/14/2016   NEUTROABS 6.7* 01/14/2016   HGB 9.9* 01/14/2016   HCT 29.2* 01/14/2016   MCV 86.2 01/14/2016   PLT 227 01/14/2016      Chemistry      Component Value Date/Time   NA 130* 01/14/2016 1024   NA 138 12/20/2015 0933   K 3.8 01/14/2016 1024   CL 97* 01/14/2016 1024   CO2 26 01/14/2016 1024   BUN 23* 01/14/2016 1024   BUN 9 12/20/2015 0933   CREATININE 1.03* 01/14/2016 1024      Component Value Date/Time   CALCIUM 9.8 01/14/2016 1024   ALKPHOS 115 01/14/2016 1024   AST 32 01/14/2016 1024   ALT 16  01/14/2016 1024   BILITOT 0.4 01/14/2016 1024        ASSESSMENT & PLAN:   # Metastatic cervical cancer- currently on palliative chemoradiation/ local control. Patient is currently status post 1 cycle. She tolerated chemotherapy well.  # Proceed with cycle #2 today. CBC- unremarkable except for hemoglobin 9.9/stable; Creatinine stable at 1.03.   # Poor appetite- no significant weight loss in the last 2 weeks. Patient is on prednisone. However if her appetite does not improve/start to lose weight can consider Megace.  # Urinary obstruction status post percutaneous nephrostomy tubes.  # Follow-up with Dr. Oliva Bustard in 1  week CBC CMP/chemotherapy.  # 25 minutes face-to-face with the patient discussing the above plan of care; more than 50% of time spent on prognosis/ natural history; counseling and coordination.     Cammie Sickle, MD 01/14/2016 12:00 PM

## 2016-01-14 NOTE — Progress Notes (Signed)
Pt here for chemo. Her week last wweek wen tok with chemo. She does not have appetite. She was put on prednisone late last week to see if it would help. She has about 12 BM a week which has been that way for a while. She drinks ensure 2 a day and ecouraged her to drink 3 a day since she states she is snacking  More than eating a meal. Her daughter states it is small child's portion. She has no pain. Urine color is yellow she states.

## 2016-01-15 ENCOUNTER — Ambulatory Visit
Admission: RE | Admit: 2016-01-15 | Discharge: 2016-01-15 | Disposition: A | Discharge status: Home / Self Care | Payer: Medicare Other | Source: Ambulatory Visit | Attending: Radiation Oncology | Admitting: Radiation Oncology

## 2016-01-15 DIAGNOSIS — Z510 Encounter for antineoplastic radiation therapy: Secondary | ICD-10-CM | POA: Diagnosis not present

## 2016-01-16 ENCOUNTER — Ambulatory Visit
Admission: RE | Admit: 2016-01-16 | Discharge: 2016-01-16 | Disposition: A | Discharge status: Home / Self Care | Payer: Medicare Other | Source: Ambulatory Visit | Attending: Radiation Oncology | Admitting: Radiation Oncology

## 2016-01-16 DIAGNOSIS — Z510 Encounter for antineoplastic radiation therapy: Secondary | ICD-10-CM | POA: Diagnosis not present

## 2016-01-17 ENCOUNTER — Ambulatory Visit
Admission: RE | Admit: 2016-01-17 | Discharge: 2016-01-17 | Disposition: A | Discharge status: Home / Self Care | Payer: Medicare Other | Source: Ambulatory Visit | Attending: Radiation Oncology | Admitting: Radiation Oncology

## 2016-01-17 DIAGNOSIS — Z510 Encounter for antineoplastic radiation therapy: Secondary | ICD-10-CM | POA: Diagnosis not present

## 2016-01-18 ENCOUNTER — Ambulatory Visit
Admission: RE | Admit: 2016-01-18 | Discharge: 2016-01-18 | Disposition: A | Discharge status: Home / Self Care | Payer: Medicare Other | Source: Ambulatory Visit | Attending: Radiation Oncology | Admitting: Radiation Oncology

## 2016-01-18 DIAGNOSIS — Z510 Encounter for antineoplastic radiation therapy: Secondary | ICD-10-CM | POA: Diagnosis not present

## 2016-01-21 ENCOUNTER — Inpatient Hospital Stay: Payer: Medicare Other

## 2016-01-21 ENCOUNTER — Encounter: Payer: Self-pay | Admitting: Oncology

## 2016-01-21 ENCOUNTER — Ambulatory Visit: Payer: Medicare Other

## 2016-01-21 ENCOUNTER — Ambulatory Visit: Payer: Medicare Other | Admitting: Oncology

## 2016-01-21 ENCOUNTER — Ambulatory Visit
Admission: RE | Admit: 2016-01-21 | Discharge: 2016-01-21 | Disposition: A | Discharge status: Home / Self Care | Payer: Medicare Other | Source: Ambulatory Visit | Attending: Radiation Oncology | Admitting: Radiation Oncology

## 2016-01-21 ENCOUNTER — Inpatient Hospital Stay (HOSPITAL_BASED_OUTPATIENT_CLINIC_OR_DEPARTMENT_OTHER): Payer: Medicare Other | Admitting: Oncology

## 2016-01-21 VITALS — BP 122/72 | HR 59 | Temp 96.7°F | Resp 18 | Wt 128.2 lb

## 2016-01-21 DIAGNOSIS — C787 Secondary malignant neoplasm of liver and intrahepatic bile duct: Secondary | ICD-10-CM

## 2016-01-21 DIAGNOSIS — N179 Acute kidney failure, unspecified: Secondary | ICD-10-CM

## 2016-01-21 DIAGNOSIS — R590 Localized enlarged lymph nodes: Secondary | ICD-10-CM

## 2016-01-21 DIAGNOSIS — J90 Pleural effusion, not elsewhere classified: Secondary | ICD-10-CM

## 2016-01-21 DIAGNOSIS — Z79899 Other long term (current) drug therapy: Secondary | ICD-10-CM

## 2016-01-21 DIAGNOSIS — Z809 Family history of malignant neoplasm, unspecified: Secondary | ICD-10-CM

## 2016-01-21 DIAGNOSIS — N1330 Unspecified hydronephrosis: Secondary | ICD-10-CM

## 2016-01-21 DIAGNOSIS — D259 Leiomyoma of uterus, unspecified: Secondary | ICD-10-CM

## 2016-01-21 DIAGNOSIS — R0600 Dyspnea, unspecified: Secondary | ICD-10-CM

## 2016-01-21 DIAGNOSIS — N820 Vesicovaginal fistula: Secondary | ICD-10-CM

## 2016-01-21 DIAGNOSIS — C539 Malignant neoplasm of cervix uteri, unspecified: Secondary | ICD-10-CM

## 2016-01-21 DIAGNOSIS — Z510 Encounter for antineoplastic radiation therapy: Secondary | ICD-10-CM | POA: Diagnosis not present

## 2016-01-21 DIAGNOSIS — M1611 Unilateral primary osteoarthritis, right hip: Secondary | ICD-10-CM

## 2016-01-21 DIAGNOSIS — R002 Palpitations: Secondary | ICD-10-CM

## 2016-01-21 DIAGNOSIS — R630 Anorexia: Secondary | ICD-10-CM | POA: Diagnosis not present

## 2016-01-21 DIAGNOSIS — Z780 Asymptomatic menopausal state: Secondary | ICD-10-CM

## 2016-01-21 DIAGNOSIS — R54 Age-related physical debility: Secondary | ICD-10-CM

## 2016-01-21 DIAGNOSIS — Z5111 Encounter for antineoplastic chemotherapy: Secondary | ICD-10-CM | POA: Diagnosis not present

## 2016-01-21 DIAGNOSIS — J432 Centrilobular emphysema: Secondary | ICD-10-CM

## 2016-01-21 DIAGNOSIS — Z7982 Long term (current) use of aspirin: Secondary | ICD-10-CM

## 2016-01-21 DIAGNOSIS — I517 Cardiomegaly: Secondary | ICD-10-CM

## 2016-01-21 DIAGNOSIS — I10 Essential (primary) hypertension: Secondary | ICD-10-CM

## 2016-01-21 LAB — COMPREHENSIVE METABOLIC PANEL
ALT: 23 U/L (ref 14–54)
ANION GAP: 6 (ref 5–15)
AST: 37 U/L (ref 15–41)
Albumin: 3 g/dL — ABNORMAL LOW (ref 3.5–5.0)
Alkaline Phosphatase: 121 U/L (ref 38–126)
BILIRUBIN TOTAL: 0.3 mg/dL (ref 0.3–1.2)
BUN: 25 mg/dL — ABNORMAL HIGH (ref 6–20)
CO2: 28 mmol/L (ref 22–32)
Calcium: 9.3 mg/dL (ref 8.9–10.3)
Chloride: 95 mmol/L — ABNORMAL LOW (ref 101–111)
Creatinine, Ser: 0.95 mg/dL (ref 0.44–1.00)
GFR, EST NON AFRICAN AMERICAN: 55 mL/min — AB (ref >60)
Glucose, Bld: 163 mg/dL — ABNORMAL HIGH (ref 65–99)
POTASSIUM: 3.7 mmol/L (ref 3.5–5.1)
Sodium: 129 mmol/L — ABNORMAL LOW (ref 135–145)
TOTAL PROTEIN: 6.5 g/dL (ref 6.5–8.1)

## 2016-01-21 LAB — CBC WITH DIFFERENTIAL/PLATELET
Basophils Absolute: 0 10*3/uL (ref 0–0.1)
Basophils Relative: 0 %
EOS ABS: 0 10*3/uL (ref 0–0.7)
Eosinophils Relative: 0 %
HEMATOCRIT: 27.5 % — AB (ref 35.0–47.0)
Hemoglobin: 9.4 g/dL — ABNORMAL LOW (ref 12.0–16.0)
Lymphocytes Relative: 4 %
Lymphs Abs: 0.2 10*3/uL — ABNORMAL LOW (ref 1.0–3.6)
MCH: 29.5 pg (ref 26.0–34.0)
MCHC: 34.2 g/dL (ref 32.0–36.0)
MCV: 86.2 fL (ref 80.0–100.0)
MONO ABS: 0.3 10*3/uL (ref 0.2–0.9)
Monocytes Relative: 5 %
NEUTROS ABS: 4.7 10*3/uL (ref 1.4–6.5)
Neutrophils Relative %: 91 %
Platelets: 178 10*3/uL (ref 150–440)
RBC: 3.19 MIL/uL — ABNORMAL LOW (ref 3.80–5.20)
RDW: 14 % (ref 11.5–14.5)
WBC: 5.2 10*3/uL (ref 3.6–11.0)

## 2016-01-21 LAB — MAGNESIUM: MAGNESIUM: 2.1 mg/dL (ref 1.7–2.4)

## 2016-01-21 MED ORDER — HEPARIN SOD (PORK) LOCK FLUSH 100 UNIT/ML IV SOLN
500.0000 [IU] | Freq: Once | INTRAVENOUS | Status: AC | PRN
  Administered 2016-01-21: 500 [IU]
  Filled 2016-01-21: qty 5

## 2016-01-21 MED ORDER — PACLITAXEL PROTEIN-BOUND CHEMO INJECTION 100 MG
50.0000 mg/m2 | Freq: Once | INTRAVENOUS | Status: AC
  Administered 2016-01-21: 75 mg via INTRAVENOUS
  Filled 2016-01-21: qty 15

## 2016-01-21 MED ORDER — SODIUM CHLORIDE 0.9 % IV SOLN
4.0000 mg | Freq: Once | INTRAVENOUS | Status: AC
  Administered 2016-01-21: 4 mg via INTRAVENOUS
  Filled 2016-01-21: qty 0.4

## 2016-01-21 MED ORDER — SODIUM CHLORIDE 0.9 % IV SOLN
Freq: Once | INTRAVENOUS | Status: AC
  Administered 2016-01-21: 13:00:00 via INTRAVENOUS
  Filled 2016-01-21: qty 1000

## 2016-01-21 MED ORDER — SODIUM CHLORIDE 0.9 % IV SOLN
130.0000 mg | Freq: Once | INTRAVENOUS | Status: AC
  Administered 2016-01-21: 130 mg via INTRAVENOUS
  Filled 2016-01-21: qty 13

## 2016-01-21 MED ORDER — SODIUM CHLORIDE 0.9% FLUSH
10.0000 mL | INTRAVENOUS | Status: DC | PRN
  Administered 2016-01-21: 10 mL via INTRAVENOUS
  Filled 2016-01-21: qty 10

## 2016-01-21 MED ORDER — PALONOSETRON HCL INJECTION 0.25 MG/5ML
0.2500 mg | Freq: Once | INTRAVENOUS | Status: AC
  Administered 2016-01-21: 0.25 mg via INTRAVENOUS
  Filled 2016-01-21: qty 5

## 2016-01-21 NOTE — Progress Notes (Signed)
[place] @ [facility] Telephone:(336) [phone]  Fax:(336) [place]: 1934-07-15  MR#: [id number]  LPF#:[id number]  Patient Care Team: Letta Median, MD as PCP - General Clent Jacks, RN as Registered Nurse Hollice Espy, MD as Consulting Physician (Urology)  CHIEF COMPLAINT:  Chief Complaint  Patient presents with  . 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  Diagnosis by cystoscopy in March of 2017 2.  Hydronephrosis was rising serum creatinine status post bilateral percutaneous nephrostomy drainage (March of 2017). 3.Patient is starting radiation therapy carboplatinum and Taxol from January 07, 2016 4.Has been changed to carboplatinum and Abraxane from March 2,2 2017  VISIT DIAGNOSIS:   No diagnosis found.    No history exists.    Oncology Flowsheet 12/10/2015 12/11/2015 01/07/2016 01/14/2016  Day, Cycle - - Day 1, Cycle 1 Day 1, Cycle 2  CARBOplatin (PARAPLATIN) IV - - 130 mg 130 mg  dexamethasone (DECADRON) IV - - 20 mg 4 mg  ondansetron (ZOFRAN) IV - 4 mg - -  PACLitaxel (TAXOL) IV - - 45 mg/m2 -  PACLitaxel-protein bound (ABRAXANE) IV - - - 50 mg/m2  palonosetron (ALOXI) IV - - 0.25 mg 0.25 mg    INTERVAL HISTORY:  82 year old African-American lady very poor historian started having lower abdominal discomfort bleeding from vagina or urine not sure at that point in time further evaluation revealed hydronephrosis cystoscopy revealed a bladder mass which on biopsy.  Revealed invasive carcinoma with squamous differentiation. PATIENT  is here for ongoing evaluation and continuation of treatment.  He tolerated last treatment very well.  After Taxol was changed to Abraxane Appetite continues to be poor.  Has lost weight. No diarrhea.  Patient is accompanied with the family prednisone was started for appetite which is not help patient.  REVIEW OF SYSTEMS:   Gen.  status: Performance status is 2.  Not in any acute distress. She and is noncommunicative answers questions but does not participate in conversation.  Most of the questions were answered by family.  Had a port placement.  Patient has attended chemotherapy class. GI: Appetite is poor.  Lower abdominal discomfort. HEENT: No difficulty swallowing.  No headache.  No dizziness. Lungs: No shortness of breath patient has not smoked in the past. Cardiac: History of hypertension but no history of coronary artery disease GU: Had hematuria.  Hydronephrosis.  Bilateral percutaneous nephrostomy Skin: No rash Neurological system no headache no dizziness Lower extremity no swelling Musculoskeletal system no bony pain  As per HPI. Otherwise, a complete review of systems is negatve.  (All other 12 systems have been reviewed  PAST MEDICAL HISTORY: Past Medical History  Diagnosis Date  . Hypertension   . Shortness of breath dyspnea     doe  . Arthritis   . Complication of anesthesia     tonsillectomy age 37 bad experience with mask and hard to wake up  . Enlarged kidney   . Hernia of abdominal wall 12/04/2015    left side  . Acute kidney failure ([place]) 12/11/2015  . Carcinoma of cervix, stage 4 ([place]) 12/31/2015    PAST SURGICAL HISTORY: Past Surgical History  Procedure Laterality Date  . Tonsillectomy    . Cystoscopy w/ retrogrades Left 12/10/2015    Procedure: CYSTOSCOPY WITH RETROGRADE PYELOGRAM;  Surgeon: Hollice Espy, MD;  Location: [facility];  Service: Urology;  Laterality: Left;  . Transurethral resection of bladder tumor  N/A 12/10/2015    Procedure: TRANSURETHRAL RESECTION OF BLADDER TUMOR (TURBT);  Surgeon: Hollice Espy, MD;  Location: [facility];  Service: Urology;  Laterality: N/A;  . Peripheral vascular catheterization N/A 01/03/2016    Procedure: Porta Cath Insertion;  Surgeon: Algernon Huxley, MD;  Location: [place] [facility];  Service: Cardiovascular;  Laterality: N/A;    FAMILY  HISTORY Family History  Problem Relation Age of Onset  . Cancer Mother   . Prostate cancer Neg Hx   . Bladder Cancer Neg Hx   . Kidney cancer Neg Hx     GYNECOLOGIC HISTORY:  No LMP recorded. Patient is postmenopausal.     ADVANCED DIRECTIVES:  Patient does not have any living will or healthcare power of attorney.  Information was given .  Available resources had been discussed.  We will follow-up on subsequent appointments regarding this issue  HEALTH MAINTENANCE: Social History  Substance Use Topics  . Smoking status: Never Smoker   . Smokeless tobacco: Never Used  . Alcohol Use: No       Allergies  Allergen Reactions  . Hydralazine Palpitations  . Taxol [Paclitaxel] Other (See Comments)    Shortness of breath, facial flushing, dizziness.     Current Outpatient Prescriptions  Medication Sig Dispense Refill  . amLODipine (NORVASC) 10 MG tablet Take 10 mg by mouth daily. After lunch    . aspirin 81 MG tablet Take 81 mg by mouth every morning.     . Cholecalciferol (VITAMIN D3) 50000 units CAPS Take 1 capsule by mouth once a week. Reported on 01/14/2016    . Cholecalciferol 4000 UNITS CAPS Take 4,000 Units by mouth daily. Reported on 01/14/2016    . dexamethasone (DECADRON) 4 MG tablet take 2 tablets by mouth THE NIGHT BEFORE CHEMOTHERAPY ONLY  0  . docusate sodium (COLACE) 100 MG capsule Take 2 capsules (200 mg total) by mouth 2 (two) times daily. 120 capsule 0  . lidocaine-prilocaine (EMLA) cream Apply 1 application topically as needed. 30 g 3  . ondansetron (ZOFRAN) 4 MG tablet take 1 tablet by mouth every 6 hours if needed  0  . predniSONE (DELTASONE) 20 MG tablet Take 1 tablet (20 mg total) by mouth daily with breakfast. 30 tablet 0  . quinapril (ACCUPRIL) 20 MG tablet Take 20 mg by mouth 2 (two) times daily.     No current facility-administered medications for this visit.   Facility-Administered Medications Ordered in Other Visits  Medication Dose Route Frequency  Provider Last Rate Last Dose  . sodium chloride flush (NS) 0.9 % injection 10 mL  10 mL Intravenous PRN Forest Gleason, MD   10 mL at 01/21/16 1043    OBJECTIVE: PHYSICAL EXAM: GENERAL:  Patient is not in any distress.  Has lost significant weight.  Poor historian.  Family with the patient answering most of the questions . MENTAL STATUS:  Alert and oriented to person, place and time.  ENT:  Oropharynx clear without lesion.  Tongue normal. Mucous membranes moist.  RESPIRATORY:  Clear to auscultation without rales, wheezes or rhonchi. CARDIOVASCULAR:  Regular rate and rhythm without murmur, rub or gallop. . ABDOMEN:  Soft, non-tender, with active bowel sounds, and no hepatosplenomegaly.  No masses.  Ventral hernia BACK:  No CVA tenderness.  No tenderness on percussion of the back or rib cage. SKIN:  No rashes, ulcers or lesions. EXTREMITIES: No edema, no skin discoloration or tenderness.  No palpable cords. LYMPH NODES: No palpable cervical, supraclavicular, axillary or inguinal  adenopathy  NEUROLOGICAL: Unremarkable. PSYCH:  Appropriate.  Filed Vitals:   01/21/16 1121  BP: 122/72  Pulse: 59  Temp: 96.7 F (35.9 C)  Resp: 18     Body mass index is 21.99 kg/(m^2).    ECOG FS:2 - Symptomatic, <50% confined to bed  LAB RESULTS:  Infusion on 01/21/2016  Component Date Value Ref Range Status  . WBC 01/21/2016 5.2  3.6 - 11.0 K/uL Final  . RBC 01/21/2016 3.19* 3.80 - 5.20 MIL/uL Final  . Hemoglobin 01/21/2016 9.4* 12.0 - 16.0 g/dL Final  . HCT 01/21/2016 27.5* 35.0 - 47.0 % Final  . MCV 01/21/2016 86.2  80.0 - 100.0 fL Final  . MCH 01/21/2016 29.5  26.0 - 34.0 pg Final  . MCHC 01/21/2016 34.2  32.0 - 36.0 g/dL Final  . RDW 01/21/2016 14.0  11.5 - 14.5 % Final  . Platelets 01/21/2016 178  150 - 440 K/uL Final  . Neutrophils Relative % 01/21/2016 91   Final  . Neutro Abs 01/21/2016 4.7  1.4 - 6.5 K/uL Final  . Lymphocytes Relative 01/21/2016 4   Final  . Lymphs Abs 01/21/2016 0.2*  1.0 - 3.6 K/uL Final  . Monocytes Relative 01/21/2016 5   Final  . Monocytes Absolute 01/21/2016 0.3  0.2 - 0.9 K/uL Final  . Eosinophils Relative 01/21/2016 0   Final  . Eosinophils Absolute 01/21/2016 0.0  0 - 0.7 K/uL Final  . Basophils Relative 01/21/2016 0   Final  . Basophils Absolute 01/21/2016 0.0  0 - 0.1 K/uL Final  . Sodium 01/21/2016 129* 135 - 145 mmol/L Final  . Potassium 01/21/2016 3.7  3.5 - 5.1 mmol/L Final  . Chloride 01/21/2016 95* 101 - 111 mmol/L Final  . CO2 01/21/2016 28  22 - 32 mmol/L Final  . Glucose, Bld 01/21/2016 163* 65 - 99 mg/dL Final  . BUN 01/21/2016 25* 6 - 20 mg/dL Final  . Creatinine, Ser 01/21/2016 0.95  0.44 - 1.00 mg/dL Final  . Calcium 01/21/2016 9.3  8.9 - 10.3 mg/dL Final  . Total Protein 01/21/2016 6.5  6.5 - 8.1 g/dL Final  . Albumin 01/21/2016 3.0* 3.5 - 5.0 g/dL Final  . AST 01/21/2016 37  15 - 41 U/L Final  . ALT 01/21/2016 23  14 - 54 U/L Final  . Alkaline Phosphatase 01/21/2016 121  38 - 126 U/L Final  . Total Bilirubin 01/21/2016 0.3  0.3 - 1.2 mg/dL Final  . GFR calc non Af Amer 01/21/2016 55* >60 mL/min Final  . GFR calc Af Amer 01/21/2016 >60  >60 mL/min Final   Comment: (NOTE) The eGFR has been calculated using the CKD EPI equation. This calculation has not been validated in all clinical situations. eGFR's persistently <60 mL/min signify possible Chronic Kidney Disease.   . Anion gap 01/21/2016 6  5 - 15 Final  . Magnesium 01/21/2016 2.1  1.7 - 2.4 mg/dL Final     STUDIES: Nm Pet Image Initial (pi) Skull Base To Thigh  12/26/2015  CLINICAL DATA:  Subsequent treatment strategy for T4 cervical cancer. EXAM: NUCLEAR MEDICINE PET SKULL BASE TO THIGH TECHNIQUE: 10.9 mCi F-18 FDG was injected intravenously. Full-ring PET imaging was performed from the skull base to thigh after the radiotracer. CT data was obtained and used for attenuation correction and anatomic localization. FASTING BLOOD GLUCOSE:  Value: 82 mg/dl COMPARISON:   10/24/2015 abdominal pelvic CT. FINDINGS: NECK No areas of abnormal hypermetabolism. CHEST No areas of abnormal hypermetabolism. ABDOMEN/PELVIS Development of hepatic  metastasis since 10/24/2015. Index left hepatic lobe lesion is relatively ill-defined on CT, but a S.U.V. max of 7.4 on image 128/series 3. This central right hepatic lobe lesion measures a S.U.V. max of 6.4 on image 126/series 3. Other multiple hypo attenuating lesions, including in the right liver lobe on image 145/series 3, new. Small retroperitoneal node demonstrates low-level hypermetabolism. this measures 7 mm and a S.U.V. max of 2.8 on image 155/series 3. 5 mm on the prior diagnostic study. Locally advanced cervical carcinoma, as evidenced by amorphous hypermetabolic soft tissue throughout the pelvis. This is increased. For example, this measures 8.1 x 8.0 cm and a S.U.V. max of 18.8 on image 207/series 3. Soft tissue density at the same level on the prior measures on the order of 5.8 x 6.0 cm (when remeasured). Left obturator node measures 1.5 cm and a S.U.V. max of 6.0 versus 8 mm on the prior diagnostic CT. SKELETON No abnormal marrow activity. CT IMAGES PERFORMED FOR ATTENUATION CORRECTION No cervical adenopathy. Mild cardiomegaly. Small right pleural effusion is new. Centrilobular emphysema. Scattered areas of atelectasis at the lung bases, worse on the right. Nonspecific mild is pulmonary nodularity, including a subpleural posterior left upper lobe 2 mm nodule image 96/series 3. Interval placement of bilateral nephrostomy tubes without residual hydronephrosis. Colonic stool burden suggests constipation. Ventral abdominal wall laxity containing nonobstructive transverse colon. Uterine fibroids. Right hip osteoarthritis. IMPRESSION: 1. Since 10/24/2015 diagnostic CT, disease progression. 2. New hepatic metastasis. 3. Progression of locally advanced cervical carcinoma, as evidenced by enlargement of an amorphous hypermetabolic pelvic soft  tissue mass. 4. New or progressive left obturator nodal metastasis. Small abdominal retroperitoneal node is mildly hypermetabolic and indeterminate. 5. Placement of bilateral nephrostomy catheters, without hydronephrosis. 6. New small right pleural effusion. Electronically Signed   By: Abigail Miyamoto M.D.   On: 12/26/2015 16:18   Ir Nephrostomy Exchange Left  01/04/2016  INDICATION: History of cervical cancer with ureteral obstruction and need for bilateral nephrostomy catheter exchange. EXAM: IR EXCHANGE NEPHROSTOMY RIGHT; IR EXCHANGE NEPHROSTOMY LEFT COMPARISON:  None. MEDICATIONS: Ancef 1 gm IV; The antibiotic was administered in an appropriate time frame prior to skin puncture. ANESTHESIA/SEDATION: None CONTRAST:  48m OMNIPAQUE IOHEXOL 300 MG/ML SOLN - administered into the collecting system(s) FLUOROSCOPY TIME:  Fluoroscopy Time: 1 minutes 48 seconds (182.5mGy COMPLICATIONS: None immediate. PROCEDURE: Informed written consent was obtained from the patient and her family after a thorough discussion of the procedural risks, benefits and alternatives. All questions were addressed. Maximal Sterile Barrier Technique was utilized including caps, mask, sterile gowns, sterile gloves, sterile drape, hand hygiene and skin antiseptic. A timeout was performed prior to the initiation of the procedure. Utilizing 1% xylocaine as local anesthetic and fluoroscopic guidance a guidewire was advanced into the nephrostomy catheter on the left and the catheter was removed. A new 10 French nephrostomy catheter was placed over the wiring coiled within the renal collecting system without difficulty. Contrast was injected to confirm catheter position. The catheter was then sutured into place utilizing a Hopkins buttress and placed to an external drainage bag. Attention was then turned to the right in utilizing a similar technique to the right nephrostomy catheter was exchanged. Catheter position was confirmed utilizing a contrast  injection. The patient tolerated the procedure well and was discharged home after short observation period. IMPRESSION: Bilateral exchange of nephrostomy catheters as described. Electronically Signed   By: MInez CatalinaM.D.   On: 01/04/2016 14:32   Ir Nephrostomy Exchange Right  01/04/2016  INDICATION: History of cervical cancer with ureteral obstruction and need for bilateral nephrostomy catheter exchange. EXAM: IR EXCHANGE NEPHROSTOMY RIGHT; IR EXCHANGE NEPHROSTOMY LEFT COMPARISON:  None. MEDICATIONS: Ancef 1 gm IV; The antibiotic was administered in an appropriate time frame prior to skin puncture. ANESTHESIA/SEDATION: None CONTRAST:  60m OMNIPAQUE IOHEXOL 300 MG/ML SOLN - administered into the collecting system(s) FLUOROSCOPY TIME:  Fluoroscopy Time: 1 minutes 48 seconds (197.4mGy COMPLICATIONS: None immediate. PROCEDURE: Informed written consent was obtained from the patient and her family after a thorough discussion of the procedural risks, benefits and alternatives. All questions were addressed. Maximal Sterile Barrier Technique was utilized including caps, mask, sterile gowns, sterile gloves, sterile drape, hand hygiene and skin antiseptic. A timeout was performed prior to the initiation of the procedure. Utilizing 1% xylocaine as local anesthetic and fluoroscopic guidance a guidewire was advanced into the nephrostomy catheter on the left and the catheter was removed. A new 10 French nephrostomy catheter was placed over the wiring coiled within the renal collecting system without difficulty. Contrast was injected to confirm catheter position. The catheter was then sutured into place utilizing a Hopkins buttress and placed to an external drainage bag. Attention was then turned to the right in utilizing a similar technique to the right nephrostomy catheter was exchanged. Catheter position was confirmed utilizing a contrast injection. The patient tolerated the procedure well and was discharged home after  short observation period. IMPRESSION: Bilateral exchange of nephrostomy catheters as described. Electronically Signed   By: MInez CatalinaM.D.   On: 01/04/2016 14:32    ASSESSMENT: Carcinoma of cervix.  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 2.  Patient is tolerating carboplatinum and Abraxane and radiation therapy Has lost significant weight and will be monitored with consult dietitian to see if any supplements would be helpful.  In my absence patient will be evaluated by Dr. B I had prolonged discussion today with patient and family about overall prognosis that we are dealing with stage IV disease and cold O for this treatment is palliative in nature this has been discussed again as family thought this is going to get an immediate number of treatment and after that no further treatment would be required.  Total duration of visit was33mutes.  50% or more time was spent in counseling patient and family regarding prognosis and options of treatment and available resources  Significant amount of time was spent at patient's bedside managing infusion reaction Patient expressed understanding and was in agreement with this plan. She also understands that She can call clinic at any time with any questions, concerns, or complaints.    Carcinoma of cervix, stage 4 ([facility]  Staging form: Cervix Uteri, AJCC 7th Edition     Clinical: FIGO Stage IVB (T4, N1, M1) - Signed by JaForest GleasonMD on 12/31/2015   JaForest GleasonMD   01/21/2016 11:56 AM

## 2016-01-21 NOTE — Progress Notes (Signed)
Patient was prescribed prednisone last week to help with appetite.  As of today there has been no improvement.

## 2016-01-22 ENCOUNTER — Ambulatory Visit
Admission: RE | Admit: 2016-01-22 | Discharge: 2016-01-22 | Disposition: A | Discharge status: Home / Self Care | Payer: Medicare Other | Source: Ambulatory Visit | Attending: Radiation Oncology | Admitting: Radiation Oncology

## 2016-01-22 DIAGNOSIS — Z510 Encounter for antineoplastic radiation therapy: Secondary | ICD-10-CM | POA: Diagnosis not present

## 2016-01-23 ENCOUNTER — Ambulatory Visit
Admission: RE | Admit: 2016-01-23 | Discharge: 2016-01-23 | Disposition: A | Discharge status: Home / Self Care | Payer: Medicare Other | Source: Ambulatory Visit | Attending: Radiation Oncology | Admitting: Radiation Oncology

## 2016-01-23 DIAGNOSIS — Z510 Encounter for antineoplastic radiation therapy: Secondary | ICD-10-CM | POA: Diagnosis not present

## 2016-01-24 ENCOUNTER — Ambulatory Visit
Admission: RE | Admit: 2016-01-24 | Discharge: 2016-01-24 | Disposition: A | Discharge status: Home / Self Care | Payer: Medicare Other | Source: Ambulatory Visit | Attending: Radiation Oncology | Admitting: Radiation Oncology

## 2016-01-24 DIAGNOSIS — Z510 Encounter for antineoplastic radiation therapy: Secondary | ICD-10-CM | POA: Diagnosis not present

## 2016-01-25 ENCOUNTER — Ambulatory Visit
Admission: RE | Admit: 2016-01-25 | Discharge: 2016-01-25 | Disposition: A | Discharge status: Home / Self Care | Payer: Medicare Other | Source: Ambulatory Visit | Attending: Radiation Oncology | Admitting: Radiation Oncology

## 2016-01-25 DIAGNOSIS — Z510 Encounter for antineoplastic radiation therapy: Secondary | ICD-10-CM | POA: Diagnosis not present

## 2016-01-28 ENCOUNTER — Ambulatory Visit
Admission: RE | Admit: 2016-01-28 | Discharge: 2016-01-28 | Disposition: A | Discharge status: Home / Self Care | Payer: Medicare Other | Source: Ambulatory Visit | Attending: Radiation Oncology | Admitting: Radiation Oncology

## 2016-01-28 ENCOUNTER — Inpatient Hospital Stay: Payer: Medicare Other | Attending: Internal Medicine

## 2016-01-28 ENCOUNTER — Inpatient Hospital Stay (HOSPITAL_BASED_OUTPATIENT_CLINIC_OR_DEPARTMENT_OTHER): Payer: Medicare Other | Admitting: Internal Medicine

## 2016-01-28 ENCOUNTER — Inpatient Hospital Stay: Payer: Medicare Other

## 2016-01-28 VITALS — BP 94/52 | HR 65 | Temp 98.0°F | Resp 18 | Wt 124.6 lb

## 2016-01-28 VITALS — BP 98/62 | HR 66

## 2016-01-28 DIAGNOSIS — C799 Secondary malignant neoplasm of unspecified site: Secondary | ICD-10-CM

## 2016-01-28 DIAGNOSIS — N179 Acute kidney failure, unspecified: Secondary | ICD-10-CM | POA: Diagnosis not present

## 2016-01-28 DIAGNOSIS — R0602 Shortness of breath: Secondary | ICD-10-CM | POA: Diagnosis not present

## 2016-01-28 DIAGNOSIS — I10 Essential (primary) hypertension: Secondary | ICD-10-CM

## 2016-01-28 DIAGNOSIS — Z7952 Long term (current) use of systemic steroids: Secondary | ICD-10-CM | POA: Insufficient documentation

## 2016-01-28 DIAGNOSIS — R634 Abnormal weight loss: Secondary | ICD-10-CM | POA: Diagnosis not present

## 2016-01-28 DIAGNOSIS — Z510 Encounter for antineoplastic radiation therapy: Secondary | ICD-10-CM | POA: Diagnosis not present

## 2016-01-28 DIAGNOSIS — C539 Malignant neoplasm of cervix uteri, unspecified: Secondary | ICD-10-CM | POA: Insufficient documentation

## 2016-01-28 DIAGNOSIS — M129 Arthropathy, unspecified: Secondary | ICD-10-CM | POA: Insufficient documentation

## 2016-01-28 DIAGNOSIS — Z5111 Encounter for antineoplastic chemotherapy: Secondary | ICD-10-CM | POA: Diagnosis present

## 2016-01-28 DIAGNOSIS — N2881 Hypertrophy of kidney: Secondary | ICD-10-CM | POA: Diagnosis not present

## 2016-01-28 DIAGNOSIS — R630 Anorexia: Secondary | ICD-10-CM | POA: Diagnosis not present

## 2016-01-28 DIAGNOSIS — Z809 Family history of malignant neoplasm, unspecified: Secondary | ICD-10-CM

## 2016-01-28 DIAGNOSIS — K439 Ventral hernia without obstruction or gangrene: Secondary | ICD-10-CM | POA: Diagnosis not present

## 2016-01-28 DIAGNOSIS — C787 Secondary malignant neoplasm of liver and intrahepatic bile duct: Secondary | ICD-10-CM | POA: Insufficient documentation

## 2016-01-28 DIAGNOSIS — R5383 Other fatigue: Secondary | ICD-10-CM | POA: Insufficient documentation

## 2016-01-28 DIAGNOSIS — Z79899 Other long term (current) drug therapy: Secondary | ICD-10-CM | POA: Diagnosis not present

## 2016-01-28 DIAGNOSIS — Z7982 Long term (current) use of aspirin: Secondary | ICD-10-CM | POA: Insufficient documentation

## 2016-01-28 DIAGNOSIS — R531 Weakness: Secondary | ICD-10-CM | POA: Insufficient documentation

## 2016-01-28 DIAGNOSIS — Z923 Personal history of irradiation: Secondary | ICD-10-CM | POA: Diagnosis not present

## 2016-01-28 LAB — COMPREHENSIVE METABOLIC PANEL
ALT: 22 U/L (ref 14–54)
AST: 35 U/L (ref 15–41)
Albumin: 2.9 g/dL — ABNORMAL LOW (ref 3.5–5.0)
Alkaline Phosphatase: 95 U/L (ref 38–126)
Anion gap: 9 (ref 5–15)
BUN: 36 mg/dL — AB (ref 6–20)
CHLORIDE: 95 mmol/L — AB (ref 101–111)
CO2: 25 mmol/L (ref 22–32)
CREATININE: 1.88 mg/dL — AB (ref 0.44–1.00)
Calcium: 9.2 mg/dL (ref 8.9–10.3)
GFR calc Af Amer: 28 mL/min — ABNORMAL LOW (ref >60)
GFR calc non Af Amer: 24 mL/min — ABNORMAL LOW (ref >60)
Glucose, Bld: 188 mg/dL — ABNORMAL HIGH (ref 65–99)
POTASSIUM: 3.7 mmol/L (ref 3.5–5.1)
SODIUM: 129 mmol/L — AB (ref 135–145)
Total Bilirubin: 0.4 mg/dL (ref 0.3–1.2)
Total Protein: 6.8 g/dL (ref 6.5–8.1)

## 2016-01-28 LAB — CBC WITH DIFFERENTIAL/PLATELET
BASOS ABS: 0 10*3/uL (ref 0–0.1)
BASOS PCT: 0 %
EOS ABS: 0 10*3/uL (ref 0–0.7)
EOS PCT: 0 %
HCT: 27.8 % — ABNORMAL LOW (ref 35.0–47.0)
Hemoglobin: 9.5 g/dL — ABNORMAL LOW (ref 12.0–16.0)
LYMPHS PCT: 2 %
Lymphs Abs: 0.1 10*3/uL — ABNORMAL LOW (ref 1.0–3.6)
MCH: 29.6 pg (ref 26.0–34.0)
MCHC: 34.1 g/dL (ref 32.0–36.0)
MCV: 86.7 fL (ref 80.0–100.0)
Monocytes Absolute: 0.2 10*3/uL (ref 0.2–0.9)
Monocytes Relative: 2 %
Neutro Abs: 8.5 10*3/uL — ABNORMAL HIGH (ref 1.4–6.5)
Neutrophils Relative %: 96 %
PLATELETS: 123 10*3/uL — AB (ref 150–440)
RBC: 3.21 MIL/uL — AB (ref 3.80–5.20)
RDW: 14.5 % (ref 11.5–14.5)
WBC: 8.9 10*3/uL (ref 3.6–11.0)

## 2016-01-28 LAB — MAGNESIUM: Magnesium: 2.1 mg/dL (ref 1.7–2.4)

## 2016-01-28 MED ORDER — MEGESTROL ACETATE 625 MG/5ML PO SUSP: 625.0000 mg | Freq: Every day | ORAL | Status: DC

## 2016-01-28 MED ORDER — SODIUM CHLORIDE 0.9 % IV SOLN: Freq: Once | INTRAVENOUS | Status: DC

## 2016-01-28 MED ORDER — PALONOSETRON HCL INJECTION 0.25 MG/5ML
0.2500 mg | Freq: Once | INTRAVENOUS | Status: AC
  Administered 2016-01-28: 0.25 mg via INTRAVENOUS
  Filled 2016-01-28: qty 5

## 2016-01-28 MED ORDER — SODIUM CHLORIDE 0.9% FLUSH
10.0000 mL | INTRAVENOUS | Status: DC | PRN
  Administered 2016-01-28: 10 mL
  Filled 2016-01-28: qty 10

## 2016-01-28 MED ORDER — SODIUM CHLORIDE 0.9 % IV SOLN
4.0000 mg | Freq: Once | INTRAVENOUS | Status: DC
  Filled 2016-01-28: qty 0.4

## 2016-01-28 MED ORDER — SODIUM CHLORIDE 0.9 % IV SOLN
Freq: Once | INTRAVENOUS | Status: AC
  Administered 2016-01-28: 12:00:00 via INTRAVENOUS
  Filled 2016-01-28: qty 4

## 2016-01-28 MED ORDER — HEPARIN SOD (PORK) LOCK FLUSH 100 UNIT/ML IV SOLN
500.0000 [IU] | Freq: Once | INTRAVENOUS | Status: AC | PRN
  Administered 2016-01-28: 500 [IU]
  Filled 2016-01-28: qty 5

## 2016-01-28 MED ORDER — PROMETHAZINE HCL 25 MG/ML IJ SOLN: 25.0000 mg | Freq: Once | INTRAMUSCULAR | Status: DC

## 2016-01-28 MED ORDER — SODIUM CHLORIDE 0.9 % IV SOLN
Freq: Once | INTRAVENOUS | Status: AC
  Administered 2016-01-28: 12:00:00 via INTRAVENOUS
  Filled 2016-01-28: qty 1000

## 2016-01-28 MED ORDER — PACLITAXEL PROTEIN-BOUND CHEMO INJECTION 100 MG
50.0000 mg/m2 | Freq: Once | INTRAVENOUS | Status: AC
  Administered 2016-01-28: 75 mg via INTRAVENOUS
  Filled 2016-01-28: qty 15

## 2016-01-28 NOTE — Progress Notes (Signed)
[place] OFFICE PROGRESS NOTE  Patient Care Team: Abby Cindy Hazy, MD as PCP - General Clent Jacks, RN as Registered Nurse Hollice Espy, MD as Consulting Physician (Urology)   SUMMARY OF ONCOLOGIC HISTORY:  # FEB 2017- metastatic cervical cancer- concurrent chemoradiation/local control  INTERVAL HISTORY:  82 year old female patient with above history of metastatic cervical cancer status post bilateral nephrostomy tubes; currently on chemotherapy with carboplatin and Abraxane along with radiation for local control. Patient is currently status post 3 weekly treatments of carbo Abraxane.  Patient complains of significant fatigue. Poor appetite. On prednisone 20 mg a day. Otherwise no nausea no vomiting.  Patient has lost about 6 pounds in the last 2 weeks.. Denies any vaginal discharge or bleeding.    Patient however continues to feel weak.  She denies any pain. Denies any diarrhea or any tingling and numbness.  REVIEW OF SYSTEMS:  A complete 10 point review of system is done which is negative except mentioned above/history of present illness.   PAST MEDICAL HISTORY :  Past Medical History  Diagnosis Date  . Hypertension   . Shortness of breath dyspnea     doe  . Arthritis   . Complication of anesthesia     tonsillectomy age 67 bad experience with mask and hard to wake up  . Enlarged kidney   . Hernia of abdominal wall 12/04/2015    left side  . Acute kidney failure ([place]) 12/11/2015  . Carcinoma of cervix, stage 4 ([place]) 12/31/2015    PAST SURGICAL HISTORY :   Past Surgical History  Procedure Laterality Date  . Tonsillectomy    . Cystoscopy w/ retrogrades Left 12/10/2015    Procedure: CYSTOSCOPY WITH RETROGRADE PYELOGRAM;  Surgeon: Hollice Espy, MD;  Location: [facility];  Service: Urology;  Laterality: Left;  . Transurethral resection of bladder tumor N/A 12/10/2015    Procedure: TRANSURETHRAL RESECTION OF BLADDER TUMOR (TURBT);  Surgeon: Hollice Espy, MD;  Location: [facility];  Service: Urology;  Laterality: N/A;  . Peripheral vascular catheterization N/A 01/03/2016    Procedure: Porta Cath Insertion;  Surgeon: Algernon Huxley, MD;  Location: [facility];  Service: Cardiovascular;  Laterality: N/A;    FAMILY HISTORY :   Family History  Problem Relation Age of Onset  . Cancer Mother   . Prostate cancer Neg Hx   . Bladder Cancer Neg Hx   . Kidney cancer Neg Hx     SOCIAL HISTORY:   Social History  Substance Use Topics  . Smoking status: Never Smoker   . Smokeless tobacco: Never Used  . Alcohol Use: No    ALLERGIES:  is allergic to hydralazine and taxol.  MEDICATIONS:  Current Outpatient Prescriptions  Medication Sig Dispense Refill  . amLODipine (NORVASC) 10 MG tablet Take 10 mg by mouth daily. After lunch    . aspirin 81 MG tablet Take 81 mg by mouth every morning.     . Cholecalciferol (VITAMIN D3) 50000 units CAPS Take 1 capsule by mouth once a week. Reported on 01/14/2016    . Cholecalciferol 4000 UNITS CAPS Take 4,000 Units by mouth daily. Reported on 01/14/2016    . dexamethasone (DECADRON) 4 MG tablet take 2 tablets by mouth THE NIGHT BEFORE CHEMOTHERAPY ONLY  0  . docusate sodium (COLACE) 100 MG capsule Take 2 capsules (200 mg total) by mouth 2 (two) times daily. 120 capsule 0  . lidocaine-prilocaine (EMLA) cream Apply 1 application topically as needed. 30 g 3  .  ondansetron (ZOFRAN) 4 MG tablet take 1 tablet by mouth every 6 hours if needed  0  . predniSONE (DELTASONE) 20 MG tablet Take 1 tablet (20 mg total) by mouth daily with breakfast. 30 tablet 0  . quinapril (ACCUPRIL) 20 MG tablet Take 20 mg by mouth 2 (two) times daily.     No current facility-administered medications for this visit.    PHYSICAL EXAMINATION:   BP 94/52 mmHg  Pulse 65  Temp(Src) 98 F (36.7 C) (Tympanic)  Wt 124 lb 9 oz (56.5 kg)  Filed Weights   01/28/16 1102  Weight: 124 lb 9 oz (56.5 kg)    GENERAL: Well-nourished  well-developed; Alert, no distress and comfortable.  Patient appears tired. She is in walking with a rolling walker. Accompanied by daughter.  EYES: no pallor or icterus OROPHARYNX: no thrush or ulceration; poor dentition  NECK: supple, no masses felt LYMPH:  no palpable lymphadenopathy in the cervical, axillary or inguinal regions LUNGS: clear to auscultation and  No wheeze or crackles HEART/CVS: regular rate & rhythm and no murmurs; No lower extremity edema ABDOMEN:abdomen soft, non-tender and normal bowel sounds Musculoskeletal:no cyanosis of digits and no clubbing  PSYCH: alert & oriented x 3 with fluent speech NEURO: no focal motor/sensory deficits SKIN:  no rashes or significant lesions; bil nephrostomy tubes.   LABORATORY DATA:  I have reviewed the data as listed    Component Value Date/Time   NA 129* 01/28/2016 1041   NA 138 12/20/2015 0933   K 3.7 01/28/2016 1041   CL 95* 01/28/2016 1041   CO2 25 01/28/2016 1041   GLUCOSE 188* 01/28/2016 1041   GLUCOSE 101* 12/20/2015 0933   BUN 36* 01/28/2016 1041   BUN 9 12/20/2015 0933   CREATININE 1.88* 01/28/2016 1041   CALCIUM 9.2 01/28/2016 1041   PROT 6.8 01/28/2016 1041   ALBUMIN 2.9* 01/28/2016 1041   AST 35 01/28/2016 1041   ALT 22 01/28/2016 1041   ALKPHOS 95 01/28/2016 1041   BILITOT 0.4 01/28/2016 1041   GFRNONAA 24* 01/28/2016 1041   GFRAA 28* 01/28/2016 1041    No results found for: SPEP, UPEP  Lab Results  Component Value Date   WBC 8.9 01/28/2016   NEUTROABS 8.5* 01/28/2016   HGB 9.5* 01/28/2016   HCT 27.8* 01/28/2016   MCV 86.7 01/28/2016   PLT 123* 01/28/2016      Chemistry      Component Value Date/Time   NA 129* 01/28/2016 1041   NA 138 12/20/2015 0933   K 3.7 01/28/2016 1041   CL 95* 01/28/2016 1041   CO2 25 01/28/2016 1041   BUN 36* 01/28/2016 1041   BUN 9 12/20/2015 0933   CREATININE 1.88* 01/28/2016 1041      Component Value Date/Time   CALCIUM 9.2 01/28/2016 1041   ALKPHOS 95  01/28/2016 1041   AST 35 01/28/2016 1041   ALT 22 01/28/2016 1041   BILITOT 0.4 01/28/2016 1041        ASSESSMENT & PLAN:   # Metastatic cervical cancer- currently on palliative chemoradiation/ local control. Patient is currently status post  3 cycle. She tolerated chemotherapy well- however worsening renal function [C discussion below]  # Proceed with cycle #4- of Abraxane today/but hold carboplatin because of renal dysfunction [especially as the treatments are palliative.]. CBC- unremarkable except for hemoglobin 9.9/stable; however creatinine is up to 1.88.  #  Acute renal failure creatinine up to 1.88- likely prerenal/ blood pressure is systolic~ 0000000; patient on ACE  inhibitor's. Recommend IV fluids today/ and also recommend stopping ACE inhibitor.  # Poor appetite- no significant weight loss in the last 2 weeks. Patient is on prednisone. Recommend Megace.  # Urinary obstruction status post percutaneous nephrostomy tubes. Clinically, I do not think her nephrostomy tubes are obstructed this time.   # Follow-up with  me in 1 week/CBC CMP magnesium/chemotherapy. Discussed with Dr. Donella Stade. He agrees.  # Above plan was discussed with the patient and family in detail.      Cammie Sickle, MD 01/28/2016 11:19 AM

## 2016-01-28 NOTE — Patient Instructions (Signed)
Stop taking your Accupril today due to hypotension (low pressure) and abnormal kidney function today.

## 2016-01-28 NOTE — Progress Notes (Signed)
SCr increased, therefore carboplatin on hold. Abraxane only today.

## 2016-01-29 ENCOUNTER — Ambulatory Visit
Admission: RE | Admit: 2016-01-29 | Discharge: 2016-01-29 | Disposition: A | Discharge status: Home / Self Care | Payer: Medicare Other | Source: Ambulatory Visit | Attending: Radiation Oncology | Admitting: Radiation Oncology

## 2016-01-29 DIAGNOSIS — Z510 Encounter for antineoplastic radiation therapy: Secondary | ICD-10-CM | POA: Diagnosis not present

## 2016-01-30 ENCOUNTER — Ambulatory Visit
Admission: RE | Admit: 2016-01-30 | Discharge: 2016-01-30 | Disposition: A | Discharge status: Home / Self Care | Payer: Medicare Other | Source: Ambulatory Visit | Attending: Radiation Oncology | Admitting: Radiation Oncology

## 2016-01-30 DIAGNOSIS — Z510 Encounter for antineoplastic radiation therapy: Secondary | ICD-10-CM | POA: Diagnosis not present

## 2016-01-31 ENCOUNTER — Ambulatory Visit
Admission: RE | Admit: 2016-01-31 | Discharge: 2016-01-31 | Disposition: A | Discharge status: Home / Self Care | Payer: Medicare Other | Source: Ambulatory Visit | Attending: Radiation Oncology | Admitting: Radiation Oncology

## 2016-01-31 DIAGNOSIS — Z510 Encounter for antineoplastic radiation therapy: Secondary | ICD-10-CM | POA: Diagnosis not present

## 2016-02-01 ENCOUNTER — Ambulatory Visit
Admission: RE | Admit: 2016-02-01 | Discharge: 2016-02-01 | Disposition: A | Discharge status: Home / Self Care | Payer: Medicare Other | Source: Ambulatory Visit | Attending: Radiation Oncology | Admitting: Radiation Oncology

## 2016-02-01 DIAGNOSIS — Z510 Encounter for antineoplastic radiation therapy: Secondary | ICD-10-CM | POA: Diagnosis not present

## 2016-02-04 ENCOUNTER — Inpatient Hospital Stay: Payer: Medicare Other

## 2016-02-04 ENCOUNTER — Encounter: Payer: Self-pay | Admitting: *Deleted

## 2016-02-04 ENCOUNTER — Inpatient Hospital Stay (HOSPITAL_BASED_OUTPATIENT_CLINIC_OR_DEPARTMENT_OTHER): Payer: Medicare Other | Admitting: Internal Medicine

## 2016-02-04 ENCOUNTER — Ambulatory Visit
Admission: RE | Admit: 2016-02-04 | Discharge: 2016-02-04 | Disposition: A | Discharge status: Home / Self Care | Payer: Medicare Other | Source: Ambulatory Visit | Attending: Radiation Oncology | Admitting: Radiation Oncology

## 2016-02-04 VITALS — BP 124/77 | HR 71 | Temp 97.2°F | Resp 18 | Wt 120.4 lb

## 2016-02-04 DIAGNOSIS — Z5111 Encounter for antineoplastic chemotherapy: Secondary | ICD-10-CM | POA: Diagnosis not present

## 2016-02-04 DIAGNOSIS — C539 Malignant neoplasm of cervix uteri, unspecified: Secondary | ICD-10-CM

## 2016-02-04 DIAGNOSIS — C799 Secondary malignant neoplasm of unspecified site: Secondary | ICD-10-CM | POA: Diagnosis not present

## 2016-02-04 DIAGNOSIS — Z7952 Long term (current) use of systemic steroids: Secondary | ICD-10-CM

## 2016-02-04 DIAGNOSIS — R0602 Shortness of breath: Secondary | ICD-10-CM

## 2016-02-04 DIAGNOSIS — R630 Anorexia: Secondary | ICD-10-CM | POA: Diagnosis not present

## 2016-02-04 DIAGNOSIS — C538 Malignant neoplasm of overlapping sites of cervix uteri: Secondary | ICD-10-CM

## 2016-02-04 DIAGNOSIS — M129 Arthropathy, unspecified: Secondary | ICD-10-CM

## 2016-02-04 DIAGNOSIS — Z7982 Long term (current) use of aspirin: Secondary | ICD-10-CM

## 2016-02-04 DIAGNOSIS — Z79899 Other long term (current) drug therapy: Secondary | ICD-10-CM

## 2016-02-04 DIAGNOSIS — I10 Essential (primary) hypertension: Secondary | ICD-10-CM

## 2016-02-04 DIAGNOSIS — N2881 Hypertrophy of kidney: Secondary | ICD-10-CM

## 2016-02-04 DIAGNOSIS — R5383 Other fatigue: Secondary | ICD-10-CM | POA: Diagnosis not present

## 2016-02-04 DIAGNOSIS — R634 Abnormal weight loss: Secondary | ICD-10-CM

## 2016-02-04 DIAGNOSIS — K439 Ventral hernia without obstruction or gangrene: Secondary | ICD-10-CM

## 2016-02-04 DIAGNOSIS — N179 Acute kidney failure, unspecified: Secondary | ICD-10-CM

## 2016-02-04 DIAGNOSIS — Z510 Encounter for antineoplastic radiation therapy: Secondary | ICD-10-CM | POA: Diagnosis not present

## 2016-02-04 DIAGNOSIS — R531 Weakness: Secondary | ICD-10-CM

## 2016-02-04 DIAGNOSIS — Z809 Family history of malignant neoplasm, unspecified: Secondary | ICD-10-CM

## 2016-02-04 LAB — CBC WITH DIFFERENTIAL/PLATELET
BASOS PCT: 0 %
Basophils Absolute: 0 10*3/uL (ref 0–0.1)
EOS ABS: 0 10*3/uL (ref 0–0.7)
Eosinophils Relative: 0 %
HEMATOCRIT: 26.7 % — AB (ref 35.0–47.0)
HEMOGLOBIN: 9 g/dL — AB (ref 12.0–16.0)
LYMPHS ABS: 0.2 10*3/uL — AB (ref 1.0–3.6)
Lymphocytes Relative: 11 %
MCH: 29.2 pg (ref 26.0–34.0)
MCHC: 33.9 g/dL (ref 32.0–36.0)
MCV: 86.1 fL (ref 80.0–100.0)
Monocytes Absolute: 0 10*3/uL — ABNORMAL LOW (ref 0.2–0.9)
Monocytes Relative: 3 %
NEUTROS ABS: 1.4 10*3/uL (ref 1.4–6.5)
NEUTROS PCT: 86 %
Platelets: 139 10*3/uL — ABNORMAL LOW (ref 150–440)
RBC: 3.1 MIL/uL — AB (ref 3.80–5.20)
RDW: 14.7 % — ABNORMAL HIGH (ref 11.5–14.5)
WBC: 1.7 10*3/uL — AB (ref 3.6–11.0)

## 2016-02-04 LAB — COMPREHENSIVE METABOLIC PANEL
ALBUMIN: 3 g/dL — AB (ref 3.5–5.0)
ALK PHOS: 94 U/L (ref 38–126)
ALT: 23 U/L (ref 14–54)
AST: 20 U/L (ref 15–41)
Anion gap: 5 (ref 5–15)
BUN: 35 mg/dL — ABNORMAL HIGH (ref 6–20)
CHLORIDE: 98 mmol/L — AB (ref 101–111)
CO2: 28 mmol/L (ref 22–32)
CREATININE: 1.67 mg/dL — AB (ref 0.44–1.00)
Calcium: 9.3 mg/dL (ref 8.9–10.3)
GFR calc non Af Amer: 28 mL/min — ABNORMAL LOW (ref >60)
GFR, EST AFRICAN AMERICAN: 33 mL/min — AB (ref >60)
Glucose, Bld: 197 mg/dL — ABNORMAL HIGH (ref 65–99)
Potassium: 4.2 mmol/L (ref 3.5–5.1)
SODIUM: 131 mmol/L — AB (ref 135–145)
Total Bilirubin: 0.6 mg/dL (ref 0.3–1.2)
Total Protein: 6.7 g/dL (ref 6.5–8.1)

## 2016-02-04 LAB — MAGNESIUM: MAGNESIUM: 2.4 mg/dL (ref 1.7–2.4)

## 2016-02-04 MED ORDER — SODIUM CHLORIDE 0.9 % IV SOLN
Freq: Once | INTRAVENOUS | Status: AC
  Administered 2016-02-04: 12:00:00 via INTRAVENOUS
  Filled 2016-02-04: qty 1000

## 2016-02-04 MED ORDER — HEPARIN SOD (PORK) LOCK FLUSH 100 UNIT/ML IV SOLN
500.0000 [IU] | Freq: Once | INTRAVENOUS | Status: AC
  Administered 2016-02-04: 500 [IU] via INTRAVENOUS
  Filled 2016-02-04: qty 5

## 2016-02-04 MED ORDER — SODIUM CHLORIDE 0.9% FLUSH
10.0000 mL | INTRAVENOUS | Status: DC | PRN
  Administered 2016-02-04: 10 mL via INTRAVENOUS
  Filled 2016-02-04: qty 10

## 2016-02-04 MED ORDER — PACLITAXEL PROTEIN-BOUND CHEMO INJECTION 100 MG
50.0000 mg/m2 | Freq: Once | INTRAVENOUS | Status: AC
  Administered 2016-02-04: 75 mg via INTRAVENOUS
  Filled 2016-02-04: qty 15

## 2016-02-04 MED ORDER — PALONOSETRON HCL INJECTION 0.25 MG/5ML
0.2500 mg | Freq: Once | INTRAVENOUS | Status: AC
  Administered 2016-02-04: 0.25 mg via INTRAVENOUS
  Filled 2016-02-04: qty 5

## 2016-02-04 MED ORDER — SODIUM CHLORIDE 0.9 % IV SOLN
4.0000 mg | Freq: Once | INTRAVENOUS | Status: AC
  Administered 2016-02-04: 4 mg via INTRAVENOUS
  Filled 2016-02-04: qty 0.4

## 2016-02-04 NOTE — Progress Notes (Signed)
Pt is not currently taking megace. The prescription needed a prior auth. I contacted [facility]. Pharmacy states that this msg was faxed on 01/28/16. I explained to pharmacy that this fax was not rcvd by the provider. I asked pharmacy to refax the information so that the prior auth can be initiated.

## 2016-02-04 NOTE — Progress Notes (Signed)
[place] OFFICE PROGRESS NOTE  Patient Care Team: Abby Cindy Hazy, MD as PCP - General Clent Jacks, RN as Registered Nurse Hollice Espy, MD as Consulting Physician (Urology)   SUMMARY OF ONCOLOGIC HISTORY:  # FEB 2017- metastatic cervical cancer- concurrent chemoradiation/local control  INTERVAL HISTORY:  82 year old female patient with above history of metastatic cervical cancer status post bilateral nephrostomy tubes; currently on chemotherapy with carboplatin and Abraxane along with radiation for local control. Patient is currently status post 4 weekly treatments of carbo Abraxane [carbo held with the fourth cycle].  Continues to complain of fatigue and poor appetite. On prednisone 20 mg a day. Lost about 4 pounds since her last visit. Patient has not started Megace as recommended at last visit because of insurance issues.   Otherwise no nausea no vomiting.  Denies any vaginal discharge or bleeding.  She denies any pain. Denies any diarrhea or any tingling and numbness.  REVIEW OF SYSTEMS:  A complete 10 point review of system is done which is negative except mentioned above/history of present illness.   PAST MEDICAL HISTORY :  Past Medical History  Diagnosis Date  . Hypertension   . Shortness of breath dyspnea     doe  . Arthritis   . Complication of anesthesia     tonsillectomy age 78 bad experience with mask and hard to wake up  . Enlarged kidney   . Hernia of abdominal wall 12/04/2015    left side  . Acute kidney failure ([place]) 12/11/2015  . Carcinoma of cervix, stage 4 ([place]) 12/31/2015    PAST SURGICAL HISTORY :   Past Surgical History  Procedure Laterality Date  . Tonsillectomy    . Cystoscopy w/ retrogrades Left 12/10/2015    Procedure: CYSTOSCOPY WITH RETROGRADE PYELOGRAM;  Surgeon: Hollice Espy, MD;  Location: [facility];  Service: Urology;  Laterality: Left;  . Transurethral resection of bladder tumor N/A 12/10/2015    Procedure:  TRANSURETHRAL RESECTION OF BLADDER TUMOR (TURBT);  Surgeon: Hollice Espy, MD;  Location: [facility];  Service: Urology;  Laterality: N/A;  . Peripheral vascular catheterization N/A 01/03/2016    Procedure: Porta Cath Insertion;  Surgeon: Algernon Huxley, MD;  Location: [place] [facility];  Service: Cardiovascular;  Laterality: N/A;    FAMILY HISTORY :   Family History  Problem Relation Age of Onset  . Cancer Mother   . Prostate cancer Neg Hx   . Bladder Cancer Neg Hx   . Kidney cancer Neg Hx     SOCIAL HISTORY:   Social History  Substance Use Topics  . Smoking status: Never Smoker   . Smokeless tobacco: Never Used  . Alcohol Use: No    ALLERGIES:  is allergic to hydralazine and taxol.  MEDICATIONS:  Current Outpatient Prescriptions  Medication Sig Dispense Refill  . amLODipine (NORVASC) 10 MG tablet Take 10 mg by mouth daily. After lunch    . aspirin 81 MG tablet Take 81 mg by mouth every morning.     . Cholecalciferol (VITAMIN D3) 50000 units CAPS Take 1 capsule by mouth once a week. Reported on 01/14/2016    . Cholecalciferol 4000 UNITS CAPS Take 4,000 Units by mouth daily. Reported on 01/14/2016    . dexamethasone (DECADRON) 4 MG tablet take 2 tablets by mouth THE NIGHT BEFORE CHEMOTHERAPY ONLY  0  . docusate sodium (COLACE) 100 MG capsule Take 2 capsules (200 mg total) by mouth 2 (two) times daily. 120 capsule 0  . lidocaine-prilocaine (  EMLA) cream Apply 1 application topically as needed. 30 g 3  . megestrol (MEGACE ES) 625 MG/5ML suspension Take 5 mLs (625 mg total) by mouth daily. 150 mL 0  . ondansetron (ZOFRAN) 4 MG tablet take 1 tablet by mouth every 6 hours if needed  0  . predniSONE (DELTASONE) 20 MG tablet Take 1 tablet (20 mg total) by mouth daily with breakfast. 30 tablet 0  . quinapril (ACCUPRIL) 20 MG tablet Take 20 mg by mouth 2 (two) times daily.     No current facility-administered medications for this visit.   Facility-Administered Medications Ordered in Other  Visits  Medication Dose Route Frequency Provider Last Rate Last Dose  . heparin lock flush 100 unit/mL  500 Units Intravenous Once Cammie Sickle, MD      . sodium chloride flush (NS) 0.9 % injection 10 mL  10 mL Intravenous PRN Cammie Sickle, MD   10 mL at 02/04/16 1058    PHYSICAL EXAMINATION:   BP 124/77 mmHg  Pulse 71  Temp(Src) 97.2 F (36.2 C) (Tympanic)  Resp 18  Wt 120 lb 5.9 oz (54.6 kg)  Filed Weights   02/04/16 1116  Weight: 120 lb 5.9 oz (54.6 kg)    GENERAL: Well-nourished well-developed; Alert, no distress and comfortable.  Patient appears tired. She is in walking with a rolling walker. Accompanied by daughter.  EYES: no pallor or icterus OROPHARYNX: no thrush or ulceration; poor dentition  NECK: supple, no masses felt LYMPH:  no palpable lymphadenopathy in the cervical, axillary or inguinal regions LUNGS: clear to auscultation and  No wheeze or crackles HEART/CVS: regular rate & rhythm and no murmurs; No lower extremity edema ABDOMEN:abdomen soft, non-tender and normal bowel sounds Musculoskeletal:no cyanosis of digits and no clubbing  PSYCH: alert & oriented x 3 with fluent speech NEURO: no focal motor/sensory deficits SKIN:  no rashes or significant lesions; bil nephrostomy tubes.   LABORATORY DATA:  I have reviewed the data as listed    Component Value Date/Time   NA 129* 01/28/2016 1041   NA 138 12/20/2015 0933   K 3.7 01/28/2016 1041   CL 95* 01/28/2016 1041   CO2 25 01/28/2016 1041   GLUCOSE 188* 01/28/2016 1041   GLUCOSE 101* 12/20/2015 0933   BUN 36* 01/28/2016 1041   BUN 9 12/20/2015 0933   CREATININE 1.88* 01/28/2016 1041   CALCIUM 9.2 01/28/2016 1041   PROT 6.8 01/28/2016 1041   ALBUMIN 2.9* 01/28/2016 1041   AST 35 01/28/2016 1041   ALT 22 01/28/2016 1041   ALKPHOS 95 01/28/2016 1041   BILITOT 0.4 01/28/2016 1041   GFRNONAA 24* 01/28/2016 1041   GFRAA 28* 01/28/2016 1041    No results found for: SPEP, UPEP  Lab  Results  Component Value Date   WBC 1.7* 02/04/2016   NEUTROABS 1.4 02/04/2016   HGB 9.0* 02/04/2016   HCT 26.7* 02/04/2016   MCV 86.1 02/04/2016   PLT 139* 02/04/2016      Chemistry      Component Value Date/Time   NA 129* 01/28/2016 1041   NA 138 12/20/2015 0933   K 3.7 01/28/2016 1041   CL 95* 01/28/2016 1041   CO2 25 01/28/2016 1041   BUN 36* 01/28/2016 1041   BUN 9 12/20/2015 0933   CREATININE 1.88* 01/28/2016 1041      Component Value Date/Time   CALCIUM 9.2 01/28/2016 1041   ALKPHOS 95 01/28/2016 1041   AST 35 01/28/2016 1041   ALT 22 01/28/2016  1041   BILITOT 0.4 01/28/2016 1041        ASSESSMENT & PLAN:   # Metastatic cervical cancer- currently on palliative chemoradiation/ local control. Patient is currently status post  4 [#4- carbo-held] cycle. She tolerated chemotherapy well- however worsening renal function [C discussion below].  # Proceed with cycle #4- of Abraxane today/but continue to hold carboplatin because of renal dysfunction [especially as the treatments are palliative.]. CBC-1.7 hemoglobin 9.0 platelets 139. Absolute neutrophil count 1.4.  except for hemoglobin 9.9/stable; however creatinine is up to 1.67/slightly improved.   #  Acute renal failure creatinine up to 1.88- likely prerenal- improved; cont to hold patient on ACE inhibitor's. Continue to hold [place].  # Poor appetite- no significant weight loss in the last 2 weeks. Patient is on prednisone. Megace has been ordered last week; but not started secondary to insurance issues.  # Urinary obstruction status post percutaneous nephrostomy tubes. Clinically, I do not think her nephrostomy tubes are obstructed this time.   # Follow-up with Dr.Choksi on April 18th/Tuesday 1 week/CBC CMP magnesium/chemotherapy.   # Above plan was discussed with the patient and family in detail.      Cammie Sickle, MD 02/04/2016 11:27 AM

## 2016-02-04 NOTE — Progress Notes (Signed)
Prior auth for megace initiated with [facility]. Ref. # [id number]. Pending insurance response.

## 2016-02-04 NOTE — Progress Notes (Signed)
Creatinine: 1.67. Proceed with Abraxane treatment only, hold Carboplatin today per MD, Dr. Rogue Bussing, order.

## 2016-02-05 ENCOUNTER — Ambulatory Visit
Admission: RE | Admit: 2016-02-05 | Discharge: 2016-02-05 | Disposition: A | Discharge status: Home / Self Care | Payer: Medicare Other | Source: Ambulatory Visit | Attending: Radiation Oncology | Admitting: Radiation Oncology

## 2016-02-05 DIAGNOSIS — Z510 Encounter for antineoplastic radiation therapy: Secondary | ICD-10-CM | POA: Diagnosis not present

## 2016-02-06 ENCOUNTER — Telehealth: Payer: Self-pay | Admitting: *Deleted

## 2016-02-06 ENCOUNTER — Ambulatory Visit
Admission: RE | Admit: 2016-02-06 | Discharge: 2016-02-06 | Disposition: A | Discharge status: Home / Self Care | Payer: Medicare Other | Source: Ambulatory Visit | Attending: Radiation Oncology | Admitting: Radiation Oncology

## 2016-02-06 DIAGNOSIS — Z510 Encounter for antineoplastic radiation therapy: Secondary | ICD-10-CM | POA: Diagnosis not present

## 2016-02-06 NOTE — Telephone Encounter (Signed)
Called patient and left message to inform her that insurance would not approve her Megace.  We have tried everything we can to get it approved, however, it has been denied and we do not have a substitute medication at this time.  Patient called back and spoke with Renita Papa, RN regarding the message we left regarding her Megace.  Nira Conn, also spoke with patient's daughter regarding this.  Heather asked if patient would be interested in a dietary referral.  Daughter states she will discuss with patient, and will let us know if they are interested in pursuing referral.

## 2016-02-07 ENCOUNTER — Ambulatory Visit
Admission: RE | Admit: 2016-02-07 | Discharge: 2016-02-07 | Disposition: A | Discharge status: Home / Self Care | Payer: Medicare Other | Source: Ambulatory Visit | Attending: Radiation Oncology | Admitting: Radiation Oncology

## 2016-02-07 DIAGNOSIS — Z510 Encounter for antineoplastic radiation therapy: Secondary | ICD-10-CM | POA: Diagnosis not present

## 2016-02-08 ENCOUNTER — Ambulatory Visit
Admission: RE | Admit: 2016-02-08 | Discharge: 2016-02-08 | Disposition: A | Discharge status: Home / Self Care | Payer: Medicare Other | Source: Ambulatory Visit | Attending: Radiation Oncology | Admitting: Radiation Oncology

## 2016-02-08 DIAGNOSIS — Z510 Encounter for antineoplastic radiation therapy: Secondary | ICD-10-CM | POA: Diagnosis not present

## 2016-02-11 ENCOUNTER — Ambulatory Visit
Admission: RE | Admit: 2016-02-11 | Discharge: 2016-02-11 | Disposition: A | Discharge status: Home / Self Care | Payer: Medicare Other | Source: Ambulatory Visit | Attending: Radiation Oncology | Admitting: Radiation Oncology

## 2016-02-11 DIAGNOSIS — Z510 Encounter for antineoplastic radiation therapy: Secondary | ICD-10-CM | POA: Diagnosis not present

## 2016-02-12 ENCOUNTER — Inpatient Hospital Stay: Payer: Medicare Other

## 2016-02-12 ENCOUNTER — Encounter: Payer: Self-pay | Admitting: Oncology

## 2016-02-12 ENCOUNTER — Inpatient Hospital Stay (HOSPITAL_BASED_OUTPATIENT_CLINIC_OR_DEPARTMENT_OTHER): Payer: Medicare Other | Admitting: Oncology

## 2016-02-12 VITALS — BP 124/83 | HR 59 | Resp 18

## 2016-02-12 VITALS — BP 124/76 | HR 68 | Temp 96.9°F | Resp 18 | Wt 117.5 lb

## 2016-02-12 DIAGNOSIS — Z7952 Long term (current) use of systemic steroids: Secondary | ICD-10-CM

## 2016-02-12 DIAGNOSIS — Z79899 Other long term (current) drug therapy: Secondary | ICD-10-CM

## 2016-02-12 DIAGNOSIS — C787 Secondary malignant neoplasm of liver and intrahepatic bile duct: Secondary | ICD-10-CM

## 2016-02-12 DIAGNOSIS — M129 Arthropathy, unspecified: Secondary | ICD-10-CM

## 2016-02-12 DIAGNOSIS — R531 Weakness: Secondary | ICD-10-CM

## 2016-02-12 DIAGNOSIS — Z923 Personal history of irradiation: Secondary | ICD-10-CM

## 2016-02-12 DIAGNOSIS — Z5111 Encounter for antineoplastic chemotherapy: Secondary | ICD-10-CM | POA: Diagnosis not present

## 2016-02-12 DIAGNOSIS — R5383 Other fatigue: Secondary | ICD-10-CM

## 2016-02-12 DIAGNOSIS — C539 Malignant neoplasm of cervix uteri, unspecified: Secondary | ICD-10-CM

## 2016-02-12 DIAGNOSIS — Z809 Family history of malignant neoplasm, unspecified: Secondary | ICD-10-CM

## 2016-02-12 DIAGNOSIS — N179 Acute kidney failure, unspecified: Secondary | ICD-10-CM

## 2016-02-12 DIAGNOSIS — R630 Anorexia: Secondary | ICD-10-CM

## 2016-02-12 DIAGNOSIS — N2881 Hypertrophy of kidney: Secondary | ICD-10-CM

## 2016-02-12 DIAGNOSIS — R0602 Shortness of breath: Secondary | ICD-10-CM

## 2016-02-12 DIAGNOSIS — C799 Secondary malignant neoplasm of unspecified site: Secondary | ICD-10-CM | POA: Diagnosis not present

## 2016-02-12 DIAGNOSIS — I10 Essential (primary) hypertension: Secondary | ICD-10-CM

## 2016-02-12 DIAGNOSIS — R634 Abnormal weight loss: Secondary | ICD-10-CM

## 2016-02-12 DIAGNOSIS — K439 Ventral hernia without obstruction or gangrene: Secondary | ICD-10-CM

## 2016-02-12 DIAGNOSIS — Z7982 Long term (current) use of aspirin: Secondary | ICD-10-CM

## 2016-02-12 LAB — COMPREHENSIVE METABOLIC PANEL
ALBUMIN: 3.1 g/dL — AB (ref 3.5–5.0)
ALK PHOS: 100 U/L (ref 38–126)
ALT: 11 U/L — AB (ref 14–54)
AST: 18 U/L (ref 15–41)
Anion gap: 6 (ref 5–15)
BILIRUBIN TOTAL: 0.6 mg/dL (ref 0.3–1.2)
BUN: 31 mg/dL — AB (ref 6–20)
CALCIUM: 9.3 mg/dL (ref 8.9–10.3)
CO2: 26 mmol/L (ref 22–32)
CREATININE: 1.75 mg/dL — AB (ref 0.44–1.00)
Chloride: 100 mmol/L — ABNORMAL LOW (ref 101–111)
GFR calc Af Amer: 31 mL/min — ABNORMAL LOW (ref >60)
GFR, EST NON AFRICAN AMERICAN: 27 mL/min — AB (ref >60)
GLUCOSE: 183 mg/dL — AB (ref 65–99)
Potassium: 3.3 mmol/L — ABNORMAL LOW (ref 3.5–5.1)
Sodium: 132 mmol/L — ABNORMAL LOW (ref 135–145)
TOTAL PROTEIN: 6.7 g/dL (ref 6.5–8.1)

## 2016-02-12 LAB — CBC WITH DIFFERENTIAL/PLATELET
Basophils Absolute: 0 10*3/uL (ref 0–0.1)
Basophils Relative: 1 %
EOS PCT: 0 %
Eosinophils Absolute: 0 10*3/uL (ref 0–0.7)
HCT: 27.2 % — ABNORMAL LOW (ref 35.0–47.0)
Hemoglobin: 9.4 g/dL — ABNORMAL LOW (ref 12.0–16.0)
LYMPHS ABS: 0.3 10*3/uL — AB (ref 1.0–3.6)
LYMPHS PCT: 15 %
MCH: 29.5 pg (ref 26.0–34.0)
MCHC: 34.4 g/dL (ref 32.0–36.0)
MCV: 85.8 fL (ref 80.0–100.0)
MONOS PCT: 5 %
Monocytes Absolute: 0.1 10*3/uL — ABNORMAL LOW (ref 0.2–0.9)
Neutro Abs: 1.5 10*3/uL (ref 1.4–6.5)
Neutrophils Relative %: 79 %
PLATELETS: 233 10*3/uL (ref 150–440)
RBC: 3.17 MIL/uL — AB (ref 3.80–5.20)
RDW: 15.7 % — ABNORMAL HIGH (ref 11.5–14.5)
WBC: 1.9 10*3/uL — AB (ref 3.6–11.0)

## 2016-02-12 LAB — MAGNESIUM: MAGNESIUM: 2.3 mg/dL (ref 1.7–2.4)

## 2016-02-12 MED ORDER — HEPARIN SOD (PORK) LOCK FLUSH 100 UNIT/ML IV SOLN
500.0000 [IU] | Freq: Once | INTRAVENOUS | Status: AC
  Administered 2016-02-12: 500 [IU] via INTRAVENOUS
  Filled 2016-02-12: qty 5

## 2016-02-12 MED ORDER — PACLITAXEL PROTEIN-BOUND CHEMO INJECTION 100 MG
50.0000 mg/m2 | Freq: Once | INTRAVENOUS | Status: AC
  Administered 2016-02-12: 75 mg via INTRAVENOUS
  Filled 2016-02-12: qty 15

## 2016-02-12 MED ORDER — PALONOSETRON HCL INJECTION 0.25 MG/5ML
0.2500 mg | Freq: Once | INTRAVENOUS | Status: AC
  Administered 2016-02-12: 0.25 mg via INTRAVENOUS
  Filled 2016-02-12: qty 5

## 2016-02-12 MED ORDER — SODIUM CHLORIDE 0.9 % IV SOLN
4.0000 mg | Freq: Once | INTRAVENOUS | Status: AC
  Administered 2016-02-12: 4 mg via INTRAVENOUS
  Filled 2016-02-12: qty 0.4

## 2016-02-12 MED ORDER — SODIUM CHLORIDE 0.9 % IV SOLN
Freq: Once | INTRAVENOUS | Status: AC
  Administered 2016-02-12: 11:00:00 via INTRAVENOUS
  Filled 2016-02-12: qty 1000

## 2016-02-12 MED ORDER — SODIUM CHLORIDE 0.9% FLUSH
10.0000 mL | INTRAVENOUS | Status: DC | PRN
  Administered 2016-02-12: 10 mL via INTRAVENOUS
  Filled 2016-02-12: qty 10

## 2016-02-12 MED ORDER — SODIUM CHLORIDE 0.9 % IV SOLN
100.0000 mg | Freq: Once | INTRAVENOUS | Status: AC
  Administered 2016-02-12: 100 mg via INTRAVENOUS
  Filled 2016-02-12: qty 10

## 2016-02-12 NOTE — Progress Notes (Signed)
Creatinine: 1.75. MD, Dr. Oliva Bustard, notified via telephone and already aware. Per MD, Dr. Oliva Bustard, order: Proceed with Abraxane and Carboplatin treatment today.

## 2016-02-12 NOTE — Progress Notes (Signed)
Carboplatin dose was decreased per MD judgement to 100mg  due to decreased renal function.

## 2016-02-13 ENCOUNTER — Encounter: Payer: Self-pay | Admitting: Oncology

## 2016-02-13 ENCOUNTER — Encounter: Payer: Medicare Other | Attending: Oncology | Admitting: Dietician

## 2016-02-13 ENCOUNTER — Encounter: Payer: Self-pay | Admitting: Dietician

## 2016-02-13 VITALS — Ht 62.0 in | Wt 116.2 lb

## 2016-02-13 DIAGNOSIS — R636 Underweight: Secondary | ICD-10-CM

## 2016-02-13 NOTE — Progress Notes (Signed)
Medical Nutrition Therapy: Visit start time: 1330  end time: 1400  Assessment:  Diagnosis: Stage IV cervical Cancer, abnormal weight loss Past medical history: HTN Psychosocial issues/ stress concerns: none per patient Preferred learning method:  . Hands-on  Current weight: 116.2lbs  Height: 5'2" Medications, supplements: daughter reports patient is not currently taking any meds.  Progress and evaluation: Significant weight loss in past several months; daughter reports loss of over 50lbs overall.         Patient denies taste changes, nausea; appetite is poor.          Daughter reports leaving foods out for patient which stay untouched. Patient states she has not eating or had anything to drink yet today (at 1:20pm).            Physical activity: none  Dietary Intake:  Usual eating pattern:  no structured eating pattern at this time.    Patient is eating erratically, going for long periods of time without food or drink.  Nutrition Care Education: Topics covered: weight gain Basic nutrition: importance of protein and calorie intake, as well as adequate fluid intake.   Weight gain: strategies and tips for boosting caloric intake; in particular, by eating often enough and choosing calorie-dense foods and drinks.       Discussed strategies for increasing appetite.    Nutritional Diagnosis:  -3.2 Unintentional weight loss As related to cancer, inadequate calorie intake.  As evidenced by patient and daughter's reports.  Intervention: Instruction as noted above.   Patient states she is willing to try suggestions given, although daughter states they have tried some strategies already.    Stressed to patient that adequate fluids and eating small meals/snacks often will help her feel better and possibly increase strength.    Will plan to contact patient by phone in 1 week to check on progress.  Education Materials given:  Marland Kitchen Diet Strategies for Loss of Appetite . Goals/  instructions  Learner/ who was taught:  . Patient  . Family member daughter   Level of understanding: Marland Kitchen Verbalizes/ demonstrates competency  Demonstrated degree of understanding via:   Teach back Learning barriers: . None  Willingness to learn/ readiness for change: . Acceptance, ready for change  Monitoring and Evaluation:  Dietary intake, and body weight      follow up: by phone in 1 week

## 2016-02-13 NOTE — Patient Instructions (Signed)
   Eat a small amount of food, or a nutritious drink every 2-3 hours. Set an alarm to remind you to eat, have some foods close by, such as cheese slices or sticks, fruit, pudding, crackers.   Drink some Ensure or other drink, like Premier Protein, carnation breakfast drink, milkshake, or chocolate milk. Or eat frozen yogurt or ice cream.   You can also buy some unflavored protein powder and add it to mashed potatoes, pudding, soup, etc.   Keep drinking Sprite, or other drinks with calories. Sip on some type of drink constantly during the day. Dehydration can cause poor appetite.

## 2016-02-13 NOTE — Progress Notes (Signed)
[place] @ [facility] Telephone:(336) [phone]  Fax:(336) [place]: 1933/11/10  MR#: [id number]  S[facility]#:[id number]  Patient Care Team: Letta Median, MD as PCP - General Clent Jacks, RN as Registered Nurse Hollice Espy, MD as Consulting Physician (Urology)  CHIEF COMPLAINT:  Chief Complaint  Patient presents with  . Carcinoma of cervix  1Cancer of cervix invading the bladder and rectum with metastases to the lymph node and liver stage IV disease.  Diagnosis by cystoscopy in March of 2017 2.  Hydronephrosis was rising serum creatinine status post bilateral percutaneous nephrostomy drainage (March of 2017). 3.Patient is starting radiation therapy carboplatinum and Taxol from January 07, 2016 4.Has been changed to carboplatinum and Abraxane from March 2,2 2017 5.  Completed radiation therapy and weekly chemotherapy on April 18 of 2017  VISIT DIAGNOSIS:     ICD-9-CM ICD-10-CM   1. Carcinoma of cervix, stage 4 (HCC) 180.9 C53.9 NM PET Image Restag (PS) Skull Base To Thigh      No history exists.    Oncology Flowsheet 12/11/2015 01/07/2016 01/14/2016 01/21/2016 01/28/2016 02/04/2016 02/12/2016  Day, Cycle - Day 1, Cycle 1 Day 1, Cycle 2 Day 1, Cycle 3 Day 1, Cycle 4 Day 1, Cycle 5 Day 1, Cycle 6  CARBOplatin (PARAPLATIN) IV - 130 mg 130 mg 130 mg - - 100 mg  dexamethasone (DECADRON) IV - 20 mg 4 mg 4 mg [ 4 mg ] 4 mg 4 mg  ondansetron (ZOFRAN) IV 4 mg - - - [ 8 mg ] - -  PACLitaxel (TAXOL) IV - 45 mg/m2 - - - - -  PACLitaxel-protein bound (ABRAXANE) IV - - 50 mg/m2 50 mg/m2 50 mg/m2 50 mg/m2 50 mg/m2  palonosetron (ALOXI) IV - 0.25 mg 0.25 mg 0.25 mg 0.25 mg 0.25 mg 0.25 mg    INTERVAL HISTORY:  82 year old African-American lady very poor historian started having lower abdominal discomfort bleeding from vagina or urine not sure at that point in time further evaluation revealed hydronephrosis cystoscopy revealed a bladder mass which on biopsy.  Revealed  invasive carcinoma with squamous differentiation. PATIENT  is here for ongoing evaluation and continuation of treatment.  He tolerated last treatment very well.  Patient is finishing up radiation therapy today and here for the last chemotherapy cycle.  For last few treatment patient has not received any carboplatinum Patient continues to have poor oral intake slightly increased serum creatinine.  Has been a poor historian denies any lower abdominal pain and discomfort Here for further follow-up and treatment consideration  REVIEW OF SYSTEMS:   Gen. status: Performance status is 2.  Not in any acute distress. She and is noncommunicative answers questions but does not participate in conversation.  Most of the questions were answered by family.  Had a port placement.  Patient has attended chemotherapy class. GI: Appetite is poor.  Lower abdominal discomfort. HEENT: No difficulty swallowing.  No headache.  No dizziness. Lungs: No shortness of breath patient has not smoked in the past. Cardiac: History of hypertension but no history of coronary artery disease GU: Had hematuria.  Hydronephrosis.  Bilateral percutaneous nephrostomy Skin: No rash Neurological system no headache no dizziness Lower extremity no swelling Musculoskeletal system no bony pain  As per HPI. Otherwise, a complete review of systems is negatve.  (All other 12 systems have been reviewed  PAST MEDICAL HISTORY: Past Medical History  Diagnosis Date  . Hypertension   . Shortness of breath dyspnea  doe  . Arthritis   . Complication of anesthesia     tonsillectomy age 74 bad experience with mask and hard to wake up  . Enlarged kidney   . Hernia of abdominal wall 12/04/2015    left side  . Acute kidney failure ([place]) 12/11/2015  . Carcinoma of cervix, stage 4 ([place]) 12/31/2015  . Abnormal weight loss   . Decreased sense of taste     PAST SURGICAL HISTORY: Past Surgical History  Procedure Laterality Date  . Tonsillectomy      . Cystoscopy w/ retrogrades Left 12/10/2015    Procedure: CYSTOSCOPY WITH RETROGRADE PYELOGRAM;  Surgeon: Hollice Espy, MD;  Location: [facility] ORS;  Service: Urology;  Laterality: Left;  . Transurethral resection of bladder tumor N/A 12/10/2015    Procedure: TRANSURETHRAL RESECTION OF BLADDER TUMOR (TURBT);  Surgeon: Hollice Espy, MD;  Location: [facility] ORS;  Service: Urology;  Laterality: N/A;  . Peripheral vascular catheterization N/A 01/03/2016    Procedure: Porta Cath Insertion;  Surgeon: Algernon Huxley, MD;  Location: [place] [facility];  Service: Cardiovascular;  Laterality: N/A;    FAMILY HISTORY Family History  Problem Relation Age of Onset  . Cancer Mother   . Prostate cancer Neg Hx   . Bladder Cancer Neg Hx   . Kidney cancer Neg Hx     GYNECOLOGIC HISTORY:  No LMP recorded. Patient is postmenopausal.     ADVANCED DIRECTIVES:  Patient does not have any living will or healthcare power of attorney.  Information was given .  Available resources had been discussed.  We will follow-up on subsequent appointments regarding this issue  HEALTH MAINTENANCE: Social History  Substance Use Topics  . Smoking status: Never Smoker   . Smokeless tobacco: Never Used  . Alcohol Use: No       Allergies  Allergen Reactions  . Hydralazine Palpitations  . Taxol [Paclitaxel] Other (See Comments)    Shortness of breath, facial flushing, dizziness.     Current Outpatient Prescriptions  Medication Sig Dispense Refill  . amLODipine (NORVASC) 10 MG tablet Take 10 mg by mouth daily. After lunch    . aspirin 81 MG tablet Take 81 mg by mouth every morning.     . Cholecalciferol (VITAMIN D3) 50000 units CAPS Take 1 capsule by mouth once a week. Reported on 01/14/2016    . Cholecalciferol 4000 UNITS CAPS Take 4,000 Units by mouth daily. Reported on 01/14/2016    . dexamethasone (DECADRON) 4 MG tablet take 2 tablets by mouth THE NIGHT BEFORE CHEMOTHERAPY ONLY  0  . docusate sodium (COLACE) 100 MG  capsule Take 2 capsules (200 mg total) by mouth 2 (two) times daily. 120 capsule 0  . lidocaine-prilocaine (EMLA) cream Apply 1 application topically as needed. 30 g 3  . megestrol (MEGACE ES) 625 MG/5ML suspension Take 5 mLs (625 mg total) by mouth daily. 150 mL 0  . ondansetron (ZOFRAN) 4 MG tablet take 1 tablet by mouth every 6 hours if needed  0  . predniSONE (DELTASONE) 20 MG tablet Take 1 tablet (20 mg total) by mouth daily with breakfast. 30 tablet 0  . quinapril (ACCUPRIL) 20 MG tablet Take 20 mg by mouth 2 (two) times daily.     No current facility-administered medications for this visit.    OBJECTIVE: PHYSICAL EXAM: GENERAL:  Patient is not in any distress.  Has lost significant weight.  Poor historian.  Family with the patient answering most of the questions . MENTAL STATUS:  Alert  and oriented to person, place and time.  ENT:  Oropharynx clear without lesion.  Tongue normal. Mucous membranes moist.  RESPIRATORY:  Clear to auscultation without rales, wheezes or rhonchi. CARDIOVASCULAR:  Regular rate and rhythm without murmur, rub or gallop. . ABDOMEN:  Soft, non-tender, with active bowel sounds, and no hepatosplenomegaly.  No masses.  Ventral hernia BACK:  No CVA tenderness.  No tenderness on percussion of the back or rib cage. SKIN:  No rashes, ulcers or lesions. EXTREMITIES: No edema, no skin discoloration or tenderness.  No palpable cords. LYMPH NODES: No palpable cervical, supraclavicular, axillary or inguinal adenopathy  NEUROLOGICAL: Unremarkable. PSYCH:  Appropriate.  Filed Vitals:   02/12/16 1013  BP: 124/76  Pulse: 68  Temp: 96.9 F (36.1 C)  Resp: 18     Body mass index is 20.16 kg/(m^2).    ECOG FS:2 - Symptomatic, <50% confined to bed  LAB RESULTS:  Infusion on 02/12/2016  Component Date Value Ref Range Status  . WBC 02/12/2016 1.9* 3.6 - 11.0 K/uL Final  . RBC 02/12/2016 3.17* 3.80 - 5.20 MIL/uL Final  . Hemoglobin 02/12/2016 9.4* 12.0 - 16.0 g/dL  Final  . HCT 02/12/2016 27.2* 35.0 - 47.0 % Final  . MCV 02/12/2016 85.8  80.0 - 100.0 fL Final  . MCH 02/12/2016 29.5  26.0 - 34.0 pg Final  . MCHC 02/12/2016 34.4  32.0 - 36.0 g/dL Final  . RDW 02/12/2016 15.7* 11.5 - 14.5 % Final  . Platelets 02/12/2016 233  150 - 440 K/uL Final  . Neutrophils Relative % 02/12/2016 79   Final  . Neutro Abs 02/12/2016 1.5  1.4 - 6.5 K/uL Final  . Lymphocytes Relative 02/12/2016 15   Final  . Lymphs Abs 02/12/2016 0.3* 1.0 - 3.6 K/uL Final  . Monocytes Relative 02/12/2016 5   Final  . Monocytes Absolute 02/12/2016 0.1* 0.2 - 0.9 K/uL Final  . Eosinophils Relative 02/12/2016 0   Final  . Eosinophils Absolute 02/12/2016 0.0  0 - 0.7 K/uL Final  . Basophils Relative 02/12/2016 1   Final  . Basophils Absolute 02/12/2016 0.0  0 - 0.1 K/uL Final  . Sodium 02/12/2016 132* 135 - 145 mmol/L Final  . Potassium 02/12/2016 3.3* 3.5 - 5.1 mmol/L Final  . Chloride 02/12/2016 100* 101 - 111 mmol/L Final  . CO2 02/12/2016 26  22 - 32 mmol/L Final  . Glucose, Bld 02/12/2016 183* 65 - 99 mg/dL Final  . BUN 02/12/2016 31* 6 - 20 mg/dL Final  . Creatinine, Ser 02/12/2016 1.75* 0.44 - 1.00 mg/dL Final  . Calcium 02/12/2016 9.3  8.9 - 10.3 mg/dL Final  . Total Protein 02/12/2016 6.7  6.5 - 8.1 g/dL Final  . Albumin 02/12/2016 3.1* 3.5 - 5.0 g/dL Final  . AST 02/12/2016 18  15 - 41 U/L Final  . ALT 02/12/2016 11* 14 - 54 U/L Final  . Alkaline Phosphatase 02/12/2016 100  38 - 126 U/L Final  . Total Bilirubin 02/12/2016 0.6  0.3 - 1.2 mg/dL Final  . GFR calc non Af Amer 02/12/2016 27* >60 mL/min Final  . GFR calc Af Amer 02/12/2016 31* >60 mL/min Final   Comment: (NOTE) The eGFR has been calculated using the CKD EPI equation. This calculation has not been validated in all clinical situations. eGFR's persistently <60 mL/min signify possible Chronic Kidney Disease.   . Anion gap 02/12/2016 6  5 - 15 Final  . Magnesium 02/12/2016 2.3  1.7 - 2.4 mg/dL Final  STUDIES: No results found.  ASSESSMENT: Carcinoma of cervix.  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 Patient has finished radiation therapy as well as last chemotherapy today.  We will continue carboplatinum and Abraxane.  Overall improvement in terms of pain as well as vaginal bleeding has stopped.  We will repeat PET scan which has been scheduled for in 2 weeks.  Possibility off continuing chemotherapy if patient has responded to a platinum and Abraxane Then possibility of starting carboplatin with AUC of 4 or 5 and Abraxane on day 1 and day 8 schedule to improve the tolerance.  8 0-1 0 0 mg/m She does have a liver metastases This has been discussed with the patient and family.  They understand that.  Reevaluation after PET scan .    Carcinoma of cervix, stage 4 ([place])   Staging form: Cervix Uteri, AJCC 7th Edition     Clinical: FIGO Stage IVB (T4, N1, M1) - Signed by Forest Gleason, MD on 12/31/2015   Forest Gleason, MD   02/13/2016 8:36 AM

## 2016-02-14 ENCOUNTER — Ambulatory Visit (INDEPENDENT_AMBULATORY_CARE_PROVIDER_SITE_OTHER): Payer: Medicare Other | Admitting: Urology

## 2016-02-14 ENCOUNTER — Encounter: Payer: Self-pay | Admitting: Urology

## 2016-02-14 VITALS — BP 101/62 | HR 93 | Ht 63.0 in | Wt 116.0 lb

## 2016-02-14 DIAGNOSIS — N19 Unspecified kidney failure: Secondary | ICD-10-CM

## 2016-02-14 DIAGNOSIS — C539 Malignant neoplasm of cervix uteri, unspecified: Secondary | ICD-10-CM | POA: Diagnosis not present

## 2016-02-14 NOTE — Progress Notes (Signed)
9:25 AM  02/14/2016  Tonya Moreno 1934-08-16 [id number]  Referring provider: Letta Median, MD [place],  [phone]  Chief Complaint  Patient presents with  . Hydronephrosis    tube mangagement    HPI: 82 year old female initially referred for incidental right hydronephrosis and gross hematuria found to have an infiltrating bladder mass obstructing the trigone.  She was taken to the operating room on 10/08/2016 at which time she underwent transurethral resection of her bladder tumor.  Bimanual exam in the OR also revealed a fixed tumor involving the anterior vaginal wall.   In the PACU, she is relatively anuric and stat labs revealed that she was in florid renal failure.  She had a somewhat prolonged hospital admission ultimately requiring bilateral percutaneous nephrostomy tubes at which time her creatinine began to normalize.  Surgical pathology consistent with invasive carcinoma of cervical origin. CT PET shows evidence of metastasis to the liver along with significant lymphadenopathy.  Stage IVa.    She has now been seen and evaluated by Dr. Theora Gianotti, Dr. Oliva Bustard, and Dr. Baruch Gouty at the cancer center. She has now completed palliative chemotherapy and radiation for her stage IV cervical cancer earlier this week.  She is having issues with poor appetites and po intake.  Her nephrostomy tubes are working well and she his able to empty them herself.  She is voiding a small amount from her bladder.    She will be due for a PET scan in a few weeks.     Last nephrostomy exchange 01/04/16 prior to starting chemo/ rads.     PMH: Past Medical History  Diagnosis Date  . Hypertension   . Shortness of breath dyspnea     doe  . Arthritis   . Complication of anesthesia     tonsillectomy age 73 bad experience with mask and hard to wake up  . Enlarged kidney   . Hernia of abdominal wall 12/04/2015    left side  . Acute kidney failure ([place])  12/11/2015  . Carcinoma of cervix, stage 4 ([place]) 12/31/2015  . Abnormal weight loss   . Decreased sense of taste     Surgical History: Past Surgical History  Procedure Laterality Date  . Tonsillectomy    . Cystoscopy w/ retrogrades Left 12/10/2015    Procedure: CYSTOSCOPY WITH RETROGRADE PYELOGRAM;  Surgeon: Hollice Espy, MD;  Location: [facility] ORS;  Service: Urology;  Laterality: Left;  . Transurethral resection of bladder tumor N/A 12/10/2015    Procedure: TRANSURETHRAL RESECTION OF BLADDER TUMOR (TURBT);  Surgeon: Hollice Espy, MD;  Location: [facility] ORS;  Service: Urology;  Laterality: N/A;  . Peripheral vascular catheterization N/A 01/03/2016    Procedure: Porta Cath Insertion;  Surgeon: Algernon Huxley, MD;  Location: [place] [facility];  Service: Cardiovascular;  Laterality: N/A;    Home Medications:    Medication List    Notice  As of 02/14/2016  9:25 AM   You have not been prescribed any medications.      Allergies:  Allergies  Allergen Reactions  . Hydralazine Palpitations  . Taxol [Paclitaxel] Other (See Comments)    Shortness of breath, facial flushing, dizziness.     Family History: Family History  Problem Relation Age of Onset  . Cancer Mother   . Prostate cancer Neg Hx   . Bladder Cancer Neg Hx   . Kidney cancer Neg Hx     Social History:  reports that she has never smoked. She  has never used smokeless tobacco. She reports that she does not drink alcohol or use illicit drugs.  ROS: UROLOGY Frequent Urination?: No Hard to postpone urination?: No Burning/pain with urination?: No Get up at night to urinate?: Yes Leakage of urine?: Yes Urine stream starts and stops?: No Trouble starting stream?: No Do you have to strain to urinate?: No Blood in urine?: No Urinary tract infection?: No Sexually transmitted disease?: No Injury to kidneys or bladder?: No Painful intercourse?: No Weak stream?: No Currently pregnant?: No Vaginal bleeding?: No Last menstrual  period?: n  Gastrointestinal Nausea?: No Vomiting?: No Indigestion/heartburn?: No Diarrhea?: No Constipation?: No  Constitutional Fever: No Night sweats?: No Weight loss?: Yes Fatigue?: Yes  Skin Skin rash/lesions?: No Itching?: No  Eyes Blurred vision?: No Double vision?: No  Ears/Nose/Throat Sore throat?: No Sinus problems?: No  Hematologic/Lymphatic Swollen glands?: No Easy bruising?: No  Cardiovascular Leg swelling?: No Chest pain?: No  Respiratory Cough?: No Shortness of breath?: No  Endocrine Excessive thirst?: No  Musculoskeletal Back pain?: No Joint pain?: No  Neurological Headaches?: No Dizziness?: No  Psychologic Depression?: No Anxiety?: No  Physical Exam: BP 101/62 mmHg  Pulse 93  Ht 5\' 3"  (1.6 m)  Wt 116 lb (52.617 kg)  BMI 20.55 kg/m2  Constitutional:  Alert and oriented, No acute distress.  Accompanied by her daughter today.  Thinner today.   HEENT: [place] AT, moist mucus membranes.  Trachea midline, no masses. Cardiovascular: No clubbing, cyanosis, or edema. Respiratory: Normal respiratory effort, no increased work of breathing. GI: Abdomen is soft, nontender, nondistended, no abdominal masses GU: Bilateral nephrostomy tube sites clean dry and intact, draining clear yellow urine. Skin: No rashes, bruises or suspicious lesions. Neurologic: Grossly intact, no focal deficits, moving all 4 extremities. Psychiatric: Quiet, somewhat flat today  Laboratory Data: Lab Results  Component Value Date   WBC 1.9* 02/12/2016   HGB 9.4* 02/12/2016   HCT 27.2* 02/12/2016   MCV 85.8 02/12/2016   PLT 233 02/12/2016    Lab Results  Component Value Date   CREATININE 1.75* 02/12/2016    Assessment & Plan:    1. Cervical cancer, stage IVA Scheduled to initiate palliative chemo and radiation Monday.  2. Renal failure Secondary to obstructive uropathy s/p bilateral nephrostomy tube placement.  Baseline 0.91.  Most recent Cr up to 1.75  likely secondary to chemo/ dehydration.    Will arrange for nephrostomy tube exchange with antegrade nephrostogram at the time.  Pending those results along with follow up PET scan, may consider capping one or more of her nephrostomy tubes to see if her kidneys drain and she is able to void efficiently.    F/u in 6 weeks   Hollice Espy, MD  [facility] [address], [place], [place] [address] [phone]

## 2016-02-18 ENCOUNTER — Telehealth: Payer: Self-pay | Admitting: Radiology

## 2016-02-18 NOTE — Telephone Encounter (Signed)
Pt's daughter, April, notified of Bil. Nephrostomy tube exchanges scheduled 03/05/16 with arrival time to [facility] at 12:00. NPO after 4am morning of procedure including medications & pt needs a driver. Also reminded her of f/u appt with Dr Erlene Quan on 6/2 @2 :34. April voices understanding.

## 2016-02-25 ENCOUNTER — Encounter: Payer: Self-pay | Admitting: Radiation Oncology

## 2016-02-25 ENCOUNTER — Ambulatory Visit
Admission: RE | Admit: 2016-02-25 | Discharge: 2016-02-25 | Disposition: A | Discharge status: Home / Self Care | Payer: Medicare Other | Source: Ambulatory Visit | Attending: Radiation Oncology | Admitting: Radiation Oncology

## 2016-02-25 VITALS — BP 110/72 | HR 82 | Temp 97.7°F | Resp 18 | Wt 108.8 lb

## 2016-02-25 DIAGNOSIS — C539 Malignant neoplasm of cervix uteri, unspecified: Secondary | ICD-10-CM

## 2016-02-25 NOTE — Progress Notes (Signed)
Radiation Oncology Follow up Note  Name: [place]   Date:   02/25/2016 MRN:  [id number] DOB: 1934/09/30    This 82 y.o. female presents to the clinic today for follow-up for stage IV cervical cancer with liver metastases and large pelvic mass status post whole pelvic radiation.  REFERRING PROVIDER: Letta Median, MD  HPI: Patient is a 82 year old female now out 2 weeks having completed whole pelvic radiation for stage IV cervical cancer presenting with a large pelvic mass and bleeding as well as liver metastasis. She is seen today in follow-up and is doing well bleeding has stopped she's having no abdominal pain or discomfort no diarrhea or increased lower urinary tract symptoms. She is quite fatigued and has poor appetite continues to lose weight. She is scheduled for follow-up PET CT scan this week. She's currently on maintenance palliative chemotherapy with some worsening of her renal function. She's currently on Abraxane holding carboplatinum  COMPLICATIONS OF TREATMENT: none  FOLLOW UP COMPLIANCE: keeps appointments   PHYSICAL EXAM:  BP 110/72 mmHg  Pulse 82  Temp(Src) 97.7 F (36.5 C)  Resp 18  Wt 108 lb 12.8 oz (49.35 kg) Thin slightly cachectic female in NAD. Well-developed well-nourished patient in NAD. HEENT reveals PERLA, EOMI, discs not visualized.  Oral cavity is clear. No oral mucosal lesions are identified. Neck is clear without evidence of cervical or supraclavicular adenopathy. Lungs are clear to A&P. Cardiac examination is essentially unremarkable with regular rate and rhythm without murmur rub or thrill. Abdomen is benign with no organomegaly or masses noted. Motor sensory and DTR levels are equal and symmetric in the upper and lower extremities. Cranial nerves II through XII are grossly intact. Proprioception is intact. No peripheral adenopathy or edema is identified. No motor or sensory levels are noted. Crude visual fields are within normal  range.  RADIOLOGY RESULTS: PET CT scans will be reviewed.  PLAN: At this time based on the large pelvic mass and bleeding would like to boost her vaginal apex 1200 cGy in 3 fractions using high dose rate remote afterloading a vaginal cylinder. Risks and benefits of that procedure have been explained to the patient and her daughter. Side effects including possible diarrhea vaginal stenosis fatigue all were discussed in detail with the patient and her daughter. I personally set up and ordered CT simulation later this week. Will follow-up with her PET CT scan which is also be been performed this week. Certainly the large size of this mass I believe further additional high dose rate remote afterloading will give additional benefit in palliation as discussed above. I also believe in light of ability to deliver substantial doses of chemotherapy palliation of this large pelvic mass may be difficult. Have discussed this with medical oncology.  I would like to take this opportunity for allowing me to participate in the care of your patient.Armstead Peaks., MD

## 2016-02-27 ENCOUNTER — Ambulatory Visit
Admission: RE | Admit: 2016-02-27 | Discharge: 2016-02-27 | Disposition: A | Discharge status: Home / Self Care | Payer: Medicare Other | Source: Ambulatory Visit | Attending: Radiation Oncology | Admitting: Radiation Oncology

## 2016-02-27 DIAGNOSIS — Z510 Encounter for antineoplastic radiation therapy: Secondary | ICD-10-CM | POA: Diagnosis not present

## 2016-02-28 ENCOUNTER — Telehealth: Payer: Self-pay | Admitting: *Deleted

## 2016-02-28 ENCOUNTER — Ambulatory Visit
Admission: RE | Admit: 2016-02-28 | Discharge: 2016-02-28 | Disposition: A | Discharge status: Home / Self Care | Payer: Medicare Other | Source: Ambulatory Visit | Attending: Oncology | Admitting: Oncology

## 2016-02-28 DIAGNOSIS — C539 Malignant neoplasm of cervix uteri, unspecified: Secondary | ICD-10-CM

## 2016-02-28 NOTE — Telephone Encounter (Signed)
Asking that referral be sent for home health.

## 2016-02-29 ENCOUNTER — Ambulatory Visit
Admission: RE | Admit: 2016-02-29 | Discharge: 2016-02-29 | Disposition: A | Discharge status: Home / Self Care | Payer: Medicare Other | Source: Ambulatory Visit | Attending: Oncology | Admitting: Oncology

## 2016-02-29 DIAGNOSIS — R918 Other nonspecific abnormal finding of lung field: Secondary | ICD-10-CM | POA: Insufficient documentation

## 2016-02-29 DIAGNOSIS — R590 Localized enlarged lymph nodes: Secondary | ICD-10-CM | POA: Insufficient documentation

## 2016-02-29 DIAGNOSIS — C539 Malignant neoplasm of cervix uteri, unspecified: Secondary | ICD-10-CM | POA: Insufficient documentation

## 2016-02-29 DIAGNOSIS — C787 Secondary malignant neoplasm of liver and intrahepatic bile duct: Secondary | ICD-10-CM | POA: Diagnosis present

## 2016-02-29 LAB — GLUCOSE, CAPILLARY: GLUCOSE-CAPILLARY: 92 mg/dL (ref 65–99)

## 2016-02-29 MED ORDER — FLUDEOXYGLUCOSE F - 18 (FDG) INJECTION
12.8300 | Freq: Once | INTRAVENOUS | Status: AC | PRN
  Administered 2016-02-29: 12.83 via INTRAVENOUS

## 2016-02-29 NOTE — Telephone Encounter (Signed)
Called back to ask that a referral be sent to [facility] using the web site "[email or web address] " mcd forms

## 2016-03-03 ENCOUNTER — Ambulatory Visit: Admission: RE | Admit: 2016-03-03 | Payer: Medicare Other | Source: Ambulatory Visit | Admitting: Radiation Oncology

## 2016-03-03 ENCOUNTER — Inpatient Hospital Stay: Admission: RE | Admit: 2016-03-03 | Payer: Self-pay | Source: Ambulatory Visit

## 2016-03-03 DIAGNOSIS — Z510 Encounter for antineoplastic radiation therapy: Secondary | ICD-10-CM | POA: Diagnosis not present

## 2016-03-03 NOTE — Telephone Encounter (Signed)
Form has been obtained and will be filled out and faxed to [facility].

## 2016-03-04 ENCOUNTER — Inpatient Hospital Stay
Admission: RE | Admit: 2016-03-04 | Discharge: 2016-03-04 | Disposition: A | Discharge status: Home / Self Care | Payer: Medicare Other | Source: Ambulatory Visit | Attending: Radiation Oncology | Admitting: Radiation Oncology

## 2016-03-04 ENCOUNTER — Inpatient Hospital Stay: Payer: Medicare Other

## 2016-03-04 ENCOUNTER — Other Ambulatory Visit: Payer: Self-pay | Admitting: Family Medicine

## 2016-03-04 ENCOUNTER — Ambulatory Visit: Payer: Medicare Other

## 2016-03-04 ENCOUNTER — Inpatient Hospital Stay: Payer: Medicare Other | Attending: Oncology

## 2016-03-04 ENCOUNTER — Ambulatory Visit
Admission: RE | Admit: 2016-03-04 | Discharge: 2016-03-04 | Disposition: A | Discharge status: Home / Self Care | Payer: Medicare Other | Source: Ambulatory Visit | Attending: Radiation Oncology | Admitting: Radiation Oncology

## 2016-03-04 ENCOUNTER — Ambulatory Visit: Payer: Medicare Other | Admitting: Radiation Oncology

## 2016-03-04 ENCOUNTER — Telehealth: Payer: Self-pay | Admitting: *Deleted

## 2016-03-04 DIAGNOSIS — M129 Arthropathy, unspecified: Secondary | ICD-10-CM | POA: Insufficient documentation

## 2016-03-04 DIAGNOSIS — R634 Abnormal weight loss: Secondary | ICD-10-CM | POA: Diagnosis not present

## 2016-03-04 DIAGNOSIS — R531 Weakness: Secondary | ICD-10-CM | POA: Insufficient documentation

## 2016-03-04 DIAGNOSIS — R439 Unspecified disturbances of smell and taste: Secondary | ICD-10-CM | POA: Insufficient documentation

## 2016-03-04 DIAGNOSIS — R630 Anorexia: Secondary | ICD-10-CM

## 2016-03-04 DIAGNOSIS — N1330 Unspecified hydronephrosis: Secondary | ICD-10-CM | POA: Insufficient documentation

## 2016-03-04 DIAGNOSIS — K439 Ventral hernia without obstruction or gangrene: Secondary | ICD-10-CM | POA: Diagnosis not present

## 2016-03-04 DIAGNOSIS — Z923 Personal history of irradiation: Secondary | ICD-10-CM | POA: Insufficient documentation

## 2016-03-04 DIAGNOSIS — C539 Malignant neoplasm of cervix uteri, unspecified: Secondary | ICD-10-CM

## 2016-03-04 DIAGNOSIS — Z9221 Personal history of antineoplastic chemotherapy: Secondary | ICD-10-CM | POA: Insufficient documentation

## 2016-03-04 DIAGNOSIS — C779 Secondary and unspecified malignant neoplasm of lymph node, unspecified: Secondary | ICD-10-CM | POA: Insufficient documentation

## 2016-03-04 DIAGNOSIS — C785 Secondary malignant neoplasm of large intestine and rectum: Secondary | ICD-10-CM | POA: Diagnosis not present

## 2016-03-04 DIAGNOSIS — R0602 Shortness of breath: Secondary | ICD-10-CM | POA: Insufficient documentation

## 2016-03-04 DIAGNOSIS — Z809 Family history of malignant neoplasm, unspecified: Secondary | ICD-10-CM | POA: Insufficient documentation

## 2016-03-04 DIAGNOSIS — C7911 Secondary malignant neoplasm of bladder: Secondary | ICD-10-CM | POA: Diagnosis not present

## 2016-03-04 DIAGNOSIS — I10 Essential (primary) hypertension: Secondary | ICD-10-CM | POA: Diagnosis not present

## 2016-03-04 DIAGNOSIS — C787 Secondary malignant neoplasm of liver and intrahepatic bile duct: Secondary | ICD-10-CM | POA: Diagnosis not present

## 2016-03-04 DIAGNOSIS — R5383 Other fatigue: Secondary | ICD-10-CM | POA: Diagnosis not present

## 2016-03-04 LAB — CBC WITH DIFFERENTIAL/PLATELET
BASOS PCT: 0 %
Basophils Absolute: 0 10*3/uL (ref 0–0.1)
EOS ABS: 0 10*3/uL (ref 0–0.7)
EOS PCT: 0 %
HCT: 30.6 % — ABNORMAL LOW (ref 35.0–47.0)
Hemoglobin: 10.4 g/dL — ABNORMAL LOW (ref 12.0–16.0)
LYMPHS ABS: 0.4 10*3/uL — AB (ref 1.0–3.6)
Lymphocytes Relative: 4 %
MCH: 29.2 pg (ref 26.0–34.0)
MCHC: 34 g/dL (ref 32.0–36.0)
MCV: 85.9 fL (ref 80.0–100.0)
MONO ABS: 0.4 10*3/uL (ref 0.2–0.9)
MONOS PCT: 5 %
Neutro Abs: 7.9 10*3/uL — ABNORMAL HIGH (ref 1.4–6.5)
Neutrophils Relative %: 91 %
PLATELETS: 119 10*3/uL — AB (ref 150–440)
RBC: 3.56 MIL/uL — ABNORMAL LOW (ref 3.80–5.20)
RDW: 18.3 % — AB (ref 11.5–14.5)
WBC: 8.7 10*3/uL (ref 3.6–11.0)

## 2016-03-04 LAB — COMPREHENSIVE METABOLIC PANEL
ALBUMIN: 2.7 g/dL — AB (ref 3.5–5.0)
ALK PHOS: 94 U/L (ref 38–126)
ALT: 10 U/L — ABNORMAL LOW (ref 14–54)
AST: 12 U/L — ABNORMAL LOW (ref 15–41)
Anion gap: 11 (ref 5–15)
BUN: 60 mg/dL — ABNORMAL HIGH (ref 6–20)
CHLORIDE: 96 mmol/L — AB (ref 101–111)
CO2: 25 mmol/L (ref 22–32)
Calcium: 10.3 mg/dL (ref 8.9–10.3)
Creatinine, Ser: 2.12 mg/dL — ABNORMAL HIGH (ref 0.44–1.00)
GFR calc non Af Amer: 21 mL/min — ABNORMAL LOW (ref >60)
GFR, EST AFRICAN AMERICAN: 24 mL/min — AB (ref >60)
Glucose, Bld: 122 mg/dL — ABNORMAL HIGH (ref 65–99)
Potassium: 3 mmol/L — ABNORMAL LOW (ref 3.5–5.1)
SODIUM: 132 mmol/L — AB (ref 135–145)
Total Bilirubin: 0.9 mg/dL (ref 0.3–1.2)
Total Protein: 6.6 g/dL (ref 6.5–8.1)

## 2016-03-04 LAB — MAGNESIUM: Magnesium: 2.6 mg/dL — ABNORMAL HIGH (ref 1.7–2.4)

## 2016-03-04 MED ORDER — SODIUM CHLORIDE 0.9 % IV SOLN
Freq: Once | INTRAVENOUS | Status: AC
  Administered 2016-03-04: 12:00:00 via INTRAVENOUS
  Filled 2016-03-04: qty 2

## 2016-03-04 MED ORDER — HEPARIN SOD (PORK) LOCK FLUSH 100 UNIT/ML IV SOLN
500.0000 [IU] | Freq: Once | INTRAVENOUS | Status: AC
  Administered 2016-03-04: 500 [IU] via INTRAVENOUS

## 2016-03-04 MED ORDER — SODIUM CHLORIDE 0.9 % IV SOLN
Freq: Once | INTRAVENOUS | Status: AC
  Administered 2016-03-04: 11:00:00 via INTRAVENOUS
  Filled 2016-03-04: qty 1000

## 2016-03-04 MED ORDER — SODIUM CHLORIDE 0.9 % IV SOLN
INTRAVENOUS | Status: DC
  Administered 2016-03-04: 11:00:00 via INTRAVENOUS
  Filled 2016-03-04: qty 1000

## 2016-03-04 MED ORDER — SODIUM CHLORIDE 0.9% FLUSH
10.0000 mL | INTRAVENOUS | Status: DC | PRN
  Administered 2016-03-04: 10 mL via INTRAVENOUS
  Filled 2016-03-04: qty 10

## 2016-03-04 NOTE — Telephone Encounter (Signed)
Not eating anything, will not even attempt to eat and not drinking anything but a little Sprite. Very weak, took 20 mins to open door to let her ride in this morning. Feels she is dehydrated. Per L Herring, AGNP-C come in for Labs and IVF after XRT. April informed of this adn patient is coming in

## 2016-03-05 ENCOUNTER — Ambulatory Visit: Admission: RE | Admit: 2016-03-05 | Payer: Medicare Other | Source: Ambulatory Visit

## 2016-03-06 ENCOUNTER — Ambulatory Visit
Admission: RE | Admit: 2016-03-06 | Discharge: 2016-03-06 | Disposition: A | Discharge status: Home / Self Care | Payer: Medicare Other | Source: Ambulatory Visit | Attending: Urology | Admitting: Urology

## 2016-03-06 ENCOUNTER — Inpatient Hospital Stay: Payer: Medicare Other

## 2016-03-06 ENCOUNTER — Encounter: Payer: Self-pay | Admitting: Oncology

## 2016-03-06 ENCOUNTER — Inpatient Hospital Stay (HOSPITAL_BASED_OUTPATIENT_CLINIC_OR_DEPARTMENT_OTHER): Payer: Medicare Other | Admitting: Oncology

## 2016-03-06 VITALS — BP 89/57 | HR 81 | Temp 96.2°F | Resp 18 | Wt 113.4 lb

## 2016-03-06 DIAGNOSIS — C539 Malignant neoplasm of cervix uteri, unspecified: Secondary | ICD-10-CM

## 2016-03-06 DIAGNOSIS — Z809 Family history of malignant neoplasm, unspecified: Secondary | ICD-10-CM | POA: Insufficient documentation

## 2016-03-06 DIAGNOSIS — R0602 Shortness of breath: Secondary | ICD-10-CM | POA: Diagnosis not present

## 2016-03-06 DIAGNOSIS — R634 Abnormal weight loss: Secondary | ICD-10-CM

## 2016-03-06 DIAGNOSIS — C7911 Secondary malignant neoplasm of bladder: Secondary | ICD-10-CM | POA: Diagnosis not present

## 2016-03-06 DIAGNOSIS — C785 Secondary malignant neoplasm of large intestine and rectum: Secondary | ICD-10-CM

## 2016-03-06 DIAGNOSIS — R0600 Dyspnea, unspecified: Secondary | ICD-10-CM | POA: Insufficient documentation

## 2016-03-06 DIAGNOSIS — I10 Essential (primary) hypertension: Secondary | ICD-10-CM | POA: Diagnosis not present

## 2016-03-06 DIAGNOSIS — N19 Unspecified kidney failure: Secondary | ICD-10-CM

## 2016-03-06 DIAGNOSIS — R439 Unspecified disturbances of smell and taste: Secondary | ICD-10-CM

## 2016-03-06 DIAGNOSIS — Z8541 Personal history of malignant neoplasm of cervix uteri: Secondary | ICD-10-CM | POA: Insufficient documentation

## 2016-03-06 DIAGNOSIS — M1990 Unspecified osteoarthritis, unspecified site: Secondary | ICD-10-CM | POA: Insufficient documentation

## 2016-03-06 DIAGNOSIS — Z923 Personal history of irradiation: Secondary | ICD-10-CM | POA: Diagnosis not present

## 2016-03-06 DIAGNOSIS — Z888 Allergy status to other drugs, medicaments and biological substances status: Secondary | ICD-10-CM | POA: Diagnosis not present

## 2016-03-06 DIAGNOSIS — R531 Weakness: Secondary | ICD-10-CM | POA: Diagnosis not present

## 2016-03-06 DIAGNOSIS — C787 Secondary malignant neoplasm of liver and intrahepatic bile duct: Secondary | ICD-10-CM | POA: Diagnosis not present

## 2016-03-06 DIAGNOSIS — R630 Anorexia: Secondary | ICD-10-CM

## 2016-03-06 DIAGNOSIS — Z9221 Personal history of antineoplastic chemotherapy: Secondary | ICD-10-CM

## 2016-03-06 DIAGNOSIS — N1330 Unspecified hydronephrosis: Secondary | ICD-10-CM

## 2016-03-06 DIAGNOSIS — M129 Arthropathy, unspecified: Secondary | ICD-10-CM | POA: Diagnosis not present

## 2016-03-06 DIAGNOSIS — C779 Secondary and unspecified malignant neoplasm of lymph node, unspecified: Secondary | ICD-10-CM

## 2016-03-06 DIAGNOSIS — K439 Ventral hernia without obstruction or gangrene: Secondary | ICD-10-CM

## 2016-03-06 DIAGNOSIS — R5383 Other fatigue: Secondary | ICD-10-CM

## 2016-03-06 LAB — CREATININE, SERUM
CREATININE: 2.16 mg/dL — AB (ref 0.44–1.00)
GFR calc Af Amer: 24 mL/min — ABNORMAL LOW (ref >60)
GFR calc non Af Amer: 21 mL/min — ABNORMAL LOW (ref >60)

## 2016-03-06 LAB — HEMOGLOBIN: Hemoglobin: 10.2 g/dL — ABNORMAL LOW (ref 12.0–16.0)

## 2016-03-06 LAB — BUN: BUN: 58 mg/dL — AB (ref 6–20)

## 2016-03-06 MED ORDER — IOHEXOL 300 MG/ML  SOLN
30.0000 mL | Freq: Once | INTRAMUSCULAR | Status: AC | PRN
  Administered 2016-03-06: 10 mL via INTRAVENOUS

## 2016-03-06 MED ORDER — PREDNISONE 20 MG PO TABS: 20.0000 mg | ORAL_TABLET | Freq: Every day | ORAL | Status: DC

## 2016-03-06 MED ORDER — CITALOPRAM HYDROBROMIDE 10 MG PO TABS: 10.0000 mg | ORAL_TABLET | Freq: Every day | ORAL | Status: DC

## 2016-03-06 NOTE — Progress Notes (Signed)
Tonya Moreno @ [facility] [phone]  Fax:([phone]) [place]: Aug 08, 1934  MR#: [id number]  GUR#:[id number]  Patient Care Team: Letta Median, MD as PCP - General Clent Jacks, RN as Registered Nurse Hollice Espy, MD as Consulting Physician (Urology)  CHIEF COMPLAINT:  Chief Complaint  Patient presents with  . 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  Diagnosis by cystoscopy in March of 2017 2.  Hydronephrosis was rising serum creatinine status post bilateral percutaneous nephrostomy drainage (March of 2017). 3.Patient is starting radiation therapy carboplatinum and Taxol from January 07, 2016 4.Has been changed to carboplatinum and Abraxane from March 2,2 2017 5.  Completed radiation therapy and weekly chemotherapy on April 18 of 2017  VISIT DIAGNOSIS:   No diagnosis found.    No history exists.    Oncology Flowsheet 01/07/2016 01/14/2016 01/21/2016 01/28/2016 02/04/2016 02/12/2016 03/04/2016  Day, Cycle Day 1, Cycle 1 Day 1, Cycle 2 Day 1, Cycle 3 Day 1, Cycle 4 Day 1, Cycle 5 Day 1, Cycle 6 -  CARBOplatin (PARAPLATIN) IV 130 mg 130 mg 130 mg - - 100 mg -  dexamethasone (DECADRON) IV 20 mg 4 mg 4 mg [ 4 mg ] 4 mg 4 mg [ 10 mg ]  ondansetron (ZOFRAN) IV - - - [ 8 mg ] - - [ 4 mg ]  PACLitaxel (TAXOL) IV 45 mg/m2 - - - - - -  PACLitaxel-protein bound (ABRAXANE) IV - 50 mg/m2 50 mg/m2 50 mg/m2 50 mg/m2 50 mg/m2 -  palonosetron (ALOXI) IV 0.25 mg 0.25 mg 0.25 mg 0.25 mg 0.25 mg 0.25 mg -    INTERVAL HISTORY:  82 year old African-American lady very poor historian started having lower abdominal discomfort bleeding from vagina or urine not sure at that point in time further evaluation revealed hydronephrosis cystoscopy revealed a bladder mass which on biopsy.  Revealed invasive carcinoma with squamous differentiation. Patient is here for ongoing evaluation and  treatment consideration.  Patient had a repeat CT scan shows excellent response in the liver as well as in pelvic mass.  However general can she has been declining poor appetite.  Patient could not afford Megace as insurance refused to pay.  Appetite remains were . patient received IV fluid review and creatinine is still rising.  Patient is feeling weak.  Tired.  REVIEW OF SYSTEMS:   Gen. status: Performance status is 2.  Not in any acute distress. She and is noncommunicative answers questions but does not participate in conversation.  Most of the questions were answered by family.  Had a port placement.  Patient has attended chemotherapy class. GI: Appetite is poor.  Lower abdominal discomfort. HEENT: No difficulty swallowing.  No headache.  No dizziness. Lungs: No shortness of breath patient has not smoked in the past. Cardiac: History of hypertension but no history of coronary artery disease GU: Had hematuria.  Hydronephrosis.  Bilateral percutaneous nephrostomy Skin: No rash Neurological system no headache no dizziness Lower extremity no swelling Musculoskeletal system no bony pain  As per HPI. Otherwise, a complete review of systems is negatve.  (All other 12 systems have been reviewed  PAST MEDICAL HISTORY: Past Medical History  Diagnosis Date  . Hypertension   . Shortness of breath dyspnea     doe  . Arthritis   . Complication of anesthesia     tonsillectomy age 53 bad experience with mask and hard  to wake up  . Enlarged kidney   . Hernia of abdominal wall 12/04/2015    left side  . Acute kidney failure ([place]) 12/11/2015  . Carcinoma of cervix, stage 4 ([place]) 12/31/2015  . Abnormal weight loss   . Decreased sense of taste     PAST SURGICAL HISTORY: Past Surgical History  Procedure Laterality Date  . Tonsillectomy    . Cystoscopy w/ retrogrades Left 12/10/2015    Procedure: CYSTOSCOPY WITH RETROGRADE PYELOGRAM;  Surgeon: Hollice Espy, MD;  Location: [facility];  Service:  Urology;  Laterality: Left;  . Transurethral resection of bladder tumor N/A 12/10/2015    Procedure: TRANSURETHRAL RESECTION OF BLADDER TUMOR (TURBT);  Surgeon: Hollice Espy, MD;  Location: [facility];  Service: Urology;  Laterality: N/A;  . Peripheral vascular catheterization N/A 01/03/2016    Procedure: Porta Cath Insertion;  Surgeon: Algernon Huxley, MD;  Location: [facility];  Service: Cardiovascular;  Laterality: N/A;    FAMILY HISTORY Family History  Problem Relation Age of Onset  . Cancer Mother   . Prostate cancer Neg Hx   . Bladder Cancer Neg Hx   . Kidney cancer Neg Hx     GYNECOLOGIC HISTORY:  No LMP recorded. Patient is postmenopausal.     ADVANCED DIRECTIVES:  Patient does not have any living will or healthcare power of attorney.  Information was given .  Available resources had been discussed.  We will follow-up on subsequent appointments regarding this issue  HEALTH MAINTENANCE: Social History  Substance Use Topics  . Smoking status: Never Smoker   . Smokeless tobacco: Never Used  . Alcohol Use: No       Allergies  Allergen Reactions  . Hydralazine Palpitations  . Taxol [Paclitaxel] Other (See Comments)    Shortness of breath, facial flushing, dizziness.     No current outpatient prescriptions on file.   No current facility-administered medications for this visit.    OBJECTIVE: PHYSICAL EXAM: GENERAL:  Patient is not in any distress.  Has lost significant weight.  Poor historian.  Family with the patient answering most of the questions . MENTAL STATUS:  Alert and oriented to person, place and time.  ENT:  Oropharynx clear without lesion.  Tongue normal. Mucous membranes Are dry. RESPIRATORY:  Clear to auscultation without rales, wheezes or rhonchi. CARDIOVASCULAR:  Regular rate and rhythm without murmur, rub or gallop. . ABDOMEN:  Soft, non-tender, with active bowel sounds, and no hepatosplenomegaly.  No masses.  Ventral hernia BACK:  No CVA  tenderness.  No tenderness on percussion of the back or rib cage. SKIN:  No rashes, ulcers or lesions. EXTREMITIES: No edema, no skin discoloration or tenderness.  No palpable cords. LYMPH NODES: No palpable cervical, supraclavicular, axillary or inguinal adenopathy  NEUROLOGICAL: Unremarkable. PSYCH:  Appropriate.  Filed Vitals:   03/06/16 1102  BP: 89/57  Pulse: 81  Temp: 96.2 F (35.7 C)  Resp: 18     Body mass index is 20.1 kg/(m^2).    ECOG FS:2 - Symptomatic, <50% confined to bed  LAB RESULTS:  Infusion on 03/04/2016  Component Date Value Ref Range Status  . WBC 03/04/2016 8.7  3.6 - 11.0 K/uL Final  . RBC 03/04/2016 3.56* 3.80 - 5.20 MIL/uL Final  . Hemoglobin 03/04/2016 10.4* 12.0 - 16.0 g/dL Final  . HCT 03/04/2016 30.6* 35.0 - 47.0 % Final  . MCV 03/04/2016 85.9  80.0 - 100.0 fL Final  . MCH 03/04/2016 29.2  26.0 - 34.0 pg Final  .  MCHC 03/04/2016 34.0  32.0 - 36.0 g/dL Final  . RDW 03/04/2016 18.3* 11.5 - 14.5 % Final  . Platelets 03/04/2016 119* 150 - 440 K/uL Final  . Neutrophils Relative % 03/04/2016 91   Final  . Neutro Abs 03/04/2016 7.9* 1.4 - 6.5 K/uL Final  . Lymphocytes Relative 03/04/2016 4   Final  . Lymphs Abs 03/04/2016 0.4* 1.0 - 3.6 K/uL Final  . Monocytes Relative 03/04/2016 5   Final  . Monocytes Absolute 03/04/2016 0.4  0.2 - 0.9 K/uL Final  . Eosinophils Relative 03/04/2016 0   Final  . Eosinophils Absolute 03/04/2016 0.0  0 - 0.7 K/uL Final  . Basophils Relative 03/04/2016 0   Final  . Basophils Absolute 03/04/2016 0.0  0 - 0.1 K/uL Final  . Sodium 03/04/2016 132* 135 - 145 mmol/L Final  . Potassium 03/04/2016 3.0* 3.5 - 5.1 mmol/L Final  . Chloride 03/04/2016 96* 101 - 111 mmol/L Final  . CO2 03/04/2016 25  22 - 32 mmol/L Final  . Glucose, Bld 03/04/2016 122* 65 - 99 mg/dL Final  . BUN 03/04/2016 60* 6 - 20 mg/dL Final  . Creatinine, Ser 03/04/2016 2.12* 0.44 - 1.00 mg/dL Final  . Calcium 03/04/2016 10.3  8.9 - 10.3 mg/dL Final  . Total  Protein 03/04/2016 6.6  6.5 - 8.1 g/dL Final  . Albumin 03/04/2016 2.7* 3.5 - 5.0 g/dL Final  . AST 03/04/2016 12* 15 - 41 U/L Final  . ALT 03/04/2016 10* 14 - 54 U/L Final  . Alkaline Phosphatase 03/04/2016 94  38 - 126 U/L Final  . Total Bilirubin 03/04/2016 0.9  0.3 - 1.2 mg/dL Final  . GFR calc non Af Amer 03/04/2016 21* >60 mL/min Final  . GFR calc Af Amer 03/04/2016 24* >60 mL/min Final   Comment: (NOTE) The eGFR has been calculated using the CKD EPI equation. This calculation has not been validated in all clinical situations. eGFR's persistently <60 mL/min signify possible Chronic Kidney Disease.   . Anion gap 03/04/2016 11  5 - 15 Final  . Magnesium 03/04/2016 2.6* 1.7 - 2.4 mg/dL Final     STUDIES: Nm Pet Image Restag (ps) Skull Base To Thigh  02/29/2016  CLINICAL DATA:  Subsequent treatment strategy for cervical carcinoma. EXAM: NUCLEAR MEDICINE PET SKULL BASE TO THIGH TECHNIQUE: 12.8 mCi F-18 FDG was injected intravenously. Full-ring PET imaging was performed from the skull base to thigh after the radiotracer. CT data was obtained and used for attenuation correction and anatomic localization. FASTING BLOOD GLUCOSE:  Value: 92 mg/dl COMPARISON:  PET-CT 12/21/2015 FINDINGS: NECK No hypermetabolic lymph nodes in the neck. CHEST There is a hypermetabolic focus noted in the left upper lobe but no CT correlate is identified. This is most likely artifact related to injection technique. There are a few small scattered pulmonary nodules noted on the CT scan which appears stable. Attention on future scans is suggested. Airspace opacity in the right lower lobe is metabolically active and likely pneumonia. Vague airspace nodularity seen at the right base on the prior study is much improved. No enlarged or hypermetabolic mediastinal or hilar lymph nodes. ABDOMEN/PELVIS Markedly improved appearance of the liver metastasis. The lesions are much smaller and markedly less hypermetabolic. A segment 5  lesion has an SUV max of 3.3 and was previously 8.4. Stable bilateral hydronephrosis with bilateral nephrostomy tubes. The cervical cancer mass is much smaller and markedly less hypermetabolic. It previously measured approximately 7 x 5 cm and SUV max was 18.8. It  now measures 5.5 x 3.2 cm and SUV max is 8.0. 15 mm left pelvic sidewall lymph node now measures 8 mm. Prior SUV max was 6.0 and is now all 2.3. SKELETON No focal hypermetabolic activity to suggest skeletal metastasis. IMPRESSION: 1. Regression of cervical mass, local adenopathy and metastatic hepatic disease. 2. Small bilateral pulmonary nodules appears stable. Continue observation recommend. 3. Hypermetabolic focus in the left upper lobe is likely artifactual as there is no CT correlate. Electronically Signed   By: Marijo Sanes M.D.   On: 02/29/2016 14:09    ASSESSMENT: Carcinoma of cervix.  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 Patient has finished radiation therapy as well as last chemotherapy today.  We will continue carboplatinum and Abraxane.  Overall improvement in terms of pain as well as vaginal bleeding has stopped.  Repeat PET scan has been reviewed independently so significant response. Patient is still continuing post radiation therapy Complaints of poor oral intake. BUN/creatinine is still high will proceed with IV fluid hold chemotherapy. Increase prednisone to 20 mg daily to see whether appetite can be improved. Will have to check whether patient's nephrostomy tube placed which changed The patient's general condition improves the possibility of carbo and Abraxane is to be considered.  Carcinoma of cervix, stage 4 ([place])   Staging form: Cervix Uteri, AJCC 7th Edition     Clinical: FIGO Stage IVB (T4, N1, M1) - Signed by Forest Gleason, MD on 12/31/2015   Forest Gleason, MD   03/06/2016 11:53 AM

## 2016-03-06 NOTE — Progress Notes (Signed)
Patient states she is sleepy.  BP 89/59.  HR 81.  Family member with patient today.  Wants to speak with MD regarding home hospice.

## 2016-03-07 ENCOUNTER — Inpatient Hospital Stay: Payer: Medicare Other

## 2016-03-07 ENCOUNTER — Ambulatory Visit
Admission: RE | Admit: 2016-03-07 | Discharge: 2016-03-07 | Disposition: A | Discharge status: Home / Self Care | Payer: Medicare Other | Source: Ambulatory Visit | Attending: Radiation Oncology | Admitting: Radiation Oncology

## 2016-03-07 VITALS — BP 103/73 | HR 68 | Temp 96.5°F | Resp 20

## 2016-03-07 DIAGNOSIS — I10 Essential (primary) hypertension: Secondary | ICD-10-CM | POA: Diagnosis not present

## 2016-03-07 DIAGNOSIS — M129 Arthropathy, unspecified: Secondary | ICD-10-CM | POA: Diagnosis not present

## 2016-03-07 DIAGNOSIS — C787 Secondary malignant neoplasm of liver and intrahepatic bile duct: Secondary | ICD-10-CM | POA: Diagnosis not present

## 2016-03-07 DIAGNOSIS — R634 Abnormal weight loss: Secondary | ICD-10-CM | POA: Diagnosis not present

## 2016-03-07 DIAGNOSIS — R531 Weakness: Secondary | ICD-10-CM | POA: Diagnosis not present

## 2016-03-07 DIAGNOSIS — R5383 Other fatigue: Secondary | ICD-10-CM | POA: Diagnosis not present

## 2016-03-07 DIAGNOSIS — R0602 Shortness of breath: Secondary | ICD-10-CM | POA: Diagnosis not present

## 2016-03-07 DIAGNOSIS — N1330 Unspecified hydronephrosis: Secondary | ICD-10-CM | POA: Diagnosis not present

## 2016-03-07 DIAGNOSIS — C539 Malignant neoplasm of cervix uteri, unspecified: Secondary | ICD-10-CM | POA: Diagnosis not present

## 2016-03-07 DIAGNOSIS — N179 Acute kidney failure, unspecified: Secondary | ICD-10-CM

## 2016-03-07 DIAGNOSIS — Z809 Family history of malignant neoplasm, unspecified: Secondary | ICD-10-CM | POA: Diagnosis not present

## 2016-03-07 DIAGNOSIS — Z9221 Personal history of antineoplastic chemotherapy: Secondary | ICD-10-CM | POA: Diagnosis not present

## 2016-03-07 DIAGNOSIS — K439 Ventral hernia without obstruction or gangrene: Secondary | ICD-10-CM | POA: Diagnosis not present

## 2016-03-07 DIAGNOSIS — C7911 Secondary malignant neoplasm of bladder: Secondary | ICD-10-CM | POA: Diagnosis not present

## 2016-03-07 DIAGNOSIS — Z510 Encounter for antineoplastic radiation therapy: Secondary | ICD-10-CM | POA: Diagnosis not present

## 2016-03-07 DIAGNOSIS — R439 Unspecified disturbances of smell and taste: Secondary | ICD-10-CM | POA: Diagnosis not present

## 2016-03-07 DIAGNOSIS — N170 Acute kidney failure with tubular necrosis: Secondary | ICD-10-CM

## 2016-03-07 DIAGNOSIS — C785 Secondary malignant neoplasm of large intestine and rectum: Secondary | ICD-10-CM | POA: Diagnosis not present

## 2016-03-07 DIAGNOSIS — R630 Anorexia: Secondary | ICD-10-CM | POA: Diagnosis not present

## 2016-03-07 DIAGNOSIS — C779 Secondary and unspecified malignant neoplasm of lymph node, unspecified: Secondary | ICD-10-CM | POA: Diagnosis not present

## 2016-03-07 DIAGNOSIS — Z923 Personal history of irradiation: Secondary | ICD-10-CM | POA: Diagnosis not present

## 2016-03-07 MED ORDER — HEPARIN SOD (PORK) LOCK FLUSH 100 UNIT/ML IV SOLN
INTRAVENOUS | Status: AC
  Filled 2016-03-07: qty 5

## 2016-03-07 MED ORDER — SODIUM CHLORIDE 0.9% FLUSH
10.0000 mL | Freq: Once | INTRAVENOUS | Status: AC
  Administered 2016-03-07: 10 mL via INTRAVENOUS
  Filled 2016-03-07: qty 10

## 2016-03-07 MED ORDER — SODIUM CHLORIDE 0.9 % IV SOLN
Freq: Once | INTRAVENOUS | Status: AC
  Administered 2016-03-07: 11:00:00 via INTRAVENOUS
  Filled 2016-03-07: qty 1000

## 2016-03-07 MED ORDER — SODIUM CHLORIDE 0.9 % IV SOLN
8.0000 mg | Freq: Once | INTRAVENOUS | Status: AC
  Administered 2016-03-07: 8 mg via INTRAVENOUS
  Filled 2016-03-07: qty 0.8

## 2016-03-07 MED ORDER — HEPARIN SOD (PORK) LOCK FLUSH 100 UNIT/ML IV SOLN
500.0000 [IU] | Freq: Once | INTRAVENOUS | Status: AC
  Administered 2016-03-07: 500 [IU] via INTRAVENOUS

## 2016-03-09 ENCOUNTER — Encounter: Payer: Self-pay | Admitting: Oncology

## 2016-03-10 ENCOUNTER — Ambulatory Visit
Admission: RE | Admit: 2016-03-10 | Discharge: 2016-03-10 | Disposition: A | Discharge status: Home / Self Care | Payer: Medicare Other | Source: Ambulatory Visit | Attending: Radiation Oncology | Admitting: Radiation Oncology

## 2016-03-10 DIAGNOSIS — Z510 Encounter for antineoplastic radiation therapy: Secondary | ICD-10-CM | POA: Diagnosis not present

## 2016-03-11 ENCOUNTER — Encounter: Payer: Self-pay | Admitting: *Deleted

## 2016-03-11 ENCOUNTER — Ambulatory Visit: Payer: Medicare Other | Admitting: Radiation Oncology

## 2016-03-12 ENCOUNTER — Emergency Department: Payer: Medicare Other

## 2016-03-12 ENCOUNTER — Encounter: Payer: Self-pay | Admitting: Emergency Medicine

## 2016-03-12 ENCOUNTER — Inpatient Hospital Stay
Admission: EM | Admit: 2016-03-12 | Discharge: 2016-03-14 | DRG: 689 | Disposition: A | Discharge status: Hospice | Payer: Medicare Other | Attending: Internal Medicine | Admitting: Internal Medicine

## 2016-03-12 DIAGNOSIS — D638 Anemia in other chronic diseases classified elsewhere: Secondary | ICD-10-CM | POA: Diagnosis present

## 2016-03-12 DIAGNOSIS — C785 Secondary malignant neoplasm of large intestine and rectum: Secondary | ICD-10-CM | POA: Diagnosis present

## 2016-03-12 DIAGNOSIS — N179 Acute kidney failure, unspecified: Secondary | ICD-10-CM | POA: Diagnosis not present

## 2016-03-12 DIAGNOSIS — C787 Secondary malignant neoplasm of liver and intrahepatic bile duct: Secondary | ICD-10-CM | POA: Diagnosis present

## 2016-03-12 DIAGNOSIS — R55 Syncope and collapse: Secondary | ICD-10-CM

## 2016-03-12 DIAGNOSIS — E860 Dehydration: Secondary | ICD-10-CM | POA: Diagnosis present

## 2016-03-12 DIAGNOSIS — I129 Hypertensive chronic kidney disease with stage 1 through stage 4 chronic kidney disease, or unspecified chronic kidney disease: Secondary | ICD-10-CM | POA: Diagnosis present

## 2016-03-12 DIAGNOSIS — F32A Depression, unspecified: Secondary | ICD-10-CM

## 2016-03-12 DIAGNOSIS — B961 Klebsiella pneumoniae [K. pneumoniae] as the cause of diseases classified elsewhere: Secondary | ICD-10-CM | POA: Diagnosis present

## 2016-03-12 DIAGNOSIS — E43 Unspecified severe protein-calorie malnutrition: Secondary | ICD-10-CM | POA: Insufficient documentation

## 2016-03-12 DIAGNOSIS — Z888 Allergy status to other drugs, medicaments and biological substances status: Secondary | ICD-10-CM

## 2016-03-12 DIAGNOSIS — F329 Major depressive disorder, single episode, unspecified: Secondary | ICD-10-CM

## 2016-03-12 DIAGNOSIS — N183 Chronic kidney disease, stage 3 (moderate): Secondary | ICD-10-CM | POA: Diagnosis present

## 2016-03-12 DIAGNOSIS — Z681 Body mass index (BMI) 19 or less, adult: Secondary | ICD-10-CM

## 2016-03-12 DIAGNOSIS — N390 Urinary tract infection, site not specified: Secondary | ICD-10-CM | POA: Diagnosis not present

## 2016-03-12 DIAGNOSIS — R7881 Bacteremia: Secondary | ICD-10-CM | POA: Diagnosis present

## 2016-03-12 DIAGNOSIS — I951 Orthostatic hypotension: Secondary | ICD-10-CM | POA: Diagnosis present

## 2016-03-12 DIAGNOSIS — C7911 Secondary malignant neoplasm of bladder: Secondary | ICD-10-CM | POA: Diagnosis present

## 2016-03-12 DIAGNOSIS — Z515 Encounter for palliative care: Secondary | ICD-10-CM | POA: Diagnosis present

## 2016-03-12 DIAGNOSIS — B9689 Other specified bacterial agents as the cause of diseases classified elsewhere: Secondary | ICD-10-CM | POA: Diagnosis present

## 2016-03-12 DIAGNOSIS — R627 Adult failure to thrive: Secondary | ICD-10-CM | POA: Diagnosis present

## 2016-03-12 DIAGNOSIS — K5730 Diverticulosis of large intestine without perforation or abscess without bleeding: Secondary | ICD-10-CM | POA: Diagnosis present

## 2016-03-12 DIAGNOSIS — C539 Malignant neoplasm of cervix uteri, unspecified: Secondary | ICD-10-CM | POA: Diagnosis present

## 2016-03-12 LAB — PHOSPHORUS: PHOSPHORUS: 2.4 mg/dL — AB (ref 2.5–4.6)

## 2016-03-12 LAB — URINALYSIS COMPLETE WITH MICROSCOPIC (ARMC ONLY)
Bilirubin Urine: NEGATIVE
GLUCOSE, UA: NEGATIVE mg/dL
Ketones, ur: NEGATIVE mg/dL
NITRITE: NEGATIVE
PROTEIN: 100 mg/dL — AB
SPECIFIC GRAVITY, URINE: 1.013 (ref 1.005–1.030)
Squamous Epithelial / LPF: NONE SEEN
pH: 7 (ref 5.0–8.0)

## 2016-03-12 LAB — CBC
HEMATOCRIT: 25.5 % — AB (ref 35.0–47.0)
Hemoglobin: 8.4 g/dL — ABNORMAL LOW (ref 12.0–16.0)
MCH: 28.8 pg (ref 26.0–34.0)
MCHC: 33 g/dL (ref 32.0–36.0)
MCV: 87.3 fL (ref 80.0–100.0)
Platelets: 115 10*3/uL — ABNORMAL LOW (ref 150–440)
RBC: 2.92 MIL/uL — ABNORMAL LOW (ref 3.80–5.20)
RDW: 19.2 % — AB (ref 11.5–14.5)
WBC: 10.6 10*3/uL (ref 3.6–11.0)

## 2016-03-12 LAB — HEPATIC FUNCTION PANEL
ALBUMIN: 2.3 g/dL — AB (ref 3.5–5.0)
ALT: 8 U/L — AB (ref 14–54)
AST: 12 U/L — AB (ref 15–41)
Alkaline Phosphatase: 79 U/L (ref 38–126)
BILIRUBIN DIRECT: 0.2 mg/dL (ref 0.1–0.5)
Indirect Bilirubin: 0.6 mg/dL (ref 0.3–0.9)
Total Bilirubin: 0.8 mg/dL (ref 0.3–1.2)
Total Protein: 5.7 g/dL — ABNORMAL LOW (ref 6.5–8.1)

## 2016-03-12 LAB — BASIC METABOLIC PANEL
Anion gap: 6 (ref 5–15)
BUN: 48 mg/dL — AB (ref 6–20)
CALCIUM: 10 mg/dL (ref 8.9–10.3)
CO2: 27 mmol/L (ref 22–32)
Chloride: 107 mmol/L (ref 101–111)
Creatinine, Ser: 1.8 mg/dL — ABNORMAL HIGH (ref 0.44–1.00)
GFR calc Af Amer: 30 mL/min — ABNORMAL LOW (ref >60)
GFR, EST NON AFRICAN AMERICAN: 26 mL/min — AB (ref >60)
GLUCOSE: 144 mg/dL — AB (ref 65–99)
Potassium: 3.1 mmol/L — ABNORMAL LOW (ref 3.5–5.1)
Sodium: 140 mmol/L (ref 135–145)

## 2016-03-12 LAB — URIC ACID: URIC ACID, SERUM: 7.2 mg/dL — AB (ref 2.3–6.6)

## 2016-03-12 LAB — LIPASE, BLOOD: LIPASE: 20 U/L (ref 11–51)

## 2016-03-12 LAB — MAGNESIUM: Magnesium: 2.2 mg/dL (ref 1.7–2.4)

## 2016-03-12 MED ORDER — ACETAMINOPHEN 325 MG PO TABS: 650.0000 mg | ORAL_TABLET | Freq: Four times a day (QID) | ORAL | Status: DC | PRN

## 2016-03-12 MED ORDER — SODIUM CHLORIDE 0.9% FLUSH
3.0000 mL | Freq: Two times a day (BID) | INTRAVENOUS | Status: DC
  Administered 2016-03-13: 3 mL via INTRAVENOUS

## 2016-03-12 MED ORDER — SODIUM CHLORIDE 0.9 % IV BOLUS (SEPSIS)
1000.0000 mL | Freq: Once | INTRAVENOUS | Status: AC
  Administered 2016-03-12: 1000 mL via INTRAVENOUS

## 2016-03-12 MED ORDER — DEXTROSE 5 % IV SOLN
1.0000 g | INTRAVENOUS | Status: DC
  Filled 2016-03-12: qty 10

## 2016-03-12 MED ORDER — SODIUM CHLORIDE 0.9 % IV SOLN
INTRAVENOUS | Status: DC
  Administered 2016-03-12 – 2016-03-14 (×4): via INTRAVENOUS

## 2016-03-12 MED ORDER — DEXTROSE 5 % IV SOLN
1.0000 g | Freq: Once | INTRAVENOUS | Status: AC
  Administered 2016-03-12: 1 g via INTRAVENOUS
  Filled 2016-03-12: qty 10

## 2016-03-12 MED ORDER — MORPHINE SULFATE (PF) 2 MG/ML IV SOLN: 2.0000 mg | INTRAVENOUS | Status: DC | PRN

## 2016-03-12 MED ORDER — ACETAMINOPHEN 650 MG RE SUPP: 650.0000 mg | Freq: Four times a day (QID) | RECTAL | Status: DC | PRN

## 2016-03-12 MED ORDER — OXYCODONE HCL 5 MG PO TABS: 5.0000 mg | ORAL_TABLET | ORAL | Status: DC | PRN

## 2016-03-12 MED ORDER — HEPARIN SODIUM (PORCINE) 5000 UNIT/ML IJ SOLN
5000.0000 [IU] | Freq: Three times a day (TID) | INTRAMUSCULAR | Status: DC
  Administered 2016-03-12 – 2016-03-13 (×2): 5000 [IU] via SUBCUTANEOUS
  Filled 2016-03-12: qty 1

## 2016-03-12 NOTE — ED Notes (Signed)
Pt presents to ED via EMS for witnessed syncopal episode that lasted 3 minutes. Pt suffering for bladder, rectums that metastasizes to liver, stage IV. Pt denies pain at this time. Bilateral nephrostomy in place.

## 2016-03-12 NOTE — H&P (Signed)
[facility] [place] NAME: Tonya Moreno    MR#:  [id number]  DATE OF BIRTH:  11/22/1933   DATE OF ADMISSION:  03/12/2016  PRIMARY CARE PHYSICIAN: Letta Median, MD   REQUESTING/REFERRING PHYSICIAN: Edd Fabian  CHIEF COMPLAINT:   Chief Complaint  Patient presents with  . Loss of Consciousness    HISTORY OF PRESENT ILLNESS:  Tonya Moreno  is a 82 y.o. female with a known history of Cervical cancer on active chemotherapy presenting after chemotherapy infusion with syncopal episode. Patient completed her infusion today without difficulty however upon presentation back to her house she suffered a syncopal episode. She is unable to provide meaningful information at this time given mental status medical condition. History aided by son present at bedside. States she has had extremely poor appetite for some time now losing approximately 30 pounds in the last month. Once again patient unable to provide information given mental status  PAST MEDICAL HISTORY:   Past Medical History  Diagnosis Date  . Hypertension   . Shortness of breath dyspnea     doe  . Arthritis   . Complication of anesthesia     tonsillectomy age 23 bad experience with mask and hard to wake up  . Enlarged kidney   . Hernia of abdominal wall 12/04/2015    left side  . Acute kidney failure ([place]) 12/11/2015  . Carcinoma of cervix, stage 4 ([place]) 12/31/2015  . Abnormal weight loss   . Decreased sense of taste     PAST SURGICAL HISTORY:   Past Surgical History  Procedure Laterality Date  . Tonsillectomy    . Cystoscopy w/ retrogrades Left 12/10/2015    Procedure: CYSTOSCOPY WITH RETROGRADE PYELOGRAM;  Surgeon: Hollice Espy, MD;  Location: [facility];  Service: Urology;  Laterality: Left;  . Transurethral resection of bladder tumor N/A 12/10/2015    Procedure: TRANSURETHRAL RESECTION OF BLADDER TUMOR (TURBT);  Surgeon: Hollice Espy, MD;  Location: [facility];   Service: Urology;  Laterality: N/A;  . Peripheral vascular catheterization N/A 01/03/2016    Procedure: Porta Cath Insertion;  Surgeon: Algernon Huxley, MD;  Location: [facility];  Service: Cardiovascular;  Laterality: N/A;    SOCIAL HISTORY:   Social History  Substance Use Topics  . Smoking status: Never Smoker   . Smokeless tobacco: Never Used  . Alcohol Use: No    FAMILY HISTORY:   Family History  Problem Relation Age of Onset  . Cancer Mother   . Prostate cancer Neg Hx   . Bladder Cancer Neg Hx   . Kidney cancer Neg Hx     DRUG ALLERGIES:   Allergies  Allergen Reactions  . Hydralazine Palpitations  . Taxol [Paclitaxel] Shortness Of Breath and Other (See Comments)    Reaction:  Facial flushing/dizziness     REVIEW OF SYSTEMS:  REVIEW OF SYSTEMS:  Unable to obtain given patient's mental status   MEDICATIONS AT HOME:   Prior to Admission medications   Medication Sig Start Date End Date Taking? Authorizing Provider  citalopram (CELEXA) 10 MG tablet Take 1 tablet (10 mg total) by mouth at bedtime. 03/06/16   Forest Gleason, MD  predniSONE (DELTASONE) 20 MG tablet Take 1 tablet (20 mg total) by mouth daily with breakfast. 03/06/16   Forest Gleason, MD      VITAL SIGNS:  Blood pressure 159/87, pulse 60, resp. rate 15, SpO2 100 %.  PHYSICAL EXAMINATION:  VITAL SIGNS: Filed Vitals:  03/12/16 1600 03/12/16 1730  BP: 140/77 159/87  Pulse:  60  Resp: 12 15   GENERAL:81 y.o.female currently in Moderate acute distress. Chronically ill appearing HEAD: Normocephalic, atraumatic.  EYES: Pupils equal, round, reactive to light. Extraocular muscles intact. No scleral icterus.  MOUTH: Moist mucosal membrane. Dentition intact. No abscess noted.  EAR, NOSE, THROAT: Clear without exudates. No external lesions.  NECK: Supple. No thyromegaly. No nodules. No JVD.  PULMONARY: Clear to ascultation, without wheeze rails or rhonci. No use of accessory muscles, Good respiratory  effort. good air entry bilaterally CHEST: Nontender to palpation.  CARDIOVASCULAR: S1 and S2. Regular rate and rhythm. No murmurs, rubs, or gallops. No edema. Pedal pulses 2+ bilaterally.  GASTROINTESTINAL: Soft, nontender, nondistended. No masses. Positive bowel sounds. No hepatosplenomegaly.  MUSCULOSKELETAL: No swelling, clubbing, or edema. Range of motion full in all extremities.  NEUROLOGIC: Unable to fully assess this patient has difficulty following commands Cranial nerves II through XII are intact. No gross focal neurological deficits. Sensation intact. Reflexes intact.  SKIN: No ulceration, lesions, rashes, or cyanosis. Skin warm and dry. Turgor intact.  PSYCHIATRIC: Mood, affect flat. The patient is awake, alert and oriented self. Insight, judgment poor.    LABORATORY PANEL:   CBC  Recent Labs Lab 03/12/16 1250  WBC 10.6  HGB 8.4*  HCT 25.5*  PLT 115*   ------------------------------------------------------------------------------------------------------------------  Chemistries   Recent Labs Lab 03/12/16 1250  NA 140  K 3.1*  CL 107  CO2 27  GLUCOSE 144*  BUN 48*  CREATININE 1.80*  CALCIUM 10.0  MG 2.2  AST 12*  ALT 8*  ALKPHOS 79  BILITOT 0.8   ------------------------------------------------------------------------------------------------------------------  Cardiac Enzymes No results for input(s): TROPONINI in the last 168 hours. ------------------------------------------------------------------------------------------------------------------  RADIOLOGY:  Dg Chest 2 View  03/12/2016  CLINICAL DATA:  Syncope. EXAM: CHEST  2 VIEW COMPARISON:  September 09, 2014. FINDINGS: The heart size and mediastinal contours are within normal limits. Both lungs are clear. No pneumothorax or pleural effusion is noted. Right internal jugular Port-A-Cath is noted with distal tip in expected position of the SVC. The visualized skeletal structures are unremarkable.  IMPRESSION: No active cardiopulmonary disease. Electronically Signed   By: Marijo Conception, M.D.   On: 03/12/2016 16:42   Ct Head Wo Contrast  03/12/2016  CLINICAL DATA:  Syncope. EXAM: CT HEAD WITHOUT CONTRAST TECHNIQUE: Contiguous axial images were obtained from the base of the skull through the vertex without intravenous contrast. COMPARISON:  CT scan of September 07, 2006. FINDINGS: Bony calvarium appears intact. Mild diffuse cortical atrophy is noted. Mild chronic ischemic white matter disease is noted. No mass effect or midline shift is noted. Ventricular size is within normal limits. There is no evidence of mass lesion, hemorrhage or acute infarction. IMPRESSION: Mild diffuse cortical atrophy. Mild chronic ischemic white matter disease. No acute intracranial abnormality seen. Electronically Signed   By: Marijo Conception, M.D.   On: 03/12/2016 16:33    EKG:   Orders placed or performed during the hospital encounter of 03/12/16  . ED EKG  . ED EKG  . EKG 12-Lead  . EKG 12-Lead    IMPRESSION AND PLAN:   82 year old African-American female history of cervical cancer on chemotherapy presenting after syncopal episode  1. Syncope: Likely orthostatic in etiology check with cellulitis signs, gentle IV fluid hydration, telemetry 2. Urinary tract infection site unspecified: Ceftriaxone and follow urine culture and adjust antibiotics accordingly 3. Cervical cancer with failure to thrive: Consult oncology for  continuation of care we'll also consult palliative care I discussed extensively with son at bedside in regards to [place] and overall poor prognosis particularly when patient stopped eating at this time he wishes to remain full code and will continue to discuss   All the records are reviewed and case discussed with ED provider. Management plans discussed with the patient, family and they are in agreement.  CODE STATUS: Full  TOTAL TIME TAKING CARE OF THIS PATIENT: 41 minutes.     Keith Felten,  Karenann Cai.D on 03/12/2016 at 7:11 PM  Between 7am to 6pm - Pager - [phone]  After 6pm: House Pager: - [phone]  [facility]  Office  [phone]  CC: Primary care physician; Letta Median, MD

## 2016-03-12 NOTE — ED Notes (Signed)
Pt. Arrived to [facility] via [facility]. Daughter at the bedside reports that pt. Slumped to the right side when in the chair and "passed out". Daughter reports that pt. Was unable to talk or move for "a while" after she came to. Pt. Denies complaints at this time, full ROM noted. Pt. Frail and daughter reports pt. Hasn't eaten anything in two weeks, with a weight loss of 30 pounds in the past month and a half. Daughter reports pt. Will only drink Pepsi. Pt. Receiving tx through power port in Rt. Chest for cervical CA with mets.

## 2016-03-12 NOTE — ED Notes (Signed)
Rocephin requested from the pharmacy.

## 2016-03-12 NOTE — ED Provider Notes (Signed)
[facility] Emergency Department Provider Note   ____________________________________________  Time seen: Approximately 3:07 PM  I have reviewed the triage vital signs and the nursing notes.   HISTORY  Chief Complaint Loss of Consciousness  Caveat-history of present illness and review of systems is limited due to the patient's confusion. All information is obtained from her family at bedside.  HPI Tonya Moreno is a 82 y.o. female with cervical cancer invading the bladder and rectum with metastases to the lymph node and liver stage IV disease on chemotherapy and radiation followed by Dr. Oliva Bustard, hypertension presents for evaluation of a syncopal episode today. Her family reports that somebody was wheeling the patient up a ramp, she was seated in the rolling walker when she fainted. This lasted for approximately 3 minutes. She did not fall or injure herself. The patient has very little recollection of the days events. She denies any current pain complaints. According to her family, she has had some runny nose over the past few days. She has had poor appetite for several weeks with a 30 pound weight loss over the past 6 weeks. She has not been eating or drinking well.   Past Medical History  Diagnosis Date  . Hypertension   . Shortness of breath dyspnea     doe  . Arthritis   . Complication of anesthesia     tonsillectomy age 76 bad experience with mask and hard to wake up  . Enlarged kidney   . Hernia of abdominal wall 12/04/2015    left side  . Acute kidney failure ([place]) 12/11/2015  . Carcinoma of cervix, stage 4 ([place]) 12/31/2015  . Abnormal weight loss   . Decreased sense of taste     Patient Active Problem List   Diagnosis Date Noted  . Carcinoma of cervix, stage 4 ([place]) 12/31/2015  . Acute kidney failure ([place]) 12/11/2015  . Hydronephrosis   . Bladder mass 12/10/2015  . Abnormal ECG 04/21/2014  . Breathlessness on exertion 04/21/2014  . BP  (high blood pressure) 04/21/2014    Past Surgical History  Procedure Laterality Date  . Tonsillectomy    . Cystoscopy w/ retrogrades Left 12/10/2015    Procedure: CYSTOSCOPY WITH RETROGRADE PYELOGRAM;  Surgeon: Hollice Espy, MD;  Location: [facility] ORS;  Service: Urology;  Laterality: Left;  . Transurethral resection of bladder tumor N/A 12/10/2015    Procedure: TRANSURETHRAL RESECTION OF BLADDER TUMOR (TURBT);  Surgeon: Hollice Espy, MD;  Location: [facility] ORS;  Service: Urology;  Laterality: N/A;  . Peripheral vascular catheterization N/A 01/03/2016    Procedure: Porta Cath Insertion;  Surgeon: Algernon Huxley, MD;  Location: [facility];  Service: Cardiovascular;  Laterality: N/A;    Current Outpatient Rx  Name  Route  Sig  Dispense  Refill  . citalopram (CELEXA) 10 MG tablet   Oral   Take 1 tablet (10 mg total) by mouth at bedtime.   30 tablet   3   . predniSONE (DELTASONE) 20 MG tablet   Oral   Take 1 tablet (20 mg total) by mouth daily with breakfast.   30 tablet   3     Allergies Hydralazine and Taxol  Family History  Problem Relation Age of Onset  . Cancer Mother   . Prostate cancer Neg Hx   . Bladder Cancer Neg Hx   . Kidney cancer Neg Hx     Social History Social History  Substance Use Topics  . Smoking status: Never Smoker   .  Smokeless tobacco: Never Used  . Alcohol Use: No    Review of Systems Constitutional: No fever/chills Eyes: No visual changes. ENT: No sore throat. Cardiovascular: Denies chest pain. Respiratory: Denies shortness of breath. Gastrointestinal: No abdominal pain.  No nausea, no vomiting.  No diarrhea.  No constipation. Genitourinary: Negative for dysuria. Musculoskeletal: Negative for back pain. Skin: Negative for rash. Neurological: Negative for headaches, focal weakness or numbness.  10-point ROS otherwise negative.  Caveat-history of present illness and review of systems is limited due to the patient's confusion. All  information is obtained from her family at bedside. ____________________________________________   PHYSICAL EXAM:  VITAL SIGNS: ED Triage Vitals  Enc Vitals Group     BP 03/12/16 1315 126/71 mmHg     Pulse Rate 03/12/16 1315 60     Resp 03/12/16 1315 12     Temp --      Temp src --      SpO2 03/12/16 1304 100 %     Weight --      Height --      Head Cir --      Peak Flow --      Pain Score 03/12/16 1315 0     Pain Loc --      Pain Edu? --      Excl. in [place]? --     Constitutional: Alert and oriented To self only, not to place or year. Chronically ill-appearing and in no acute distress. Eyes: Conjunctivae are normal. PERRL. EOMI. Head: Atraumatic. Nose: No congestion/rhinnorhea. Mouth/Throat: Mucous membranes are dry.  Oropharynx non-erythematous. Neck: No stridor. Supple without meningismus. Cardiovascular: Normal rate, regular rhythm. Grossly normal heart sounds.  Good peripheral circulation. Respiratory: Normal respiratory effort.  No retractions. Lungs CTAB. Gastrointestinal: Soft and nontender. No distention.No CVA tenderness. Genitourinary: deferred Musculoskeletal: No lower extremity tenderness nor edema.  No joint effusions. Bilateral nephrostomy tubes appeared in good position and are drainging, no surrounding erythema, no drainage at the skin insertion site, no tenderness or fluctuance. Neurologic:  Normal speech and language. No gross focal neurologic deficits are appreciated. 5 out of 5 strength in bilateral upper extremities, 4 out of 5 strength in bilateral lower extremities however suspect that this is effort related. Skin:  Skin is warm, dry and intact. No rash noted. Psychiatric: Mood and affect are normal. Speech and behavior are normal.  ____________________________________________   LABS (all labs ordered are listed, but only abnormal results are displayed)  Labs Reviewed  BASIC METABOLIC PANEL - Abnormal; Notable for the following:    Potassium 3.1 (*)     Glucose, Bld 144 (*)    BUN 48 (*)    Creatinine, Ser 1.80 (*)    GFR calc non Af Amer 26 (*)    GFR calc Af Amer 30 (*)    All other components within normal limits  CBC - Abnormal; Notable for the following:    RBC 2.92 (*)    Hemoglobin 8.4 (*)    HCT 25.5 (*)    RDW 19.2 (*)    Platelets 115 (*)    All other components within normal limits  URINALYSIS COMPLETEWITH MICROSCOPIC ([facility] ONLY) - Abnormal; Notable for the following:    Color, Urine YELLOW (*)    APPearance TURBID (*)    Hgb urine dipstick 1+ (*)    Protein, ur 100 (*)    Leukocytes, UA 2+ (*)    Bacteria, UA MANY (*)    All other components within normal limits  HEPATIC FUNCTION PANEL - Abnormal; Notable for the following:    Total Protein 5.7 (*)    Albumin 2.3 (*)    AST 12 (*)    ALT 8 (*)    All other components within normal limits  PHOSPHORUS - Abnormal; Notable for the following:    Phosphorus 2.4 (*)    All other components within normal limits  URIC ACID - Abnormal; Notable for the following:    Uric Acid, Serum 7.2 (*)    All other components within normal limits  CULTURE, BLOOD (ROUTINE X 2)  CULTURE, BLOOD (ROUTINE X 2)  LIPASE, BLOOD  MAGNESIUM  URINALYSIS COMPLETEWITH MICROSCOPIC ([facility] ONLY)  CBG MONITORING, ED   ____________________________________________  EKG  ED ECG REPORT I, Joanne Gavel, the attending physician, personally viewed and interpreted this ECG.   Date: 03/12/2016  EKG Time: 12:58  Rate: 59  Rhythm: normal sinus rhythm  Axis: normal  Intervals:none  ST&T Change: No acute ST elevation. Borderline T-wave flattening in the lateral leads.  ____________________________________________  RADIOLOGY  CXR IMPRESSION: No active cardiopulmonary disease.   CT head IMPRESSION: Mild diffuse cortical atrophy. Mild chronic ischemic white matter disease. No acute intracranial abnormality  seen.  ____________________________________________   PROCEDURES  Procedure(s) performed: None  Critical Care performed: No  ____________________________________________   INITIAL IMPRESSION / ASSESSMENT AND PLAN / ED COURSE  Pertinent labs & imaging results that were available during my care of the patient were reviewed by me and considered in my medical decision making (see chart for details).  Tonya Moreno is a 82 y.o. female with cervical cancer invading the bladder and rectum with metastases to the lymph node and liver stage IV disease on chemotherapy and radiation followed by Dr. Oliva Bustard, hypertension presents for evaluation of a syncopal episode today. She is also confused and has had failure to thrive. On exam, she is confused, oriented only to self which her family reports is abnormal for her. Her vital signs are stable, she is afebrile. She has a nonfocal neurological exam though does appear to be quite weak in the legs. We will give IV fluids, obtain screening labs, chest x-ray, CT head and anticipate admission given her continued alteration in her mental state.  ----------------------------------------- 6:35 PM on 03/12/2016 ----------------------------------------- Labs reviewed. BMP is notable for mild creatinine elevation at 1.8 over this appears improved from prior. CBC with anemia, hemoglobin 8.4. Urinalysis is concerning for urinary tract infection. UA shows 2+ leukocytes, too numerous to count red and white blood cells, many bacteria and this is from the catheterized bladder specimen as discussed with the lab. We'll give IV ceftriaxone. Case discussed with hospitalist for admission at this time.  ____________________________________________   FINAL CLINICAL IMPRESSION(S) / ED DIAGNOSES  Final diagnoses:  Syncope, unspecified syncope type  UTI (lower urinary tract infection)  Failure to thrive in adult      NEW MEDICATIONS STARTED DURING THIS  VISIT:  New Prescriptions   No medications on file     Note:  This document was prepared using Dragon voice recognition software and may include unintentional dictation errors.    Joanne Gavel, MD 03/12/16 [phone]

## 2016-03-13 ENCOUNTER — Inpatient Hospital Stay: Payer: Medicare Other | Admitting: Oncology

## 2016-03-13 ENCOUNTER — Inpatient Hospital Stay
Admit: 2016-03-13 | Discharge: 2016-03-13 | Disposition: A | Discharge status: Home / Self Care | Payer: Medicare Other | Attending: Radiation Oncology | Admitting: Radiation Oncology

## 2016-03-13 ENCOUNTER — Ambulatory Visit: Payer: Medicare Other

## 2016-03-13 ENCOUNTER — Inpatient Hospital Stay: Payer: Medicare Other

## 2016-03-13 ENCOUNTER — Ambulatory Visit: Payer: Medicare Other | Admitting: Radiation Oncology

## 2016-03-13 DIAGNOSIS — F329 Major depressive disorder, single episode, unspecified: Secondary | ICD-10-CM

## 2016-03-13 DIAGNOSIS — R634 Abnormal weight loss: Secondary | ICD-10-CM

## 2016-03-13 DIAGNOSIS — N179 Acute kidney failure, unspecified: Secondary | ICD-10-CM

## 2016-03-13 DIAGNOSIS — Z9221 Personal history of antineoplastic chemotherapy: Secondary | ICD-10-CM

## 2016-03-13 DIAGNOSIS — M129 Arthropathy, unspecified: Secondary | ICD-10-CM

## 2016-03-13 DIAGNOSIS — Z923 Personal history of irradiation: Secondary | ICD-10-CM

## 2016-03-13 DIAGNOSIS — R531 Weakness: Secondary | ICD-10-CM

## 2016-03-13 DIAGNOSIS — E43 Unspecified severe protein-calorie malnutrition: Secondary | ICD-10-CM | POA: Insufficient documentation

## 2016-03-13 DIAGNOSIS — C799 Secondary malignant neoplasm of unspecified site: Secondary | ICD-10-CM

## 2016-03-13 DIAGNOSIS — N2881 Hypertrophy of kidney: Secondary | ICD-10-CM

## 2016-03-13 DIAGNOSIS — C539 Malignant neoplasm of cervix uteri, unspecified: Secondary | ICD-10-CM | POA: Diagnosis not present

## 2016-03-13 DIAGNOSIS — R627 Adult failure to thrive: Secondary | ICD-10-CM

## 2016-03-13 DIAGNOSIS — K439 Ventral hernia without obstruction or gangrene: Secondary | ICD-10-CM

## 2016-03-13 DIAGNOSIS — R630 Anorexia: Secondary | ICD-10-CM

## 2016-03-13 DIAGNOSIS — E46 Unspecified protein-calorie malnutrition: Secondary | ICD-10-CM

## 2016-03-13 DIAGNOSIS — J150 Pneumonia due to Klebsiella pneumoniae: Secondary | ICD-10-CM

## 2016-03-13 DIAGNOSIS — R0602 Shortness of breath: Secondary | ICD-10-CM

## 2016-03-13 DIAGNOSIS — Z809 Family history of malignant neoplasm, unspecified: Secondary | ICD-10-CM

## 2016-03-13 DIAGNOSIS — Z515 Encounter for palliative care: Secondary | ICD-10-CM

## 2016-03-13 DIAGNOSIS — I10 Essential (primary) hypertension: Secondary | ICD-10-CM

## 2016-03-13 DIAGNOSIS — Z66 Do not resuscitate: Secondary | ICD-10-CM

## 2016-03-13 LAB — BLOOD CULTURE ID PANEL (REFLEXED)
Acinetobacter baumannii: NOT DETECTED
CANDIDA ALBICANS: NOT DETECTED
CANDIDA GLABRATA: NOT DETECTED
CANDIDA KRUSEI: NOT DETECTED
CANDIDA PARAPSILOSIS: NOT DETECTED
CANDIDA TROPICALIS: NOT DETECTED
Carbapenem resistance: NOT DETECTED
ESCHERICHIA COLI: NOT DETECTED
Enterobacter cloacae complex: NOT DETECTED
Enterobacteriaceae species: DETECTED — AB
Enterococcus species: NOT DETECTED
Haemophilus influenzae: NOT DETECTED
KLEBSIELLA OXYTOCA: NOT DETECTED
KLEBSIELLA PNEUMONIAE: DETECTED — AB
LISTERIA MONOCYTOGENES: NOT DETECTED
Methicillin resistance: NOT DETECTED
Neisseria meningitidis: NOT DETECTED
PROTEUS SPECIES: NOT DETECTED
Pseudomonas aeruginosa: NOT DETECTED
SERRATIA MARCESCENS: NOT DETECTED
STREPTOCOCCUS PNEUMONIAE: NOT DETECTED
STREPTOCOCCUS PYOGENES: NOT DETECTED
Staphylococcus aureus (BCID): NOT DETECTED
Staphylococcus species: NOT DETECTED
Streptococcus agalactiae: NOT DETECTED
Streptococcus species: NOT DETECTED
Vancomycin resistance: NOT DETECTED

## 2016-03-13 LAB — CBC
HEMATOCRIT: 24.7 % — AB (ref 35.0–47.0)
Hemoglobin: 8.1 g/dL — ABNORMAL LOW (ref 12.0–16.0)
MCH: 28.3 pg (ref 26.0–34.0)
MCHC: 32.7 g/dL (ref 32.0–36.0)
MCV: 86.4 fL (ref 80.0–100.0)
Platelets: 123 10*3/uL — ABNORMAL LOW (ref 150–440)
RBC: 2.86 MIL/uL — ABNORMAL LOW (ref 3.80–5.20)
RDW: 18.9 % — AB (ref 11.5–14.5)
WBC: 14.7 10*3/uL — ABNORMAL HIGH (ref 3.6–11.0)

## 2016-03-13 LAB — BASIC METABOLIC PANEL
Anion gap: 4 — ABNORMAL LOW (ref 5–15)
BUN: 42 mg/dL — AB (ref 6–20)
CO2: 26 mmol/L (ref 22–32)
Calcium: 9.7 mg/dL (ref 8.9–10.3)
Chloride: 110 mmol/L (ref 101–111)
Creatinine, Ser: 1.53 mg/dL — ABNORMAL HIGH (ref 0.44–1.00)
GFR calc Af Amer: 36 mL/min — ABNORMAL LOW (ref >60)
GFR, EST NON AFRICAN AMERICAN: 31 mL/min — AB (ref >60)
GLUCOSE: 96 mg/dL (ref 65–99)
POTASSIUM: 3 mmol/L — AB (ref 3.5–5.1)
Sodium: 140 mmol/L (ref 135–145)

## 2016-03-13 MED ORDER — MIRTAZAPINE 15 MG PO TABS
15.0000 mg | ORAL_TABLET | Freq: Every day | ORAL | Status: DC
  Administered 2016-03-13: 15 mg via ORAL
  Filled 2016-03-13: qty 1

## 2016-03-13 MED ORDER — POTASSIUM CHLORIDE CRYS ER 20 MEQ PO TBCR
40.0000 meq | EXTENDED_RELEASE_TABLET | Freq: Every day | ORAL | Status: AC
  Administered 2016-03-13 – 2016-03-14 (×2): 40 meq via ORAL
  Filled 2016-03-13 (×2): qty 2

## 2016-03-13 MED ORDER — ENOXAPARIN SODIUM 30 MG/0.3ML ~~LOC~~ SOLN
30.0000 mg | SUBCUTANEOUS | Status: DC
  Administered 2016-03-13: 30 mg via SUBCUTANEOUS
  Filled 2016-03-13: qty 0.3

## 2016-03-13 MED ORDER — ENSURE ENLIVE PO LIQD
237.0000 mL | Freq: Two times a day (BID) | ORAL | Status: DC
  Administered 2016-03-14: 237 mL via ORAL

## 2016-03-13 MED ORDER — ONDANSETRON HCL 4 MG/2ML IJ SOLN
4.0000 mg | Freq: Four times a day (QID) | INTRAMUSCULAR | Status: DC | PRN
  Administered 2016-03-13: 4 mg via INTRAVENOUS
  Filled 2016-03-13: qty 2

## 2016-03-13 MED ORDER — DEXTROSE 5 % IV SOLN
2.0000 g | INTRAVENOUS | Status: DC
  Administered 2016-03-13 – 2016-03-14 (×2): 2 g via INTRAVENOUS
  Filled 2016-03-13 (×2): qty 2

## 2016-03-13 NOTE — Progress Notes (Signed)
Alert but seems confused. Not answering questions appropriately. Incontinent at times. Bilateral nephrostomy tubes in draining to gravity into foley bags. On room air. IV fluids infusing. Family at side. Tele and skin verified with Cassie and Yasmin. IV antibiotics to be given.

## 2016-03-13 NOTE — Progress Notes (Signed)
Pharmacy Antibiotic Note  Tonya Moreno is a 82 y.o. female admitted on 03/12/2016 with bacteremia and UTI.    Plan: Pharmacy called with [employer] results of 4/4 bottles with Kleb pneumo. Writer call micro lab and confirmed that [place] not detected. Pt already on ceftriaxone 1 g IV q24h. Paged Dr. Benjie Karvonen to inform MD of blood culture results. Ceftriaxone dose increased to 2 g IV q24h per guidelines.  Weight: 104 lb 6.4 oz (47.356 kg)  Temp (24hrs), Avg:97.7 F (36.5 C), Min:97.5 F (36.4 C), Max:98 F (36.7 C)   Recent Labs Lab 03/12/16 1250 03/13/16 0525  WBC 10.6 14.7*  CREATININE 1.80* 1.53*    Estimated Creatinine Clearance: 22.3 mL/min (by C-G formula based on Cr of 1.53).    Allergies  Allergen Reactions  . Hydralazine Palpitations  . Taxol [Paclitaxel] Shortness Of Breath and Other (See Comments)    Reaction:  Facial flushing/dizziness     Antimicrobials this admission: Ceftriaxone 5/17 >>   Microbiology results: 5/17 BCx: Kleb pneumo, sensitivities pending  Thank you for allowing pharmacy to be a part of this patient's care.  Rocky Morel 03/13/2016 12:35 PM

## 2016-03-13 NOTE — Consult Note (Signed)
[facility] NOTE  Patient Care Team: Abby Cindy Hazy, MD as PCP - General Clent Jacks, RN as Registered Nurse Hollice Espy, MD as Consulting Physician (Urology)  CHIEF COMPLAINTS/PURPOSE OF CONSULTATION:  Cervical cancer  HISTORY OF PRESENTING ILLNESS: Patient is a poor historian/family by the bedside. Tonya Moreno 82 y.o.  female  history of metastatic cervical cancer status post bilateral nephrostomy tubes; currently s/p  chemotherapy with carboplatin and Abraxane along with radiation for local control; finished April 18th 2017; re-staging scan showed- significant response.   Patient states that she got back from her nephrologist's office on the day of admission; and then when walking with a rolling walker patient had episode of syncope; which led to admission to the hospital. CT scan of the brain without contrast negative. However further workup shows blood cultures positive for Klebsiella pneumonia. She is currently on antibiotics.  As per the family patient has poor appetite. Positive for weight loss. No significant vaginal bleeding. No nausea vomiting. She feels weak all over. No skin rash.   ROS: A complete 10 point review of system is done which is negative except mentioned above in history of present illness  MEDICAL HISTORY:  Past Medical History  Diagnosis Date  . Hypertension   . Shortness of breath dyspnea     doe  . Arthritis   . Complication of anesthesia     tonsillectomy age 8 bad experience with mask and hard to wake up  . Enlarged kidney   . Hernia of abdominal wall 12/04/2015    left side  . Acute kidney failure ([place]) 12/11/2015  . Carcinoma of cervix, stage 4 ([place]) 12/31/2015  . Abnormal weight loss   . Decreased sense of taste     SURGICAL HISTORY: Past Surgical History  Procedure Laterality Date  . Tonsillectomy    . Cystoscopy w/ retrogrades Left 12/10/2015    Procedure: CYSTOSCOPY WITH RETROGRADE PYELOGRAM;   Surgeon: Hollice Espy, MD;  Location: [facility];  Service: Urology;  Laterality: Left;  . Transurethral resection of bladder tumor N/A 12/10/2015    Procedure: TRANSURETHRAL RESECTION OF BLADDER TUMOR (TURBT);  Surgeon: Hollice Espy, MD;  Location: [facility];  Service: Urology;  Laterality: N/A;  . Peripheral vascular catheterization N/A 01/03/2016    Procedure: Porta Cath Insertion;  Surgeon: Algernon Huxley, MD;  Location: [facility];  Service: Cardiovascular;  Laterality: N/A;    SOCIAL HISTORY: Social History   Social History  . Marital Status: Single    Spouse Name: N/A  . Number of Children: N/A  . Years of Education: N/A   Occupational History  . Not on file.   Social History Main Topics  . Smoking status: Never Smoker   . Smokeless tobacco: Never Used  . Alcohol Use: No  . Drug Use: No  . Sexual Activity: No   Other Topics Concern  . Not on file   Social History Narrative    FAMILY HISTORY: Family History  Problem Relation Age of Onset  . Cancer Mother   . Prostate cancer Neg Hx   . Bladder Cancer Neg Hx   . Kidney cancer Neg Hx     ALLERGIES:  is allergic to hydralazine and taxol.  MEDICATIONS:  Current Facility-Administered Medications  Medication Dose Route Frequency Provider Last Rate Last Dose  . 0.9 %  sodium chloride infusion   Intravenous Continuous Lytle Butte, MD 100 mL/hr at 03/12/16 1957    . acetaminophen (TYLENOL)  tablet 650 mg  650 mg Oral Q6H PRN Lytle Butte, MD       Or  . acetaminophen (TYLENOL) suppository 650 mg  650 mg Rectal Q6H PRN Lytle Butte, MD      . cefTRIAXone (ROCEPHIN) 2 g in dextrose 5 % 50 mL IVPB  2 g Intravenous Q24H Bettey Costa, MD   2 g at 03/13/16 1201  . enoxaparin (LOVENOX) injection 30 mg  30 mg Subcutaneous Q24H Sital Mody, MD      . morphine 2 MG/ML injection 2 mg  2 mg Intravenous Q4H PRN Lytle Butte, MD      . ondansetron [facility]) injection 4 mg  4 mg Intravenous Q6H PRN Bettey Costa, MD   4 mg at  03/13/16 0840  . oxyCODONE (Oxy IR/ROXICODONE) immediate release tablet 5 mg  5 mg Oral Q4H PRN Lytle Butte, MD      . potassium chloride SA (K-DUR,KLOR-CON) CR tablet 40 mEq  40 mEq Oral Daily Bettey Costa, MD   40 mEq at 03/13/16 1145  . sodium chloride flush (NS) 0.9 % injection 3 mL  3 mL Intravenous Q12H Lytle Butte, MD   3 mL at 03/13/16 0031      .  PHYSICAL EXAMINATION: ECOG PERFORMANCE STATUS: 3 - Symptomatic, >50% confined to bed  Filed Vitals:   03/13/16 0546 03/13/16 1144  BP: 125/53 119/70  Pulse: 57 59  Temp: 98 F (36.7 C) 97.5 F (36.4 C)  Resp: 16 20   Filed Weights   03/12/16 2116  Weight: 104 lb 6.4 oz (47.356 kg)    GENERAL: Cachectic appearing female patient resting in the chair. Alert, no distress and comfortable. Patient appears tired. She is accompanied by multiple family members.  EYES: no pallor or icterus OROPHARYNX: no thrush or ulceration; poor dentition  NECK: supple, no masses felt LYMPH: no palpable lymphadenopathy in the cervical, axillary or inguinal regions LUNGS: clear to auscultation and No wheeze or crackles HEART/CVS: regular rate & rhythm and no murmurs; No lower extremity edema ABDOMEN:abdomen soft, non-tender and normal bowel sounds Musculoskeletal:no cyanosis of digits and no clubbing  PSYCH: alert & oriented x 3 with fluent speech NEURO: no focal motor/sensory deficits SKIN: no rashes or significant lesions; bil nephrostomy tubes.   LABORATORY DATA:  I have reviewed the data as listed Lab Results  Component Value Date   WBC 14.7* 03/13/2016   HGB 8.1* 03/13/2016   HCT 24.7* 03/13/2016   MCV 86.4 03/13/2016   PLT 123* 03/13/2016    Recent Labs  02/12/16 0922 03/04/16 1055 03/06/16 1214 03/12/16 1250 03/13/16 0525  NA 132* 132*  --  140 140  K 3.3* 3.0*  --  3.1* 3.0*  CL 100* 96*  --  107 110  CO2 26 25  --  27 26  GLUCOSE 183* 122*  --  144* 96  BUN 31* 60* 58* 48* 42*  CREATININE 1.75* 2.12* 2.16*  1.80* 1.53*  CALCIUM 9.3 10.3  --  10.0 9.7  GFRNONAA 27* 21* 21* 26* 31*  GFRAA 31* 24* 24* 30* 36*  PROT 6.7 6.6  --  5.7*  --   ALBUMIN 3.1* 2.7*  --  2.3*  --   AST 18 12*  --  12*  --   ALT 11* 10*  --  8*  --   ALKPHOS 100 94  --  79  --   BILITOT 0.6 0.9  --  0.8  --  BILIDIR  --   --   --  0.2  --   IBILI  --   --   --  0.6  --     RADIOGRAPHIC STUDIES: I have personally reviewed the radiological images as listed and agreed with the findings in the report. Dg Chest 2 View  03/12/2016  CLINICAL DATA:  Syncope. EXAM: CHEST  2 VIEW COMPARISON:  September 09, 2014. FINDINGS: The heart size and mediastinal contours are within normal limits. Both lungs are clear. No pneumothorax or pleural effusion is noted. Right internal jugular Port-A-Cath is noted with distal tip in expected position of the SVC. The visualized skeletal structures are unremarkable. IMPRESSION: No active cardiopulmonary disease. Electronically Signed   By: Marijo Conception, M.D.   On: 03/12/2016 16:42   Ct Head Wo Contrast  03/12/2016  CLINICAL DATA:  Syncope. EXAM: CT HEAD WITHOUT CONTRAST TECHNIQUE: Contiguous axial images were obtained from the base of the skull through the vertex without intravenous contrast. COMPARISON:  CT scan of September 07, 2006. FINDINGS: Bony calvarium appears intact. Mild diffuse cortical atrophy is noted. Mild chronic ischemic white matter disease is noted. No mass effect or midline shift is noted. Ventricular size is within normal limits. There is no evidence of mass lesion, hemorrhage or acute infarction. IMPRESSION: Mild diffuse cortical atrophy. Mild chronic ischemic white matter disease. No acute intracranial abnormality seen. Electronically Signed   By: Marijo Conception, M.D.   On: 03/12/2016 16:33   Nm Pet Image Restag (ps) Skull Base To Thigh  02/29/2016  CLINICAL DATA:  Subsequent treatment strategy for cervical carcinoma. EXAM: NUCLEAR MEDICINE PET SKULL BASE TO THIGH TECHNIQUE: 12.8  mCi F-18 FDG was injected intravenously. Full-ring PET imaging was performed from the skull base to thigh after the radiotracer. CT data was obtained and used for attenuation correction and anatomic localization. FASTING BLOOD GLUCOSE:  Value: 92 mg/dl COMPARISON:  PET-CT 12/21/2015 FINDINGS: NECK No hypermetabolic lymph nodes in the neck. CHEST There is a hypermetabolic focus noted in the left upper lobe but no CT correlate is identified. This is most likely artifact related to injection technique. There are a few small scattered pulmonary nodules noted on the CT scan which appears stable. Attention on future scans is suggested. Airspace opacity in the right lower lobe is metabolically active and likely pneumonia. Vague airspace nodularity seen at the right base on the prior study is much improved. No enlarged or hypermetabolic mediastinal or hilar lymph nodes. ABDOMEN/PELVIS Markedly improved appearance of the liver metastasis. The lesions are much smaller and markedly less hypermetabolic. A segment 5 lesion has an SUV max of 3.3 and was previously 8.4. Stable bilateral hydronephrosis with bilateral nephrostomy tubes. The cervical cancer mass is much smaller and markedly less hypermetabolic. It previously measured approximately 7 x 5 cm and SUV max was 18.8. It now measures 5.5 x 3.2 cm and SUV max is 8.0. 15 mm left pelvic sidewall lymph node now measures 8 mm. Prior SUV max was 6.0 and is now all 2.3. SKELETON No focal hypermetabolic activity to suggest skeletal metastasis. IMPRESSION: 1. Regression of cervical mass, local adenopathy and metastatic hepatic disease. 2. Small bilateral pulmonary nodules appears stable. Continue observation recommend. 3. Hypermetabolic focus in the left upper lobe is likely artifactual as there is no CT correlate. Electronically Signed   By: Marijo Sanes M.D.   On: 02/29/2016 14:09   Ir Nephrostogram Left Thru Existing Access  03/06/2016  INDICATION: History of cervical  carcinoma  with bilateral ureteral obstruction and chronic indwelling nephrostomy tubes. The patient requires exchange of bilateral nephrostomy tubes. EXAM: BILATERAL NEPHROSTOMY TUBE REPLACEMENT COMPARISON:  01/04/2016 MEDICATIONS: None ANESTHESIA/SEDATION: No conscious sedation was administered. CONTRAST:  35mL OMNIPAQUE IOHEXOL 300 MG/ML SOLN - administered into the collecting system(s) FLUOROSCOPY TIME:  Fluoroscopy Time: 30 seconds. COMPLICATIONS: None immediate. PROCEDURE: Informed written consent was obtained from the patient's daughter after a thorough discussion of the procedural risks, benefits and alternatives. All questions were addressed. Maximal Sterile Barrier Technique was utilized including caps, mask, sterile gowns, sterile gloves, sterile drape, hand hygiene and skin antiseptic. A timeout was performed prior to the initiation of the procedure. Both nephrostomy tubes were injected with contrast under fluoroscopy. They were then cut and removed over guidewires. New 10 French nephrostomy tubes were advanced over the guidewires. Both were formed and injected with contrast. Fluoroscopic spot images were obtained. Both catheters were connected to new gravity drainage bags. FINDINGS: Both tubes were formed at the level of the renal pelvis bilaterally. The patient has now developed chronic tracts to the skin and no longer requires routine suturing of the tubes. The sutures were also causing some degree of irritation. A trial of not suturing the tubes will be performed between this tube change and the next tube change. IMPRESSION: Exchange of bilateral chronic indwelling 10 French nephrostomy tubes. New tubes were formed in the renal pelvis bilaterally. Electronically Signed   By: Aletta Edouard M.D.   On: 03/06/2016 16:31   Ir Nephrostogram Right Thru Existing Access  03/06/2016  INDICATION: History of cervical carcinoma with bilateral ureteral obstruction and chronic indwelling nephrostomy tubes. The  patient requires exchange of bilateral nephrostomy tubes. EXAM: BILATERAL NEPHROSTOMY TUBE REPLACEMENT COMPARISON:  01/04/2016 MEDICATIONS: None ANESTHESIA/SEDATION: No conscious sedation was administered. CONTRAST:  58mL OMNIPAQUE IOHEXOL 300 MG/ML SOLN - administered into the collecting system(s) FLUOROSCOPY TIME:  Fluoroscopy Time: 30 seconds. COMPLICATIONS: None immediate. PROCEDURE: Informed written consent was obtained from the patient's daughter after a thorough discussion of the procedural risks, benefits and alternatives. All questions were addressed. Maximal Sterile Barrier Technique was utilized including caps, mask, sterile gowns, sterile gloves, sterile drape, hand hygiene and skin antiseptic. A timeout was performed prior to the initiation of the procedure. Both nephrostomy tubes were injected with contrast under fluoroscopy. They were then cut and removed over guidewires. New 10 French nephrostomy tubes were advanced over the guidewires. Both were formed and injected with contrast. Fluoroscopic spot images were obtained. Both catheters were connected to new gravity drainage bags. FINDINGS: Both tubes were formed at the level of the renal pelvis bilaterally. The patient has now developed chronic tracts to the skin and no longer requires routine suturing of the tubes. The sutures were also causing some degree of irritation. A trial of not suturing the tubes will be performed between this tube change and the next tube change. IMPRESSION: Exchange of bilateral chronic indwelling 10 French nephrostomy tubes. New tubes were formed in the renal pelvis bilaterally. Electronically Signed   By: Aletta Edouard M.D.   On: 03/06/2016 16:31   Ir Nephrostomy Exchange Left  03/06/2016  INDICATION: History of cervical carcinoma with bilateral ureteral obstruction and chronic indwelling nephrostomy tubes. The patient requires exchange of bilateral nephrostomy tubes. EXAM: BILATERAL NEPHROSTOMY TUBE REPLACEMENT  COMPARISON:  01/04/2016 MEDICATIONS: None ANESTHESIA/SEDATION: No conscious sedation was administered. CONTRAST:  40mL OMNIPAQUE IOHEXOL 300 MG/ML SOLN - administered into the collecting system(s) FLUOROSCOPY TIME:  Fluoroscopy Time: 30 seconds. COMPLICATIONS: None immediate. PROCEDURE: Informed written  consent was obtained from the patient's daughter after a thorough discussion of the procedural risks, benefits and alternatives. All questions were addressed. Maximal Sterile Barrier Technique was utilized including caps, mask, sterile gowns, sterile gloves, sterile drape, hand hygiene and skin antiseptic. A timeout was performed prior to the initiation of the procedure. Both nephrostomy tubes were injected with contrast under fluoroscopy. They were then cut and removed over guidewires. New 10 French nephrostomy tubes were advanced over the guidewires. Both were formed and injected with contrast. Fluoroscopic spot images were obtained. Both catheters were connected to new gravity drainage bags. FINDINGS: Both tubes were formed at the level of the renal pelvis bilaterally. The patient has now developed chronic tracts to the skin and no longer requires routine suturing of the tubes. The sutures were also causing some degree of irritation. A trial of not suturing the tubes will be performed between this tube change and the next tube change. IMPRESSION: Exchange of bilateral chronic indwelling 10 French nephrostomy tubes. New tubes were formed in the renal pelvis bilaterally. Electronically Signed   By: Aletta Edouard M.D.   On: 03/06/2016 16:31   Ir Nephrostomy Exchange Right  03/06/2016  INDICATION: History of cervical carcinoma with bilateral ureteral obstruction and chronic indwelling nephrostomy tubes. The patient requires exchange of bilateral nephrostomy tubes. EXAM: BILATERAL NEPHROSTOMY TUBE REPLACEMENT COMPARISON:  01/04/2016 MEDICATIONS: None ANESTHESIA/SEDATION: No conscious sedation was administered.  CONTRAST:  49mL OMNIPAQUE IOHEXOL 300 MG/ML SOLN - administered into the collecting system(s) FLUOROSCOPY TIME:  Fluoroscopy Time: 30 seconds. COMPLICATIONS: None immediate. PROCEDURE: Informed written consent was obtained from the patient's daughter after a thorough discussion of the procedural risks, benefits and alternatives. All questions were addressed. Maximal Sterile Barrier Technique was utilized including caps, mask, sterile gowns, sterile gloves, sterile drape, hand hygiene and skin antiseptic. A timeout was performed prior to the initiation of the procedure. Both nephrostomy tubes were injected with contrast under fluoroscopy. They were then cut and removed over guidewires. New 10 French nephrostomy tubes were advanced over the guidewires. Both were formed and injected with contrast. Fluoroscopic spot images were obtained. Both catheters were connected to new gravity drainage bags. FINDINGS: Both tubes were formed at the level of the renal pelvis bilaterally. The patient has now developed chronic tracts to the skin and no longer requires routine suturing of the tubes. The sutures were also causing some degree of irritation. A trial of not suturing the tubes will be performed between this tube change and the next tube change. IMPRESSION: Exchange of bilateral chronic indwelling 10 French nephrostomy tubes. New tubes were formed in the renal pelvis bilaterally. Electronically Signed   By: Aletta Edouard M.D.   On: 03/06/2016 16:31    ASSESSMENT & PLAN:   # 82 year old female patient with history of metastatic cervical cancer status post palliative chemoradiation is currently admitted to hospital for syncope/blood cultures positive for Klebsiella pneumonia infection.  # Metastatic cervical cancer- status post palliative carboplatin- Abraxane along with radiation. Finished April 2018. Restaging PET scan showed improvement response. Patient is currently getting brachytherapy; which is discontinued as  per Dr. Donella Stade. Hold off any systemic therapy given the patient acute/infection issues.  # Klebsiella pneumonia/bacteremia- question UTI. Patient is on Rocephin.  # Malnutrition/failure to thrive- await nutrition evaluation. Also agree with palliative care consult.  # Acute renal failure/chronic kidney disease/history of nephrostomy tubes- likely prerenal currently improving creatinine 1.5. Clinical concerns for any obstructive uropathy at this time.   # Depression/poor appetite recommend Remeron; stop  Celexa.  Discussed with Dr. Oliva Bustard, patient's primary oncologist. Also discussed with patient's daughter/family in detail. They agree.   Thank you Dr.Modi for allowing me to participate in the care of your pleasant patient. Please do not hesitate to contact me with questions or concerns in the interim.      Cammie Sickle, MD 03/13/2016 2:01 PM

## 2016-03-13 NOTE — Evaluation (Addendum)
Physical Therapy Evaluation Patient Details Name: Tonya Moreno MRN: [id number] DOB: April 19, 1934 Today's Date: 03/13/2016   History of Present Illness  Pt admitted for syncope after having chemotherapy. Pt with history of cervical cancer, HTN and has complaints of nausea today.  Clinical Impression  Pt is a pleasant 82 year old female who was admitted for syncope follow chemotherapy. Pt slightly confused to date, unsure of how accurate historian she is.  Pt performs bed mobility/transfers with min assist and rw and ambulation with cga and rw. Pt with B nephrostomy tubes in place, able to tie to rw.  Pt demonstrates deficits with strength/mobility. Pt is very close to baseline level per chart review and RN report. Pt will need 24/7 assist for all mobility at this time. Family has been providing this and pt living at daughter's house. Would benefit from skilled PT to address above deficits and promote optimal return to PLOF. Recommend transition to [place] upon discharge from acute hospitalization.       Follow Up Recommendations Home health PT;Supervision/Assistance - 24 hour    Equipment Recommendations       Recommendations for Other Services       Precautions / Restrictions Precautions Precautions: Fall Restrictions Weight Bearing Restrictions: No      Mobility  Bed Mobility Overal bed mobility: Needs Assistance Bed Mobility: Supine to Sit     Supine to sit: Min assist     General bed mobility comments: assist for bed mobility with heavy cues for sequencing. Once seated at EOB, pt able to sit with safe technique  Transfers Overall transfer level: Needs assistance Equipment used: Rolling walker (2 wheeled) Transfers: Sit to/from Stand Sit to Stand: Min assist         General transfer comment: assist for standing with pt able to perform upright posture. RW used for assist  Ambulation/Gait Ambulation/Gait assistance: Min guard Ambulation Distance (Feet): 3  Feet Assistive device: Rolling walker (2 wheeled) Gait Pattern/deviations: Step-to pattern     General Gait Details: ambulated with safe technique and step to gait pattern. Pt able to demonstrate upright posture and refuses further ambulation distance at this time secondary to fatigue  Stairs            Wheelchair Mobility    Modified Rankin (Stroke Patients Only)       Balance Overall balance assessment: Needs assistance Sitting-balance support: Bilateral upper extremity supported;Feet supported Sitting balance-Leahy Scale: Good     Standing balance support: Bilateral upper extremity supported Standing balance-Leahy Scale: Fair                               Pertinent Vitals/Pain Pain Assessment: No/denies pain    Home Living Family/patient expects to be discharged to:: Private residence Living Arrangements: Children (lives with daughter) Available Help at Discharge: Family Type of Home:  (lives with daughter) Home Access: Ramped entrance     Home Layout: One level Home Equipment: Environmental consultant - 4 wheels      Prior Function Level of Independence: Needs assistance   Gait / Transfers Assistance Needed: Pt reports she ambulates minimal distance with rollater     Comments: Pt reports daughter assist with bath, meals, dressing. She reports she has 24/7 assist     Hand Dominance        Extremity/Trunk Assessment   Upper Extremity Assessment: Generalized weakness (B UE grossly 3/5)  Lower Extremity Assessment: Generalized weakness (B LE grossly 3/5)         Communication   Communication: No difficulties  Cognition Arousal/Alertness: Lethargic Behavior During Therapy: WFL for tasks assessed/performed Overall Cognitive Status: Difficult to assess                      General Comments      Exercises Other Exercises Other Exercises: B LE performs supine ther-ex including ankle pumps, SLRs, and hip abd/add. All ther-ex  performed x 5 reps as she fatigues quickly. All ther-ex performed with min assist      Assessment/Plan    PT Assessment Patient needs continued PT services  PT Diagnosis Difficulty walking;Generalized weakness   PT Problem List Decreased strength;Decreased balance;Decreased mobility  PT Treatment Interventions Gait training;Therapeutic exercise   PT Goals (Current goals can be found in the Care Plan section) Acute Rehab PT Goals Patient Stated Goal: to sleep PT Goal Formulation: With patient Time For Goal Achievement: 03/27/16 Potential to Achieve Goals: Good    Frequency Min 2X/week   Barriers to discharge        Co-evaluation               End of Session   Activity Tolerance: Patient limited by fatigue Patient left: in chair;with chair alarm set Nurse Communication: Mobility status    Functional Assessment Tool Used: clinical judgement Functional Limitation: Mobility: Walking and moving around Mobility: Walking and Moving Around Current Status [id number]): At least 20 percent but less than 40 percent impaired, limited or restricted Mobility: Walking and Moving Around Goal Status [phone]): At least 1 percent but less than 20 percent impaired, limited or restricted    Time: 1040-1100 PT Time Calculation (min) (ACUTE ONLY): 20 min   Charges:   PT Evaluation $PT Eval Moderate Complexity: 1 Procedure PT Treatments $Therapeutic Exercise: 8-22 mins   PT G Codes:   PT G-Codes **NOT FOR INPATIENT CLASS** Functional Assessment Tool Used: clinical judgement Functional Limitation: Mobility: Walking and moving around Mobility: Walking and Moving Around Current Status [id number]): At least 20 percent but less than 40 percent impaired, limited or restricted Mobility: Walking and Moving Around Goal Status [phone]): At least 1 percent but less than 20 percent impaired, limited or restricted    Evaleen Sant 03/13/2016, 11:36 AM  Greggory Stallion, PT, DPT [phone]

## 2016-03-13 NOTE — Consult Note (Addendum)
Palliative Medicine Inpatient Consult Note   Name: Tonya Moreno Date: 03/13/2016 MRN: [id number]  DOB: 1934-04-26  Referring Physician: Bettey Costa, MD  Palliative Care consult requested for this 82 y.o. female for goals of medical therapy in patient with a syncopal episode. She was found to have a UTI. She has stage4 cervical cancer.    ---------------------------------------------------------------------------------------- DISCUSSIONS AND PLAN: I was able to meet with pt's two daughters.  I met pt but she had a visitor and it was agreed (with family) that it would be better for me to talk more in depth with the pt herself tomorrow morning.  Two sons were not here though one is coming back later. The other son has not been reachable for a few days (his phone is off ).  Family was educated about cachexia associated with cancer and the futility that would be involved with artificial nutrition in a patient with stage 4 cancer. They were informed that food is energy and energy is often used to make cancers worse or to tear the body down when someone has cancer. They were informed about how cancer takes away appetite and taste (so that food tastes like carpet of something out of the trash). They had earlier inquired about 'TPN' but they were much more interested in hearing the facts about the pt's situation and how close she might be to passing away.    After talking about how pt is likely not going to get more radiation and MIGHT not get more chemo, the talk turned to 'what next'.  At this point, I recommended that I call Dr. Oliva Bustard tomorrow morning and talk to him before talking to the pt.  We discussed home with hospice vs Hospice Home. Family said that pt would want to go home but she really needs someone there all the time, and that would be extremely hard if not impossible.  They asked some questions about Hospice Home but said pt might resist going anywhere but home but would really benefit  from a place like Hospice Home. They said that IF she were to go to a place like Hospice Home, it would definitely have to be in [place] (pt's home) --instead of [place], where one daughter lives.    We talked about the pt getting more days of ABX and seeing if this would make a difference in her appetite.  I said this would be reasonable, but I predict it won't make a difference in how much she eats.  Her liver has mets --and it is highly unlikely that her loss of appetite will be altered by only treating her current infection.    We will meet again tomorrow and discuss palliative options more thoroughly and more definitively --after I call Dr. Oliva Bustard.  Pt is still Full Code --so I will plan to address this with pt herself tomorrow also.   Pt denies pain, but she has a facial expression that would be consistent with either severe fatigue or a chronic low-grade pain or dicomfort.  -----------------------------------------------------------------------------  CLINICAL NARRATIVE: Tonya Moreno is a 82 yo woman with a h/o cervical cancer on active chemo who came here after she had a syncopal episode. She had returned from her nephrologist's office and was walking with a rolling walker when she had the episode of syncope. Ct of the brain was negative.  Blood cultures were found to be positive for klebsiella pneumonia that was related to a UTI. She has bilateral nephrostomy tubes.    She  has lost about 30 lbs in the last month since she has had no appetite.    She had been experiencing hematuria and a work up ultimately showed an infiltrating bladder mass. She underwent TUR of the bladder tumor. She was noted to have a fixed tumor involving the anterior vaginal wall.  She went into acute renal failure during her hospital stay in late February (here) and had to get bilateral nephrostomy tubes placed. Path was c/w muscle invasive carcinoma with squamous differentiation with staining favoring primary  cervical cancer.  She left the hospital to stay with her daughter and she was getting home health.    She had recently gotten palliative radiation with carboplatan and Abraxane along with radiation.  Restaging PET scan showed improvement response.  She was getting brachytherapy which was [facility] by Dr Donella Stade due to acute infection.   She has had a degree of acute renal failure with creatinine improving.  She is on Remeron to ehlp with her appetite and Celexa is DCd.    Apparently during a visit from the nutritionist, family brought up the question of giving pt 'TPN' since she eats so very little.  They related that someone had mentioned that TPN could be done for people who 'don't eat'.  Pt had said she would take a Magic Cup but when that was delivered to the room and put to pt's lips --she refused it. She pressed her lips together and shook her head, 'no'.    Apparently, she could not afford the [facility] Rx and insurance would not cover this. Dr Oliva Bustard is her regular oncologist and he had started her on prednisone 20 mg/day on 5/11, hoping this might improve her appetite.    ----------------------------------------------------------------------------------------------------  ACTIVE PROBLEMS: Stage 4 cervical cancer --invasive to bladder and rectum with mets to lymph node and liver --diagnosed by cystoscopy March of 2017 --radiation started January 07, 2016 and finished February 12, 2016 --chemo started December 27, 2015 --vaginal bleeding has stopped --diarrhea has stopped --CT simulation on 03/06/16 and 03/07/16 for more radiation of LARGE pelvic mass --Brachytherapy started on 03/10/16 by Dr. Baruch Gouty --now on hold Hydronephrosis ---due blockage of ureters due to invasive tumor in bladder ---s/p bilat nephrostomy tubes since March 2017 Wt Loss due to cancer Situational Depression HTN DJD ARF on CKD (recurrent) Anemia --of chronic disease and also related to chronic blood loss from hematuria and  vaginal bleeding from cancer --Hgb dropped from 10.4 to 8.4 between 5/8 and 5/17.  Severe Malnutrition --associated with stage 4 cervical cancer --albumin is 2.3 Hyopkalemia --persisting despite nl magnesium (nl due to renal dz)  -----------------------------------------------------------------------------   REVIEW OF SYSTEMS:  Patient is not able to provide ROS due to illness and weakness.   SPIRITUAL SUPPORT SYSTEM: Family is involved.  SOCIAL HISTORY:  reports that she has never smoked. She has never used smokeless tobacco. She reports that she does not drink alcohol or use illicit drugs.  Old notes state pt is a 'former social smoker and quit years ago.    LEGAL DOCUMENTS:  none  CODE STATUS: Full code  PAST MEDICAL HISTORY: Past Medical History  Diagnosis Date  . Hypertension   . Shortness of breath dyspnea     doe  . Arthritis   . Complication of anesthesia     tonsillectomy age 4 bad experience with mask and hard to wake up  . Enlarged kidney   . Hernia of abdominal wall 12/04/2015    left side  . Acute  kidney failure ([place]) 12/11/2015  . Carcinoma of cervix, stage 4 ([place]) 12/31/2015  . Abnormal weight loss   . Decreased sense of taste     PAST SURGICAL HISTORY:  Past Surgical History  Procedure Laterality Date  . Tonsillectomy    . Cystoscopy w/ retrogrades Left 12/10/2015    Procedure: CYSTOSCOPY WITH RETROGRADE PYELOGRAM;  Surgeon: Hollice Espy, MD;  Location: [facility] ORS;  Service: Urology;  Laterality: Left;  . Transurethral resection of bladder tumor N/A 12/10/2015    Procedure: TRANSURETHRAL RESECTION OF BLADDER TUMOR (TURBT);  Surgeon: Hollice Espy, MD;  Location: [facility] ORS;  Service: Urology;  Laterality: N/A;  . Peripheral vascular catheterization N/A 01/03/2016    Procedure: Porta Cath Insertion;  Surgeon: Algernon Huxley, MD;  Location: [facility];  Service: Cardiovascular;  Laterality: N/A;    ALLERGIES:  is allergic to hydralazine and  taxol.  MEDICATIONS:  Current Facility-Administered Medications  Medication Dose Route Frequency Provider Last Rate Last Dose  . 0.9 %  sodium chloride infusion   Intravenous Continuous Lytle Butte, MD 100 mL/hr at 03/12/16 1957    . acetaminophen (TYLENOL) tablet 650 mg  650 mg Oral Q6H PRN Lytle Butte, MD       Or  . acetaminophen (TYLENOL) suppository 650 mg  650 mg Rectal Q6H PRN Lytle Butte, MD      . cefTRIAXone (ROCEPHIN) 2 g in dextrose 5 % 50 mL IVPB  2 g Intravenous Q24H Bettey Costa, MD   2 g at 03/13/16 1201  . enoxaparin (LOVENOX) injection 30 mg  30 mg Subcutaneous Q24H Sital Mody, MD      . feeding supplement (ENSURE ENLIVE) (ENSURE ENLIVE) liquid 237 mL  237 mL Oral BID BM Sital Mody, MD      . mirtazapine (REMERON) tablet 15 mg  15 mg Oral QHS Cammie Sickle, MD      . morphine 2 MG/ML injection 2 mg  2 mg Intravenous Q4H PRN Lytle Butte, MD      . ondansetron [facility]) injection 4 mg  4 mg Intravenous Q6H PRN Bettey Costa, MD   4 mg at 03/13/16 0840  . oxyCODONE (Oxy IR/ROXICODONE) immediate release tablet 5 mg  5 mg Oral Q4H PRN Lytle Butte, MD      . potassium chloride SA (K-DUR,KLOR-CON) CR tablet 40 mEq  40 mEq Oral Daily Bettey Costa, MD   40 mEq at 03/13/16 1145  . sodium chloride flush (NS) 0.9 % injection 3 mL  3 mL Intravenous Q12H Lytle Butte, MD   3 mL at 03/13/16 0031    Vital Signs: BP 119/70 mmHg  Pulse 59  Temp(Src) 97.5 F (36.4 C) (Oral)  Resp 20  Wt 47.356 kg (104 lb 6.4 oz)  SpO2 100% Filed Weights   03/12/16 2116  Weight: 47.356 kg (104 lb 6.4 oz)    Estimated body mass index is 18.5 kg/(m^2) as calculated from the following:   Height as of 02/14/16: 5' 3"  (1.6 m).   Weight as of this encounter: 47.356 kg (104 lb 6.4 oz).  PERFORMANCE STATUS (ECOG) : 3 - Symptomatic, >50% confined to bed  PHYSICAL EXAM:        LABS: CBC:    Component Value Date/Time   WBC 14.7* 03/13/2016 0525   HGB 8.1* 03/13/2016 0525   HCT 24.7*  03/13/2016 0525   PLT 123* 03/13/2016 0525   MCV 86.4 03/13/2016 0525   NEUTROABS 7.9* 03/04/2016 1055  LYMPHSABS 0.4* 03/04/2016 1055   MONOABS 0.4 03/04/2016 1055   EOSABS 0.0 03/04/2016 1055   BASOSABS 0.0 03/04/2016 1055   Comprehensive Metabolic Panel:    Component Value Date/Time   NA 140 03/13/2016 0525   NA 138 12/20/2015 0933   K 3.0* 03/13/2016 0525   CL 110 03/13/2016 0525   CO2 26 03/13/2016 0525   BUN 42* 03/13/2016 0525   BUN 9 12/20/2015 0933   CREATININE 1.53* 03/13/2016 0525   GLUCOSE 96 03/13/2016 0525   GLUCOSE 101* 12/20/2015 0933   CALCIUM 9.7 03/13/2016 0525   AST 12* 03/12/2016 1250   ALT 8* 03/12/2016 1250   ALKPHOS 79 03/12/2016 1250   BILITOT 0.8 03/12/2016 1250   PROT 5.7* 03/12/2016 1250   ALBUMIN 2.3* 03/12/2016 1250     Restaging PET scan 5/517: 1. Regression of cervical mass, local adenopathy and metastatic hepatic disease. 2. Small bilateral pulmonary nodules appears stable. Continue observation recommend. 3. Hypermetabolic focus in the left upper lobe is likely artifactual as there is no CT correlate.  PET scan 12/21/15: 1. Since 10/24/2015 diagnostic CT, disease progression. 2. New hepatic metastasis. 3. Progression of locally advanced cervical carcinoma, as evidenced by enlargement of an amorphous hypermetabolic pelvic soft tissue mass. 4. New or progressive left obturator nodal metastasis. Small abdominal retroperitoneal node is mildly hypermetabolic and indeterminate. 5. Placement of bilateral nephrostomy catheters, without hydronephrosis. 6. New small right pleural effusion.  10/24/15 Renal Stone CT  1. Chronic relatively severe right-sided hydronephrosis, with diffuse distention of the right ureter along most of its course. Distention resolves several centimeters above the right vesicoureteral junction. This may reflect a focal stricture. Underlying mass cannot be excluded, especially given hematuria. Ureteroscopy could  be considered for further evaluation, as deemed clinically appropriate. 2. Minimal diverticulosis along the descending colon, without evidence of diverticulitis. 3. Scattered uterine fibroids seen. 3.6 cm hypodense focus at the uterine fundus may reflect a degenerating fibroid, though pelvic ultrasound could be considered for further evaluation to exclude a more worrying mass, as deemed clinically appropriate. 4. Degenerative change at the right hip.  More than 50% of the visit was spent in counseling/coordination of care: Yes  Time Spent: 80 minutes

## 2016-03-13 NOTE — Care Management Note (Signed)
Case Management Note  Patient Details  Name: Tonya Moreno MRN: [id number] Date of Birth: 04/17/1934  Subjective/Objective:       Dr Megan Salon is rounding and is aware of the Palliative Consult for Mrs Tonya Moreno.              Action/Plan:   Expected Discharge Date:                  Expected Discharge Plan:     In-House Referral:     Discharge planning Services     Post Acute Care Choice:    Choice offered to:     DME Arranged:    DME Agency:     HH Arranged:    [facility] Agency:     Status of Service:     Medicare Important Message Given:    Date Medicare IM Given:    Medicare IM give by:    Date Additional Medicare IM Given:    Additional Medicare Important Message give by:     If discussed at [place] of Stay Meetings, dates discussed:    Additional Comments:  Tonya Moreno A, RN 03/13/2016, 12:06 PM

## 2016-03-13 NOTE — Progress Notes (Signed)
Initial Nutrition Assessment  DOCUMENTATION CODES:   Severe malnutrition in context of chronic illness  INTERVENTION:  -Monitor intake, recommend regular diet. Family reports pt does not need chopped meats -Recommend magic cup BID and Ensure Enlive po BID, each supplement provides 350 kcal and 20 grams of protein -Magic cup came during visit and family wanted this writer to offer to pt.  Offered pt magic cup bite with much encouragement but pt refused.  When touched spoon to lips shook head no and pressed lips together.      NUTRITION DIAGNOSIS:   Malnutrition related to chronic illness as evidenced by percent weight loss, severe depletion of muscle mass, energy intake < or equal to 75% for > or equal to 1 month.    GOAL:   Patient will meet greater than or equal to 90% of their needs    MONITOR:   PO intake, Supplement acceptance  REASON FOR ASSESSMENT:   Malnutrition Screening Tool    ASSESSMENT:   82 y/o female admitted with syncopal episode after chemotherapy.   Past Medical History  Diagnosis Date  . Hypertension   . Shortness of breath dyspnea     doe  . Arthritis   . Complication of anesthesia     tonsillectomy age 65 bad experience with mask and hard to wake up  . Enlarged kidney   . Hernia of abdominal wall 12/04/2015    left side  . Acute kidney failure ([place]) 12/11/2015  . Carcinoma of cervix, stage 4 ([place]) 12/31/2015  . Abnormal weight loss   . Decreased sense of taste     Pt sitting in chair with eyes closed.  Family at bedside (dtr, son, dtr in [employer], grandson).  Pt answers very few questions during visit.  Family reports intake has been decreased for the past month especially but since March of this year.  Mostly drinking sprite and pepsi just up until admission.  Was drinking oral nutrition supplements but has stopped recently.    Labs reviewed:K 3.0, BUN 42, creatinine 1.53 Medications reviewed:   Nutrition-Focused physical exam completed. Findings  are mild/moderate fat depletion, mild/moderate to severe muscle depletion, and mild edema.     Diet Order:  Diet Heart Room service appropriate?: Yes; Fluid consistency:: Thin  Skin:  Reviewed, no issues  Last BM:  5/18  Height:   Ht Readings from Last 1 Encounters:  02/14/16 5\' 3"  (1.6 m)    Weight:  18% wt loss in the last 2 months  Wt Readings from Last 1 Encounters:  03/12/16 104 lb 6.4 oz (47.356 kg)   Wt Readings from Last 10 Encounters:  03/12/16 104 lb 6.4 oz (47.356 kg)  03/06/16 113 lb 7 oz (51.455 kg)  02/29/16 107 lb (48.535 kg)  02/25/16 108 lb 12.8 oz (49.35 kg)  02/14/16 116 lb (52.617 kg)  02/13/16 116 lb 3.2 oz (52.708 kg)  02/12/16 117 lb 8 oz (53.298 kg)  02/04/16 120 lb 5.9 oz (54.6 kg)  01/28/16 124 lb 9 oz (56.5 kg)  01/21/16 128 lb 3 oz (58.145 kg)    Ideal Body Weight:     BMI:  Body mass index is 18.5 kg/(m^2).  Estimated Nutritional Needs:   Kcal:  [id number] kcals/d  Protein:  70-83 g/d  Fluid:  1410-1620ml/d  EDUCATION NEEDS:   Education needs addressed  Jacky Hartung B. Zenia Resides, [place], [place] (pager) Weekend/On-Call pager [phone])

## 2016-03-13 NOTE — Progress Notes (Signed)
Patient experiencing nausea and vomiting, no prn zofran orders. Dr. Benjie Karvonen paged and asked for nausea medication for patients relief. MD placed orders.

## 2016-03-13 NOTE — Progress Notes (Signed)
Although patients son is with her, she is not well physically or emotionally. With the wear and tear on the body, the patient seems very tired and uninvolved. Chaplain will continue rounds and make sure to look in on patient as much as possible. Nurse Maddie discussed with me that the Doctor had referenced or was moving towards a DNR. Family and patient don't seem ready to entertain that thought. Emotional and spiritual support are of optimum importance and Chaplain will look to stay in contact with staff as thing progress, with the possibility of the patients health declining.

## 2016-03-13 NOTE — Progress Notes (Signed)
[place] [place] [place] NAME: Tonya Moreno    MR#:  [id number]  DATE OF BIRTH:  October 15, 1934  SUBJECTIVE:  Patient feeling weak  REVIEW OF SYSTEMS:    Review of Systems  Constitutional: Negative for fever, chills and malaise/fatigue.  HENT: Negative for ear discharge, ear pain, hearing loss, nosebleeds and sore throat.   Eyes: Negative for blurred vision and pain.  Respiratory: Negative for cough, hemoptysis, shortness of breath and wheezing.   Cardiovascular: Negative for chest pain, palpitations and leg swelling.  Gastrointestinal: Negative for nausea, vomiting, abdominal pain, diarrhea and blood in stool.  Genitourinary: Negative for dysuria.  Musculoskeletal: Negative for back pain.  Neurological: Negative for dizziness, tremors, speech change, focal weakness, seizures and headaches.  Endo/Heme/Allergies: Does not bruise/bleed easily.  Psychiatric/Behavioral: Negative for depression, suicidal ideas and hallucinations.    Tolerating Diet: yes      DRUG ALLERGIES:   Allergies  Allergen Reactions  . Hydralazine Palpitations  . Taxol [Paclitaxel] Shortness Of Breath and Other (See Comments)    Reaction:  Facial flushing/dizziness     VITALS:  Blood pressure 119/70, pulse 59, temperature 97.5 F (36.4 C), temperature source Oral, resp. rate 20, weight 47.356 kg (104 lb 6.4 oz), SpO2 100 %.  PHYSICAL EXAMINATION:   Physical Exam  Constitutional: She is oriented to person, place, and time and well-developed, well-nourished, and in no distress. No distress.  HENT:  Head: Normocephalic.  Eyes: No scleral icterus.  Neck: Normal range of motion. Neck supple. No JVD present. No tracheal deviation present.  Cardiovascular: Normal rate, regular rhythm and normal heart sounds.  Exam reveals no gallop and no friction rub.   No murmur heard. Pulmonary/Chest: Effort normal and breath sounds normal. No respiratory distress. She has no  wheezes. She has no rales. She exhibits no tenderness.  Abdominal: Soft. Bowel sounds are normal. She exhibits no distension and no mass. There is no tenderness. There is no rebound and no guarding.  Musculoskeletal: Normal range of motion. She exhibits no edema.  Neurological: She is alert and oriented to person, place, and time.  Skin: Skin is warm. No rash noted. No erythema.  Psychiatric: Affect and judgment normal.      LABORATORY PANEL:   CBC  Recent Labs Lab 03/13/16 0525  WBC 14.7*  HGB 8.1*  HCT 24.7*  PLT 123*   ------------------------------------------------------------------------------------------------------------------  Chemistries   Recent Labs Lab 03/12/16 1250 03/13/16 0525  NA 140 140  K 3.1* 3.0*  CL 107 110  CO2 27 26  GLUCOSE 144* 96  BUN 48* 42*  CREATININE 1.80* 1.53*  CALCIUM 10.0 9.7  MG 2.2  --   AST 12*  --   ALT 8*  --   ALKPHOS 79  --   BILITOT 0.8  --    ------------------------------------------------------------------------------------------------------------------  Cardiac Enzymes No results for input(s): TROPONINI in the last 168 hours. ------------------------------------------------------------------------------------------------------------------  RADIOLOGY:  Dg Chest 2 View  03/12/2016  CLINICAL DATA:  Syncope. EXAM: CHEST  2 VIEW COMPARISON:  September 09, 2014. FINDINGS: The heart size and mediastinal contours are within normal limits. Both lungs are clear. No pneumothorax or pleural effusion is noted. Right internal jugular Port-A-Cath is noted with distal tip in expected position of the SVC. The visualized skeletal structures are unremarkable. IMPRESSION: No active cardiopulmonary disease. Electronically Signed   By: Marijo Conception, M.D.   On: 03/12/2016 16:42   Ct Head Wo Contrast  03/12/2016  CLINICAL DATA:  Syncope. EXAM: CT HEAD WITHOUT CONTRAST TECHNIQUE: Contiguous axial images were obtained from the base of the  skull through the vertex without intravenous contrast. COMPARISON:  CT scan of September 07, 2006. FINDINGS: Bony calvarium appears intact. Mild diffuse cortical atrophy is noted. Mild chronic ischemic white matter disease is noted. No mass effect or midline shift is noted. Ventricular size is within normal limits. There is no evidence of mass lesion, hemorrhage or acute infarction. IMPRESSION: Mild diffuse cortical atrophy. Mild chronic ischemic white matter disease. No acute intracranial abnormality seen. Electronically Signed   By: Marijo Conception, M.D.   On: 03/12/2016 16:33     ASSESSMENT AND PLAN:    82 year old female with a history of metastatic cervical cancer who presents with syncopal episode.   1. Gram-negative rod bacteremia, [place] from UTI: Follow-up on final blood cultures Increased dose of Rocephin.  2. Syncope: This is due to dehydration and poor by mouth intake as well as infection.   3. Urinary tract infection: Continue increased dose of ceftriaxone due to problem #1. Follow-up on urine culture.   4. Adult failure to thrive and cervical cancer stage IV invading bladder and rectum with metastatic disease to liver: Follow-up on oncology and positive care consult  5. Acute kidney injury: Improved with IV fluids. This is due to prerenal azotemia from poor by mouth intake.   Management plans discussed with the patient and she is in agreement.  CODE STATUS: FULL  TOTAL TIME TAKING CARE OF THIS PATIENT: 31 minutes.     POSSIBLE D/C 2 days, DEPENDING ON CLINICAL CONDITION.   Jenay Morici M.D on 03/13/2016 at 12:23 PM  Between 7am to 6pm - Pager - [phone] After 6pm go to [email or web address] - password [email or web address] [facility]  Office  [phone]  CC: Primary care physician; Letta Median, MD  Note: This dictation was prepared with Dragon dictation along with smaller phrase technology. Any transcriptional errors that result from this process  are unintentional.

## 2016-03-14 ENCOUNTER — Inpatient Hospital Stay: Payer: Medicare Other

## 2016-03-14 DIAGNOSIS — E46 Unspecified protein-calorie malnutrition: Secondary | ICD-10-CM | POA: Diagnosis not present

## 2016-03-14 DIAGNOSIS — B9689 Other specified bacterial agents as the cause of diseases classified elsewhere: Secondary | ICD-10-CM | POA: Diagnosis present

## 2016-03-14 DIAGNOSIS — K5730 Diverticulosis of large intestine without perforation or abscess without bleeding: Secondary | ICD-10-CM | POA: Diagnosis present

## 2016-03-14 DIAGNOSIS — C787 Secondary malignant neoplasm of liver and intrahepatic bile duct: Secondary | ICD-10-CM | POA: Diagnosis present

## 2016-03-14 DIAGNOSIS — C785 Secondary malignant neoplasm of large intestine and rectum: Secondary | ICD-10-CM | POA: Diagnosis present

## 2016-03-14 DIAGNOSIS — E860 Dehydration: Secondary | ICD-10-CM | POA: Diagnosis present

## 2016-03-14 DIAGNOSIS — C7911 Secondary malignant neoplasm of bladder: Secondary | ICD-10-CM | POA: Diagnosis present

## 2016-03-14 DIAGNOSIS — Z888 Allergy status to other drugs, medicaments and biological substances status: Secondary | ICD-10-CM | POA: Diagnosis not present

## 2016-03-14 DIAGNOSIS — N390 Urinary tract infection, site not specified: Secondary | ICD-10-CM | POA: Diagnosis present

## 2016-03-14 DIAGNOSIS — R7881 Bacteremia: Secondary | ICD-10-CM | POA: Diagnosis present

## 2016-03-14 DIAGNOSIS — I129 Hypertensive chronic kidney disease with stage 1 through stage 4 chronic kidney disease, or unspecified chronic kidney disease: Secondary | ICD-10-CM | POA: Diagnosis present

## 2016-03-14 DIAGNOSIS — D638 Anemia in other chronic diseases classified elsewhere: Secondary | ICD-10-CM | POA: Diagnosis present

## 2016-03-14 DIAGNOSIS — E43 Unspecified severe protein-calorie malnutrition: Secondary | ICD-10-CM | POA: Diagnosis present

## 2016-03-14 DIAGNOSIS — B961 Klebsiella pneumoniae [K. pneumoniae] as the cause of diseases classified elsewhere: Secondary | ICD-10-CM | POA: Diagnosis present

## 2016-03-14 DIAGNOSIS — N183 Chronic kidney disease, stage 3 (moderate): Secondary | ICD-10-CM | POA: Diagnosis present

## 2016-03-14 DIAGNOSIS — C539 Malignant neoplasm of cervix uteri, unspecified: Secondary | ICD-10-CM | POA: Diagnosis present

## 2016-03-14 DIAGNOSIS — J150 Pneumonia due to Klebsiella pneumoniae: Secondary | ICD-10-CM | POA: Diagnosis not present

## 2016-03-14 DIAGNOSIS — C799 Secondary malignant neoplasm of unspecified site: Secondary | ICD-10-CM | POA: Diagnosis not present

## 2016-03-14 DIAGNOSIS — Z515 Encounter for palliative care: Secondary | ICD-10-CM | POA: Diagnosis present

## 2016-03-14 DIAGNOSIS — N179 Acute kidney failure, unspecified: Secondary | ICD-10-CM | POA: Diagnosis not present

## 2016-03-14 DIAGNOSIS — Z681 Body mass index (BMI) 19 or less, adult: Secondary | ICD-10-CM | POA: Diagnosis not present

## 2016-03-14 DIAGNOSIS — I951 Orthostatic hypotension: Secondary | ICD-10-CM | POA: Diagnosis present

## 2016-03-14 DIAGNOSIS — R627 Adult failure to thrive: Secondary | ICD-10-CM | POA: Diagnosis present

## 2016-03-14 LAB — BASIC METABOLIC PANEL
ANION GAP: 2 — AB (ref 5–15)
BUN: 36 mg/dL — ABNORMAL HIGH (ref 6–20)
CALCIUM: 9.8 mg/dL (ref 8.9–10.3)
CHLORIDE: 117 mmol/L — AB (ref 101–111)
CO2: 26 mmol/L (ref 22–32)
Creatinine, Ser: 1.51 mg/dL — ABNORMAL HIGH (ref 0.44–1.00)
GFR calc Af Amer: 37 mL/min — ABNORMAL LOW (ref >60)
GFR calc non Af Amer: 32 mL/min — ABNORMAL LOW (ref >60)
GLUCOSE: 86 mg/dL (ref 65–99)
Potassium: 3.3 mmol/L — ABNORMAL LOW (ref 3.5–5.1)
Sodium: 145 mmol/L (ref 135–145)

## 2016-03-14 LAB — CBC
HEMATOCRIT: 25 % — AB (ref 35.0–47.0)
HEMOGLOBIN: 8.3 g/dL — AB (ref 12.0–16.0)
MCH: 28.7 pg (ref 26.0–34.0)
MCHC: 33.1 g/dL (ref 32.0–36.0)
MCV: 86.6 fL (ref 80.0–100.0)
PLATELETS: 129 10*3/uL — AB (ref 150–440)
RBC: 2.88 MIL/uL — AB (ref 3.80–5.20)
RDW: 19.7 % — ABNORMAL HIGH (ref 11.5–14.5)
WBC: 8.2 10*3/uL (ref 3.6–11.0)

## 2016-03-14 MED ORDER — GLYCOPYRROLATE 1 MG PO TABS: 1.0000 mg | ORAL_TABLET | Freq: Three times a day (TID) | ORAL | Status: DC | PRN

## 2016-03-14 MED ORDER — BISACODYL 10 MG RE SUPP: 10.0000 mg | RECTAL | Status: AC | PRN

## 2016-03-14 MED ORDER — MORPHINE SULFATE (CONCENTRATE) 10 MG/0.5ML PO SOLN: 5.0000 mg | ORAL | Status: DC | PRN

## 2016-03-14 MED ORDER — LORAZEPAM 0.5 MG PO TABS: 0.5000 mg | ORAL_TABLET | ORAL | Status: DC | PRN

## 2016-03-14 MED ORDER — BISACODYL 10 MG RE SUPP: 10.0000 mg | RECTAL | Status: DC | PRN

## 2016-03-14 MED ORDER — LORAZEPAM 2 MG/ML IJ SOLN: 0.5000 mg | INTRAMUSCULAR | Status: DC | PRN

## 2016-03-14 MED ORDER — MORPHINE SULFATE (CONCENTRATE) 10 MG/0.5ML PO SOLN: 5.0000 mg | ORAL | Status: AC | PRN

## 2016-03-14 MED ORDER — PROCHLORPERAZINE 25 MG RE SUPP: 25.0000 mg | Freq: Three times a day (TID) | RECTAL | Status: AC | PRN

## 2016-03-14 MED ORDER — PROCHLORPERAZINE 25 MG RE SUPP
25.0000 mg | Freq: Three times a day (TID) | RECTAL | Status: DC | PRN
  Filled 2016-03-14: qty 1

## 2016-03-14 MED ORDER — LORAZEPAM 0.5 MG PO TABS: 0.5000 mg | ORAL_TABLET | ORAL | Status: AC | PRN

## 2016-03-14 MED ORDER — GLYCOPYRROLATE 1 MG PO TABS: 1.0000 mg | ORAL_TABLET | Freq: Three times a day (TID) | ORAL | Status: AC | PRN

## 2016-03-14 MED ORDER — ONDANSETRON HCL 4 MG/2ML IJ SOLN
4.0000 mg | Freq: Once | INTRAMUSCULAR | Status: AC
  Administered 2016-03-14: 4 mg via INTRAVENOUS
  Filled 2016-03-14: qty 2

## 2016-03-14 NOTE — Care Management (Signed)
Chart reviewed. RNCM will follow up after Dr. Megan Salon and family decide on discharge disposition and assist as needed.

## 2016-03-14 NOTE — Progress Notes (Signed)
Nutrition Brief Note:  Dr Megan Salon ordered calorie count late yesterday afternoon.  Calorie count instructions added this am and talked to [employer] regarding completing calorie count.  RD to calculate kcals and protein once tray tickets collected.    Kaylib Furness B. Zenia Resides, [place], [place] (pager) Weekend/On-Call pager [phone])

## 2016-03-14 NOTE — Progress Notes (Signed)
New Hospice home referral received from [place] following a Palliative care consult with Dr. Megan Salon. Tonya Moreno is a 82 year old woman with a known history of stage 4 cervical cancer s/p chemo and radiation. She was admitted to [facility] on 5/17 following a syncopal episode after receiving her chemotherapy. She has continued with poor oral intake and increased weakness despite medical treatment for a urinary tract infection. Patient and her family have met with Dr. Megan Salon and oncologist Dr. Yevette Edwards and have decided to forgo further treatment and focus on her comfort at the hospice home. Writer met in the patient's room with her daughter Tonya Moreno and 2 family friends. Tonya Moreno appeared very weak, she dozed off and on through out the visit. She denied pain or discomfort. Family reports she the only oral intake she has had for several days is "sips of Pepsi". She has lost 12 lbs in 3 weeks. She has required no PRN pain medication, she has had one dose of PRN zofran on 5/18 for nausea.  Writer initiated education regarding hospice services, philosophy and team approach to care with good understanding voiced. Questions answered, consents signed. Patient information faxed to referral. Hospital care team and family are aware of and in agreement with plan for discharge today to the hospice home with signed portable DNR in place. Report called to the hospice home, EMS notified for transport. Thank you for the opportunity to be involved in the care of this patient and her family. Flo Shanks RN, BSN, [facility] [place], hospital Liaison [phone] c

## 2016-03-14 NOTE — Progress Notes (Signed)
[place] at [place] NAME: Tonya Moreno    MR#:  [id number]  DATE OF BIRTH:  09-Jun-1934  SUBJECTIVE:  Feels weak. Poor appetite.  REVIEW OF SYSTEMS:    Review of Systems  Constitutional: Negative for fever, chills and malaise/fatigue.  HENT: Negative for ear discharge, ear pain, hearing loss, nosebleeds and sore throat.   Eyes: Negative for blurred vision and pain.  Respiratory: Negative for cough, hemoptysis, shortness of breath and wheezing.   Cardiovascular: Negative for chest pain, palpitations and leg swelling.  Gastrointestinal: Negative for nausea, vomiting, abdominal pain, diarrhea and blood in stool.  Genitourinary: Negative for dysuria.  Musculoskeletal: Negative for back pain.  Neurological: Negative for dizziness, tremors, speech change, focal weakness, seizures and headaches.  Endo/Heme/Allergies: Does not bruise/bleed easily.  Psychiatric/Behavioral: Negative for depression, suicidal ideas and hallucinations.   Tolerating Diet: yes  DRUG ALLERGIES:   Allergies  Allergen Reactions  . Hydralazine Palpitations  . Taxol [Paclitaxel] Shortness Of Breath and Other (See Comments)    Reaction:  Facial flushing/dizziness     VITALS:  Blood pressure 137/67, pulse 59, temperature 98.8 F (37.1 C), temperature source Oral, resp. rate 16, weight 47.356 kg (104 lb 6.4 oz), SpO2 100 %.  PHYSICAL EXAMINATION:   Physical Exam  Constitutional: She is oriented to person, place, and time and well-developed, well-nourished, and in no distress. No distress.  HENT:  Head: Normocephalic.  Eyes: No scleral icterus.  Neck: Normal range of motion. Neck supple. No JVD present. No tracheal deviation present.  Cardiovascular: Normal rate, regular rhythm and normal heart sounds.  Exam reveals no gallop and no friction rub.   No murmur heard. Pulmonary/Chest: Effort normal and breath sounds normal. No respiratory distress. She has no  wheezes. She has no rales. She exhibits no tenderness.  Abdominal: Soft. Bowel sounds are normal. She exhibits no distension and no mass. There is no tenderness. There is no rebound and no guarding.  Musculoskeletal: Normal range of motion. She exhibits no edema.  Neurological: She is alert and oriented to person, place, and time.  Skin: Skin is warm. No rash noted. No erythema.  Psychiatric: Affect and judgment normal.   LABORATORY PANEL:   CBC  Recent Labs Lab 03/14/16 0454  WBC 8.2  HGB 8.3*  HCT 25.0*  PLT 129*   ------------------------------------------------------------------------------------------------------------------  Chemistries   Recent Labs Lab 03/12/16 1250  03/14/16 0454  NA 140  < > 145  K 3.1*  < > 3.3*  CL 107  < > 117*  CO2 27  < > 26  GLUCOSE 144*  < > 86  BUN 48*  < > 36*  CREATININE 1.80*  < > 1.51*  CALCIUM 10.0  < > 9.8  MG 2.2  --   --   AST 12*  --   --   ALT 8*  --   --   ALKPHOS 79  --   --   BILITOT 0.8  --   --   < > = values in this interval not displayed. ------------------------------------------------------------------------------------------------------------------  Cardiac Enzymes No results for input(s): TROPONINI in the last 168 hours. ------------------------------------------------------------------------------------------------------------------  RADIOLOGY:  Dg Chest 2 View  03/12/2016  CLINICAL DATA:  Syncope. EXAM: CHEST  2 VIEW COMPARISON:  September 09, 2014. FINDINGS: The heart size and mediastinal contours are within normal limits. Both lungs are clear. No pneumothorax or pleural effusion is noted. Right internal jugular Port-A-Cath is noted with distal tip in expected  position of the SVC. The visualized skeletal structures are unremarkable. IMPRESSION: No active cardiopulmonary disease. Electronically Signed   By: Marijo Conception, M.D.   On: 03/12/2016 16:42   Ct Head Wo Contrast  03/12/2016  CLINICAL DATA:   Syncope. EXAM: CT HEAD WITHOUT CONTRAST TECHNIQUE: Contiguous axial images were obtained from the base of the skull through the vertex without intravenous contrast. COMPARISON:  CT scan of September 07, 2006. FINDINGS: Bony calvarium appears intact. Mild diffuse cortical atrophy is noted. Mild chronic ischemic white matter disease is noted. No mass effect or midline shift is noted. Ventricular size is within normal limits. There is no evidence of mass lesion, hemorrhage or acute infarction. IMPRESSION: Mild diffuse cortical atrophy. Mild chronic ischemic white matter disease. No acute intracranial abnormality seen. Electronically Signed   By: Marijo Conception, M.D.   On: 03/12/2016 16:33   ASSESSMENT AND PLAN:   82 year old female with a history of metastatic cervical cancer who presents with syncopal episode.  1. Gram-negative rod bacteremia, Likely from UTI Urine culture was not sent on admission. We'll add this to previously collected sample. On ceftriaxone. WBC improving. We'll need to wait for final ID and sensitivities.  2. Syncope:  This is due to dehydration and poor by mouth intake as well as infection.   3. Adult failure to thrive and cervical cancer stage IV invading bladder and rectum with metastatic disease to liver:  oncology and palliative care on board. Appreciate help.  4. Protein calorie malnutrition Calorie count underway.  5. DVT prophylaxis with Lovenox  6. CKD 3 Stable  7. Anemia of chronic disease is stable  Management plans discussed with the patient and she is in agreement.  CODE STATUS: FULL  TOTAL TIME TAKING CARE OF THIS PATIENT: 35 minutes.   POSSIBLE D/C 1-2 days, DEPENDING ON CLINICAL CONDITION.  Hillary Bow R M.D on 03/14/2016 at 11:40 AM  Between 7am to 6pm - Pager - [phone]  After 6pm go to [email or web address] - password [email or web address] [facility]  Office  [phone]  CC: Primary care physician; Letta Median,  MD  Note: This dictation was prepared with Dragon dictation along with smaller phrase technology. Any transcriptional errors that result from this process are unintentional.

## 2016-03-14 NOTE — Progress Notes (Signed)
Nutrition Brief Note  Chart reviewed. Pt now transitioning to comfort care. Dr Megan Salon following and noted DNR and planning transfer to hospice home.  Spoke with Dr. Megan Salon and calorie count has been discontinued.  No further nutrition interventions warranted at this time.  Please re-consult as needed.   Tonya Moreno B. Zenia Resides, [place], [place] (pager) Weekend/On-Call pager [phone])

## 2016-03-14 NOTE — Progress Notes (Signed)
[place]   DOB:23-Mar-1934   EX:[id number]    Subjective: patient does not talk much/flat affect. Poor appetite. Poor by mouth intake. Positive for nausea no vomiting.  ROS; limited as patient does not give much history.  Objective:  Filed Vitals:   03/14/16 0425 03/14/16 1106  BP: 147/68 137/67  Pulse: 57 59  Temp: 98.4 F (36.9 C) 98.8 F (37.1 C)  Resp: 17 16     Intake/Output Summary (Last 24 hours) at 03/14/16 1702 Last data filed at 03/14/16 1130  Gross per 24 hour  Intake 1171.67 ml  Output    800 ml  Net 371.67 ml    GENERAL: Cachectic appearing female patient resting in the chair. Alert, no distress and comfortable. Patient appears tired. She is accompanied by multiple family members.  EYES: no pallor or icterus OROPHARYNX: no thrush or ulceration; poor dentition  NECK: supple, no masses felt LYMPH: no palpable lymphadenopathy in the cervical, axillary or inguinal regions LUNGS:decreased breath sounds bilaterally at the bases. and No wheeze or crackles HEART/CVS: regular rate & rhythm and no murmurs; No lower extremity edema ABDOMEN:abdomen soft, non-tender and normal bowel sounds Musculoskeletal:no cyanosis of digits and no clubbing  PSYCH: alert & oriented x 3 with fluent speech NEURO: no focal motor/sensory deficits SKIN: no rashes or significant lesions; bil nephrostomy tubes.    Labs:  Lab Results  Component Value Date   WBC 8.2 03/14/2016   HGB 8.3* 03/14/2016   HCT 25.0* 03/14/2016   MCV 86.6 03/14/2016   PLT 129* 03/14/2016   NEUTROABS 7.9* 03/04/2016    Lab Results  Component Value Date   NA 145 03/14/2016   K 3.3* 03/14/2016   CL 117* 03/14/2016   CO2 26 03/14/2016    Studies:  No results found.  Assessment & Plan:   # 82 year old female patient with history of metastatic cervical cancer status post palliative chemoradiation is currently admitted to hospital for syncope/blood cultures positive for Klebsiella pneumonia  infection.  # Metastatic cervical cancer- status post palliative carboplatin- Abraxane along with radiation. Finished April 2018. Restaging PET scan showed improvement response. However significant decline in performance status noted patient is not a candidate for any further chemotherapy at this time. This was discussed at length with the patient/daughter in detail.  #  Malnutrition/failure to thrive- likely from underlying malignancy/depression.   # Klebsiella pneumonia/bacteremia- question UTI. Status post Rocephin.  # agree with palliative care evaluation/ hospice home. This was discussed extensively with the patient's daughter. They agree.   Cammie Sickle, MD 03/14/2016  5:02 PM

## 2016-03-14 NOTE — Progress Notes (Signed)
Palliative Medicine Inpatient Consult Follow Up Note   Name: Tonya Moreno Date: 03/14/2016 MRN: [id number]  DOB: 1934/04/22  Referring Physician: Hillary Bow, MD  Palliative Care consult requested for this 82 y.o. female for goals of medical therapy in patient with stage 4 cervical cancer and no substantive oral intake for some time now.   TODAY'S DISCUSSIONS AND PLAN: 1.  I called and spoke with Dr. Tish Men before meeting with pt/ family today.  Pt is normally followed by Dr. Oliva Bustard, but as he is retiring and not available to be reached today. Dr. Rogue Bussing has seen pt this week and he stated that as of today, he will officially be patient's oncologist --since I need an official oncology opinion at this time regarding whether or not pt might be a candidate for further chemo and /or radiation.  Patient essentially eats nothing.  He stated she does not appear to be a candidate for further treatment.  He will come by and talk with the family today.  I talked with pt with family in the room. Those present today in the room consisted of daughter, April, son, Renelda Loma, and daughter-in-law, and others.  Not present was son, Hoover Browns (his wife was here for him and  I left him a voice mail message at his wife's request).  Also not present was son, Linna Hoff.  I met Micheal last night but have not met Dan as yet at all.  Pt today agreed with DNR and with Hospice Home (as family had inquired about this last night --stating they would have great difficulty having her at home and also they would prefer the [facility] in [place] --if she would agree to go there ---I had presented CHOICE by mentioning various hospice agencies and also various hospice homes including the one in [facility], where one son lives ---but they were sure they would want the [facility] in [place]).   After I left, the pt said she wasn't going when one family member told her she was 'going to [facility]'.  I was summoned back.  I again presented this to pt and she indicated she understood.  She asked what the family members thought.  One by one, they told her they thought this would be best and they told her various reasons why it would be best (nurses round the clock, easy place for all to visit and stay, etc).  Pt then agreed after family told her their reasons. She then went back to sleep.  I mentioned that we need not bring this up repeatedly.  Pt does at times have some confusion, but she did understand what she was consenting to at this time.   I am satisfied with her agreement to be DNR and to go to [facility].  I feel she is a very appropriate patient for both of these decisions.    I have notified attending and social worker.    I will change meds to comfort meds (family aware).  I will work on DC meds.  ---------------------------------------------------------------------------------- See yesterday's consult note for details of patient's clinical narrative. -----------------------------------------------------------------------------------  REVIEW OF SYSTEMS:  She denies pain or shortness of breath BUT SHE SAYS SHE IS ALWAYS NAUSEATED  CODE STATUS: DNR as of now   PAST MEDICAL HISTORY: Past Medical History  Diagnosis Date  . Hypertension   . Shortness of breath dyspnea     doe  . Arthritis   . Complication of anesthesia     tonsillectomy age 31 bad  experience with mask and hard to wake up  . Enlarged kidney   . Hernia of abdominal wall 12/04/2015    left side  . Acute kidney failure ([place]) 12/11/2015  . Carcinoma of cervix, stage 4 ([place]) 12/31/2015  . Abnormal weight loss   . Decreased sense of taste     PAST SURGICAL HISTORY:  Past Surgical History  Procedure Laterality Date  . Tonsillectomy    . Cystoscopy w/ retrogrades Left 12/10/2015    Procedure: CYSTOSCOPY WITH RETROGRADE PYELOGRAM;  Surgeon: Hollice Espy, MD;  Location: [facility] ORS;  Service: Urology;  Laterality: Left;  .  Transurethral resection of bladder tumor N/A 12/10/2015    Procedure: TRANSURETHRAL RESECTION OF BLADDER TUMOR (TURBT);  Surgeon: Hollice Espy, MD;  Location: [facility] ORS;  Service: Urology;  Laterality: N/A;  . Peripheral vascular catheterization N/A 01/03/2016    Procedure: Porta Cath Insertion;  Surgeon: Algernon Huxley, MD;  Location: [facility];  Service: Cardiovascular;  Laterality: N/A;    Vital Signs: BP 137/67 mmHg  Pulse 59  Temp(Src) 98.8 F (37.1 C) (Oral)  Resp 16  Wt 47.356 kg (104 lb 6.4 oz)  SpO2 100% Filed Weights   03/12/16 2116  Weight: 47.356 kg (104 lb 6.4 oz)    Estimated body mass index is 18.5 kg/(m^2) as calculated from the following:   Height as of 02/14/16: 5' 3"  (1.6 m).   Weight as of this encounter: 47.356 kg (104 lb 6.4 oz).  PHYSICAL EXAM: Pt is up in bedside chair.  She opens her eyes and responds in short sentences appropriately --then she drifts off and is quickly asleep again No JVD or TM Hrt rrr no m Lungs cta ant Abd soft and NT Ext no mottling or cyanosis Lethargic but wakens and makes sense  LABS: CBC:    Component Value Date/Time   WBC 8.2 03/14/2016 0454   HGB 8.3* 03/14/2016 0454   HCT 25.0* 03/14/2016 0454   PLT 129* 03/14/2016 0454   MCV 86.6 03/14/2016 0454   NEUTROABS 7.9* 03/04/2016 1055   LYMPHSABS 0.4* 03/04/2016 1055   MONOABS 0.4 03/04/2016 1055   EOSABS 0.0 03/04/2016 1055   BASOSABS 0.0 03/04/2016 1055   Comprehensive Metabolic Panel:    Component Value Date/Time   NA 145 03/14/2016 0454   NA 138 12/20/2015 0933   K 3.3* 03/14/2016 0454   CL 117* 03/14/2016 0454   CO2 26 03/14/2016 0454   BUN 36* 03/14/2016 0454   BUN 9 12/20/2015 0933   CREATININE 1.51* 03/14/2016 0454   GLUCOSE 86 03/14/2016 0454   GLUCOSE 101* 12/20/2015 0933   CALCIUM 9.8 03/14/2016 0454   AST 12* 03/12/2016 1250   ALT 8* 03/12/2016 1250   ALKPHOS 79 03/12/2016 1250   BILITOT 0.8 03/12/2016 1250   PROT 5.7* 03/12/2016 1250    ALBUMIN 2.3* 03/12/2016 1250    More than 50% of the visit was spent in counseling/coordination of care: YES  Time Spent:  65 min

## 2016-03-14 NOTE — Progress Notes (Signed)
Patient to be d/c'd to hospice, bathed and ready for transport. Family is present at beside. Per Santiago Glad (hospice nurse) EMS has been called. Family and patient does not request anything at this time. Wilnette Kales

## 2016-03-14 NOTE — Progress Notes (Signed)
Physical Therapy Treatment Patient Details Name: ANURADHA WIGGLESWORTH MRN: [id number] DOB: 21-May-1934 Today's Date: 03/14/2016    History of Present Illness Pt admitted for syncope after having chemotherapy. Pt with history of cervical cancer, HTN and has complaints of nausea today.    PT Comments    Pt alert and awake, agreeable to participate in therapy.  Pt performed supine to sit trf with minA cues for sequencing. Pt was CGA sit to stand and was able to transfer to recliner chair.  Pt then able to tolerate seated therex as noted in chart.  She would con't to benefit from skilled PT to increase functional mobility tolerance and safety.    Follow Up Recommendations  Home health PT     Equipment Recommendations       Recommendations for Other Services       Precautions / Restrictions Precautions Precautions: Fall Restrictions Weight Bearing Restrictions: No    Mobility  Bed Mobility Overal bed mobility: Needs Assistance Bed Mobility: Supine to Sit     Supine to sit: Min assist     General bed mobility comments: min cues for sequencing.   Transfers Overall transfer level: Needs assistance Equipment used: Rolling walker (2 wheeled) Transfers: Sit to/from Stand Sit to Stand: Min guard         General transfer comment: Pt able to trf  with good safety   Ambulation/Gait Ambulation/Gait assistance: Min guard Ambulation Distance (Feet): 3 Feet Assistive device: Rolling walker (2 wheeled) Gait Pattern/deviations: Step-to pattern     General Gait Details: small shuffling steps    Stairs            Wheelchair Mobility    Modified Rankin (Stroke Patients Only)       Balance Overall balance assessment: Needs assistance Sitting-balance support: Bilateral upper extremity supported Sitting balance-Leahy Scale: Good     Standing balance support: Bilateral upper extremity supported Standing balance-Leahy Scale: Fair                       Cognition Arousal/Alertness: Awake/alert Behavior During Therapy: WFL for tasks assessed/performed Overall Cognitive Status: Difficult to assess                      Exercises Other Exercises Other Exercises: Seated LE therex, LAQ, hip flexion, ip abd/add, pillow squeezes, ap x 10 b/l     General Comments        Pertinent Vitals/Pain Pain Assessment: No/denies pain    Home Living                      Prior Function            PT Goals (current goals can now be found in the care plan section) Acute Rehab PT Goals Patient Stated Goal: agreeable for OOB  PT Goal Formulation: With patient Time For Goal Achievement: 03/27/16 Potential to Achieve Goals: Good    Frequency  Min 2X/week    PT Plan      Co-evaluation             End of Session   Activity Tolerance: Patient tolerated treatment well Patient left: in chair;with call bell/phone within reach;with chair alarm set;with family/visitor present     Time: 1150-1210 PT Time Calculation (min) (ACUTE ONLY): 20 min  Charges:  $Therapeutic Exercise: 8-22 mins  G Codes:      Rashel Okeefe April 09, 2016, 1:14 PM Ashden Sonnenberg, PTA

## 2016-03-14 NOTE — Discharge Summary (Signed)
Tonya Moreno NAME: Tonya Moreno    MR#:  [id number]  DATE OF BIRTH:  1934/08/11  DATE OF ADMISSION:  03/12/2016 ADMITTING PHYSICIAN: Lytle Butte, MD  DATE OF DISCHARGE: 03/14/2016   PRIMARY CARE PHYSICIAN: Letta Median, MD   ADMISSION DIAGNOSIS:  UTI (lower urinary tract infection) [N39.0] Failure to thrive in adult [R62.7] Syncope, unspecified syncope type [R55]  DISCHARGE DIAGNOSIS:  Active Problems:   Syncope   Protein-calorie malnutrition, severe   Bacteremia   SECONDARY DIAGNOSIS:   Past Medical History  Diagnosis Date  . Hypertension   . Shortness of breath dyspnea     doe  . Arthritis   . Complication of anesthesia     tonsillectomy age 71 bad experience with mask and hard to wake up  . Enlarged kidney   . Hernia of abdominal wall 12/04/2015    left side  . Acute kidney failure ([place]) 12/11/2015  . Carcinoma of cervix, stage 4 ([place]) 12/31/2015  . Abnormal weight loss   . Decreased sense of taste      ADMITTING HISTORY  Tonya Moreno is a 82 y.o. female with a known history of Cervical cancer on active chemotherapy presenting after chemotherapy infusion with syncopal episode. Patient completed her infusion today without difficulty however upon presentation back to her house she suffered a syncopal episode. She is unable to provide meaningful information at this time given mental status medical condition. History aided by son present at bedside. States she has had extremely poor appetite for some time now losing approximately 30 pounds in the last month. Once again patient unable to provide information given mental status  HOSPITAL COURSE:   1. Gram-negative rod bacteremia, Likely from UTI 2. Syncope:  3. Adult failure to thrive and cervical cancer stage IV invading bladder and rectum with metastatic disease to liver 4. Protein calorie malnutrition 5. DVT prophylaxis with Lovenox 6.  CKD 3 7. Anemia of chronic disease   Patient was aggressively treated with IV antibiotics and IV fluids.  Palliative care was consulted due to patient's worsening cancer. Tonya Moreno with palliative care discussed with patient and family. Due to poor prognosis and worsening cancer patient was thought to be a candidate for hospice home to which she has agreed to. She has a bed available and will be discharged to hospice home as per discussion by Dr. Megan Salon.  CONSULTS OBTAINED:  Treatment Team:  Lytle Butte, MD  DRUG ALLERGIES:   Allergies  Allergen Reactions  . Hydralazine Palpitations  . Taxol [Paclitaxel] Shortness Of Breath and Other (See Comments)    Reaction:  Facial flushing/dizziness     DISCHARGE MEDICATIONS:   Current Discharge Medication List    START taking these medications   Details  bisacodyl (DULCOLAX) 10 MG suppository Place 1 suppository (10 mg total) rectally as needed for moderate constipation. Qty: 6 suppository, Refills: 0    glycopyrrolate (ROBINUL) 1 MG tablet Take 1 tablet (1 mg total) by mouth 3 (three) times daily as needed (secretion management). Qty: 12 tablet    LORazepam (ATIVAN) 0.5 MG tablet Take 1 tablet (0.5 mg total) by mouth every 4 (four) hours as needed for anxiety. Qty: 15 tablet, Refills: 0    Morphine Sulfate (MORPHINE CONCENTRATE) 10 MG/0.5ML SOLN concentrated solution Place 0.25 mLs (5 mg total) under the tongue every hour as needed for moderate pain, severe pain or shortness of breath. Qty: 30 mL, Refills: 0  prochlorperazine (COMPAZINE) 25 MG suppository Place 1 suppository (25 mg total) rectally every 8 (eight) hours as needed for nausea or vomiting. Qty: 6 suppository, Refills: 0      STOP taking these medications     citalopram (CELEXA) 10 MG tablet      predniSONE (DELTASONE) 20 MG tablet         Today   VITAL SIGNS:  Blood pressure 137/67, pulse 59, temperature 98.8 F (37.1 C), temperature source Oral,  resp. rate 16, weight 47.356 kg (104 lb 6.4 oz), SpO2 100 %.  I/O:   Intake/Output Summary (Last 24 hours) at 03/14/16 1609 Last data filed at 03/14/16 1130  Gross per 24 hour  Intake 1171.67 ml  Output    800 ml  Net 371.67 ml    PHYSICAL EXAMINATION:  Physical Exam  GENERAL:  82 y.o.-year-old patient lying in the bed with no acute distress.  LUNGS: Normal breath sounds bilaterally CARDIOVASCULAR: S1, S2 normal. No murmurs, rubs, or gallops.  ABDOMEN: Soft, non-tender, non-distended. Bowel sounds present. No organomegaly or mass.  NEUROLOGIC: Moves all 4 extremities. PSYCHIATRIC: The patient is awake  DATA REVIEW:   CBC  Recent Labs Lab 03/14/16 0454  WBC 8.2  HGB 8.3*  HCT 25.0*  PLT 129*    Chemistries   Recent Labs Lab 03/12/16 1250  03/14/16 0454  NA 140  < > 145  K 3.1*  < > 3.3*  CL 107  < > 117*  CO2 27  < > 26  GLUCOSE 144*  < > 86  BUN 48*  < > 36*  CREATININE 1.80*  < > 1.51*  CALCIUM 10.0  < > 9.8  MG 2.2  --   --   AST 12*  --   --   ALT 8*  --   --   ALKPHOS 79  --   --   BILITOT 0.8  --   --   < > = values in this interval not displayed.  Cardiac Enzymes No results for input(s): TROPONINI in the last 168 hours.  Microbiology Results  Results for orders placed or performed during the hospital encounter of 03/12/16  Blood culture (routine x 2)     Status: Abnormal (Preliminary result)   Collection Time: 03/12/16  7:05 PM  Result Value Ref Range Status   Specimen Description BLOOD LEFT ASSIST CONTROL  Final   Special Requests BOTTLES DRAWN AEROBIC AND ANAEROBIC  10CC  Final   Culture  Setup Time   Final    GRAM NEGATIVE RODS IN BOTH AEROBIC AND ANAEROBIC BOTTLES CRITICAL RESULT CALLED TO, READ BACK BY AND VERIFIED WITH: CHRISTINE KATSOUDAS 03/13/16 1115 MLM Organism ID to follow    Culture (A)  Final    KLEBSIELLA PNEUMONIAE IN BOTH AEROBIC AND ANAEROBIC BOTTLES SUSCEPTIBILITIES TO FOLLOW    Report Status PENDING  Incomplete   Blood culture (routine x 2)     Status: None (Preliminary result)   Collection Time: 03/12/16  7:05 PM  Result Value Ref Range Status   Specimen Description BLOOD LEFT ASSIST CONTROL  Final   Special Requests BOTTLES DRAWN AEROBIC AND ANAEROBIC  10CC  Final   Culture  Setup Time   Final    GRAM NEGATIVE RODS IN BOTH AEROBIC AND ANAEROBIC BOTTLES CRITICAL RESULT CALLED TO, READ BACK BY AND VERIFIED WITH: CHRISTINE KATSOUDAS 03/13/16 1115 MLM    Culture   Final    GRAM NEGATIVE RODS IN BOTH AEROBIC AND ANAEROBIC BOTTLES  Report Status PENDING  Incomplete  Blood Culture ID Panel (Reflexed)     Status: Abnormal   Collection Time: 03/12/16  7:05 PM  Result Value Ref Range Status   Enterococcus species NOT DETECTED NOT DETECTED Final   Vancomycin resistance NOT DETECTED NOT DETECTED Final   Listeria monocytogenes NOT DETECTED NOT DETECTED Final   Staphylococcus species NOT DETECTED NOT DETECTED Final   Staphylococcus aureus NOT DETECTED NOT DETECTED Final   Methicillin resistance NOT DETECTED NOT DETECTED Final   Streptococcus species NOT DETECTED NOT DETECTED Final   Streptococcus agalactiae NOT DETECTED NOT DETECTED Final   Streptococcus pneumoniae NOT DETECTED NOT DETECTED Final   Streptococcus pyogenes NOT DETECTED NOT DETECTED Final   Acinetobacter baumannii NOT DETECTED NOT DETECTED Final   Enterobacteriaceae species DETECTED (A) NOT DETECTED Final    Comment: CRITICAL RESULT CALLED TO, READ BACK BY AND VERIFIED WITH: CHRISTINE KATSOUDAS 03/13/16 1115 MLM    Enterobacter cloacae complex NOT DETECTED NOT DETECTED Final   Escherichia coli NOT DETECTED NOT DETECTED Final   Klebsiella oxytoca NOT DETECTED NOT DETECTED Final   Klebsiella pneumoniae DETECTED (A) NOT DETECTED Final    Comment: CRITICAL RESULT CALLED TO, READ BACK BY AND VERIFIED WITH: CHRISTINE KATSOUDAS 03/13/16 1115 MLM    Proteus species NOT DETECTED NOT DETECTED Final   Serratia marcescens NOT DETECTED NOT  DETECTED Final   Carbapenem resistance NOT DETECTED NOT DETECTED Final   Haemophilus influenzae NOT DETECTED NOT DETECTED Final   Neisseria meningitidis NOT DETECTED NOT DETECTED Final   Pseudomonas aeruginosa NOT DETECTED NOT DETECTED Final   Candida albicans NOT DETECTED NOT DETECTED Final   Candida glabrata NOT DETECTED NOT DETECTED Final   Candida krusei NOT DETECTED NOT DETECTED Final   Candida parapsilosis NOT DETECTED NOT DETECTED Final   Candida tropicalis NOT DETECTED NOT DETECTED Final    RADIOLOGY:  Dg Chest 2 View  03/12/2016  CLINICAL DATA:  Syncope. EXAM: CHEST  2 VIEW COMPARISON:  September 09, 2014. FINDINGS: The heart size and mediastinal contours are within normal limits. Both lungs are clear. No pneumothorax or pleural effusion is noted. Right internal jugular Port-A-Cath is noted with distal tip in expected position of the SVC. The visualized skeletal structures are unremarkable. IMPRESSION: No active cardiopulmonary disease. Electronically Signed   By: Marijo Conception, M.D.   On: 03/12/2016 16:42   Ct Head Wo Contrast  03/12/2016  CLINICAL DATA:  Syncope. EXAM: CT HEAD WITHOUT CONTRAST TECHNIQUE: Contiguous axial images were obtained from the base of the skull through the vertex without intravenous contrast. COMPARISON:  CT scan of September 07, 2006. FINDINGS: Bony calvarium appears intact. Mild diffuse cortical atrophy is noted. Mild chronic ischemic white matter disease is noted. No mass effect or midline shift is noted. Ventricular size is within normal limits. There is no evidence of mass lesion, hemorrhage or acute infarction. IMPRESSION: Mild diffuse cortical atrophy. Mild chronic ischemic white matter disease. No acute intracranial abnormality seen. Electronically Signed   By: Marijo Conception, M.D.   On: 03/12/2016 16:33    Follow up with PCP in 1 week.  Management plans discussed with the patient, family and they are in agreement.  CODE STATUS:     Code Status  Orders        Start     Ordered   03/14/16 1426  Do not attempt resuscitation (DNR)   Continuous    Question Answer Comment  In the event of cardiac or respiratory ARREST Do  not call a "code blue"   In the event of cardiac or respiratory ARREST Do not perform Intubation, CPR, defibrillation or ACLS   In the event of cardiac or respiratory ARREST Use medication by any route, position, wound care, and other measures to relive pain and suffering. May use oxygen, suction and manual treatment of airway obstruction as needed for comfort.      03/14/16 1425    Code Status History    Date Active Date Inactive Code Status Order ID Comments User Context   03/12/2016  6:52 PM 03/14/2016  2:25 PM Full Code [id number]  Lytle Butte, MD ED   01/04/2016  2:32 PM 01/05/2016  3:27 AM Full Code [id number]  Inez Catalina, MD HOV   12/13/2015  5:04 PM 12/15/2015  8:56 PM Full Code [id number]  Demetrios Loll, MD Inpatient      TOTAL TIME TAKING CARE OF THIS PATIENT ON DAY OF DISCHARGE: more than 30 minutes.   Hillary Bow R M.D on 03/14/2016 at 4:09 PM  Between 7am to 6pm - Pager - [phone]  After 6pm go to [email or web address] - password [email or web address] [facility]  Office  [phone]  CC: Primary care physician; Letta Median, MD  Note: This dictation was prepared with Dragon dictation along with smaller phrase technology. Any transcriptional errors that result from this process are unintentional.

## 2016-03-15 LAB — CULTURE, BLOOD (ROUTINE X 2)

## 2016-03-27 ENCOUNTER — Telehealth: Payer: Self-pay | Admitting: Urology

## 2016-03-27 NOTE — Telephone Encounter (Signed)
Just wanted to give you a heads up about this patient. I spoke with her daughter today and she is in hospice care now and so I have cancelled her appointment for tomorrow. I asked her to keep [place] updated on her.   Thanks  [employer]

## 2016-03-28 ENCOUNTER — Ambulatory Visit: Payer: Medicare Other | Admitting: Urology

## 2016-04-09 ENCOUNTER — Inpatient Hospital Stay

## 2016-04-09 ENCOUNTER — Telehealth: Payer: Self-pay

## 2016-04-09 NOTE — Telephone Encounter (Signed)
  Oncology Nurse Navigator Documentation  Navigator Location: [facility] (04/09/16 1200)                                            Time Spent with Patient: 15 (04/09/16 1200)   Spoke with daughter April. Tonya Moreno is in the hospice home and is expected to pass away any day now. Encouraged  to call if she had any needs going forward.

## 2016-04-26 DEATH — deceased

## 2016-05-09 NOTE — Telephone Encounter (Signed)
x

## 2016-10-10 ENCOUNTER — Other Ambulatory Visit: Payer: Self-pay | Admitting: Nurse Practitioner

## 2017-08-01 IMAGING — PT NM PET TUM IMG INITIAL (PI) SKULL BASE T - THIGH
1 of 10 series · 1 of 25 positions shown · non-contrast
Comparison: 10/24/2015 abdominal pelvic CT.

CLINICAL DATA: Subsequent treatment strategy for T4 cervical
cancer..

EXAM:
NUCLEAR MEDICINE PET SKULL BASE TO THIGH
TECHNIQUE: 10.9 mCi F-18 FDG was injected intravenously. Full-ring PET imaging
was performed from the skull base to thigh after the radiotracer. CT
data was obtained and used for attenuation correction and anatomic
localization.
FASTING BLOOD GLUCOSE:  Value: 82 mg/dl

[Series 3: ct wb 5.0 b30f · axial · 5.0mm · 0.98mm/px · 1 of 290 slices shown]
[im 290/290  brain]
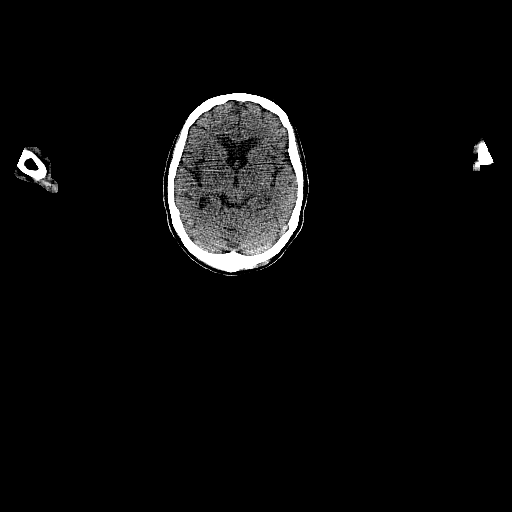

[1 of 25 positions shown; findings below may reference images not displayed]

FINDINGS: NECK

No areas of abnormal hypermetabolism.

CHEST

No areas of abnormal hypermetabolism.

ABDOMEN/PELVIS

Development of hepatic metastasis since 10/24/2015. Index left
hepatic lobe lesion is relatively ill-defined on CT, but a S.U.V.
max of 7.4 on image 128/series 3.

This central right hepatic lobe lesion measures a S.U.V. max of
on image 126/series 3. Other multiple hypo attenuating lesions,
including in the right liver lobe on image 145/series 3, new.

Small retroperitoneal node demonstrates low-level hypermetabolism.
this measures 7 mm and a S.U.V. max of 2.8 on image 155/series 3. 5
mm on the prior diagnostic study.

Locally advanced cervical carcinoma, as evidenced by amorphous
hypermetabolic soft tissue throughout the pelvis. This is increased.
For example, this measures 8.1 x 8.0 cm and a S.U.V. max of 18.8 on
image 207/series 3. Soft tissue density at the same level on the
prior measures on the order of 5.8 x 6.0 cm (when remeasured).

Left obturator node measures 1.5 cm and a S.U.V. max of 6.0 versus 8
mm on the prior diagnostic CT.

SKELETON

No abnormal marrow activity.

CT IMAGES PERFORMED FOR ATTENUATION CORRECTION

No cervical adenopathy. Mild cardiomegaly. Small right pleural
effusion is new. Centrilobular emphysema. Scattered areas of
atelectasis at the lung bases, worse on the right. Nonspecific mild
is pulmonary nodularity, including a subpleural posterior left upper
lobe 2 mm nodule image 96/series 3.

Interval placement of bilateral nephrostomy tubes without residual
hydronephrosis. Colonic stool burden suggests constipation. Ventral
abdominal wall laxity containing nonobstructive transverse colon.
Uterine fibroids. Right hip osteoarthritis.
IMPRESSION: 1. Since 10/24/2015 diagnostic CT, disease progression.
2. New hepatic metastasis.
3. Progression of locally advanced cervical carcinoma, as evidenced
by enlargement of an amorphous hypermetabolic pelvic soft tissue
mass.
4. New or progressive left obturator nodal metastasis. Small
abdominal retroperitoneal node is mildly hypermetabolic and
indeterminate.
5. Placement of bilateral nephrostomy catheters, without
hydronephrosis.
6. New small right pleural effusion.
